# Patient Record
Sex: Female | Born: 1945 | Race: White | Hispanic: No | Marital: Married | Smoking: Former smoker
Health system: Southern US, Community
[De-identification: ages and names within clinical notes are randomized; demographics above are authoritative.]

## PROBLEM LIST (undated history)

## (undated) DIAGNOSIS — K824 Cholesterolosis of gallbladder: Secondary | ICD-10-CM

## (undated) DIAGNOSIS — Z79890 Hormone replacement therapy (postmenopausal): Secondary | ICD-10-CM

## (undated) DIAGNOSIS — J45909 Unspecified asthma, uncomplicated: Secondary | ICD-10-CM

## (undated) DIAGNOSIS — E119 Type 2 diabetes mellitus without complications: Secondary | ICD-10-CM

## (undated) DIAGNOSIS — L930 Discoid lupus erythematosus: Secondary | ICD-10-CM

## (undated) DIAGNOSIS — T7840XA Allergy, unspecified, initial encounter: Secondary | ICD-10-CM

## (undated) DIAGNOSIS — N281 Cyst of kidney, acquired: Secondary | ICD-10-CM

## (undated) DIAGNOSIS — E785 Hyperlipidemia, unspecified: Secondary | ICD-10-CM

## (undated) DIAGNOSIS — F419 Anxiety disorder, unspecified: Secondary | ICD-10-CM

## (undated) DIAGNOSIS — I2699 Other pulmonary embolism without acute cor pulmonale: Secondary | ICD-10-CM

## (undated) DIAGNOSIS — B029 Zoster without complications: Secondary | ICD-10-CM

## (undated) DIAGNOSIS — M5090 Cervical disc disorder, unspecified, unspecified cervical region: Secondary | ICD-10-CM

## (undated) DIAGNOSIS — R591 Generalized enlarged lymph nodes: Secondary | ICD-10-CM

## (undated) DIAGNOSIS — I10 Essential (primary) hypertension: Secondary | ICD-10-CM

## (undated) HISTORY — DX: Essential (primary) hypertension: I10

## (undated) HISTORY — PX: BREAST SURGERY: SHX581

## (undated) HISTORY — PX: APPENDECTOMY: SHX54

## (undated) HISTORY — PX: LAPAROSCOPIC HELLER MYOTOMY: SUR780

## (undated) HISTORY — DX: Cervical disc disorder, unspecified, unspecified cervical region: M50.90

## (undated) HISTORY — DX: Generalized enlarged lymph nodes: R59.1

## (undated) HISTORY — DX: Unspecified asthma, uncomplicated: J45.909

## (undated) HISTORY — DX: Zoster without complications: B02.9

## (undated) HISTORY — PX: TONSILLECTOMY AND ADENOIDECTOMY: SUR1326

## (undated) HISTORY — DX: Hormone replacement therapy: Z79.890

## (undated) HISTORY — DX: Cyst of kidney, acquired: N28.1

## (undated) HISTORY — DX: Hyperlipidemia, unspecified: E78.5

## (undated) HISTORY — PX: NECK SURGERY: SHX720

## (undated) HISTORY — DX: Anxiety disorder, unspecified: F41.9

## (undated) HISTORY — DX: Cholesterolosis of gallbladder: K82.4

## (undated) HISTORY — DX: Other pulmonary embolism without acute cor pulmonale: I26.99

## (undated) HISTORY — DX: Type 2 diabetes mellitus without complications: E11.9

## (undated) HISTORY — DX: Allergy, unspecified, initial encounter: T78.40XA

## (undated) HISTORY — PX: TUBAL LIGATION: SHX77

## (undated) HISTORY — DX: Discoid lupus erythematosus: L93.0

---

## 1997-11-30 ENCOUNTER — Other Ambulatory Visit: Admission: RE | Admit: 1997-11-30 | Discharge: 1997-11-30 | Payer: Self-pay | Admitting: Obstetrics

## 1999-01-10 ENCOUNTER — Other Ambulatory Visit: Admission: RE | Admit: 1999-01-10 | Discharge: 1999-01-10 | Payer: Self-pay | Admitting: Obstetrics

## 1999-12-26 ENCOUNTER — Other Ambulatory Visit: Admission: RE | Admit: 1999-12-26 | Discharge: 1999-12-26 | Payer: Self-pay | Admitting: Obstetrics

## 2002-07-17 HISTORY — PX: SPINE SURGERY: SHX786

## 2003-03-03 ENCOUNTER — Encounter: Payer: Self-pay | Admitting: Emergency Medicine

## 2003-03-03 ENCOUNTER — Ambulatory Visit (HOSPITAL_COMMUNITY): Admission: RE | Admit: 2003-03-03 | Discharge: 2003-03-03 | Payer: Self-pay | Admitting: Emergency Medicine

## 2003-05-27 ENCOUNTER — Inpatient Hospital Stay (HOSPITAL_COMMUNITY): Admission: RE | Admit: 2003-05-27 | Discharge: 2003-05-30 | Payer: Self-pay | Admitting: Neurosurgery

## 2003-06-08 ENCOUNTER — Encounter: Admission: RE | Admit: 2003-06-08 | Discharge: 2003-09-06 | Payer: Self-pay | Admitting: Neurosurgery

## 2005-07-01 ENCOUNTER — Ambulatory Visit (HOSPITAL_COMMUNITY): Admission: RE | Admit: 2005-07-01 | Discharge: 2005-07-01 | Payer: Self-pay | Admitting: Emergency Medicine

## 2005-07-03 ENCOUNTER — Ambulatory Visit: Payer: Self-pay

## 2005-07-19 ENCOUNTER — Ambulatory Visit: Payer: Self-pay | Admitting: *Deleted

## 2005-07-19 ENCOUNTER — Observation Stay (HOSPITAL_COMMUNITY): Admission: AD | Admit: 2005-07-19 | Discharge: 2005-07-20 | Payer: Self-pay | Admitting: *Deleted

## 2008-04-02 ENCOUNTER — Ambulatory Visit (HOSPITAL_COMMUNITY): Admission: RE | Admit: 2008-04-02 | Discharge: 2008-04-02 | Payer: Self-pay | Admitting: Emergency Medicine

## 2008-06-17 ENCOUNTER — Ambulatory Visit (HOSPITAL_COMMUNITY): Admission: RE | Admit: 2008-06-17 | Discharge: 2008-06-17 | Payer: Self-pay | Admitting: Obstetrics

## 2008-07-17 LAB — HM PAP SMEAR: HM Pap smear: NORMAL

## 2009-03-04 ENCOUNTER — Encounter: Admission: RE | Admit: 2009-03-04 | Discharge: 2009-03-04 | Payer: Self-pay | Admitting: Emergency Medicine

## 2009-10-15 LAB — HM MAMMOGRAPHY: HM Mammogram: NORMAL

## 2010-08-08 ENCOUNTER — Encounter: Payer: Self-pay | Admitting: Emergency Medicine

## 2010-09-02 ENCOUNTER — Other Ambulatory Visit: Payer: Self-pay | Admitting: Neurosurgery

## 2010-09-02 DIAGNOSIS — M542 Cervicalgia: Secondary | ICD-10-CM

## 2010-09-05 ENCOUNTER — Ambulatory Visit
Admission: RE | Admit: 2010-09-05 | Discharge: 2010-09-05 | Disposition: A | Discharge status: Home / Self Care | Payer: BC Managed Care – PPO | Source: Ambulatory Visit | Attending: Neurosurgery | Admitting: Neurosurgery

## 2010-09-05 DIAGNOSIS — M542 Cervicalgia: Secondary | ICD-10-CM

## 2010-12-02 NOTE — Discharge Summary (Signed)
Alison Friedman, Alison Friedman NO.:  [id number]   MEDICAL RECORD NO.:  [id number]          PATIENT TYPE:  INP   LOCATION:  3703                         FACILITY:  [facility]   PHYSICIAN:  Rollene Rotunda, M.D.   DATE OF BIRTH:  [birth year]   DATE OF ADMISSION:  07/19/2005  DATE OF DISCHARGE:  07/20/2005                                 DISCHARGE SUMMARY   PRIMARY CARDIOLOGIST:  Cecil Cranker, M.D.   PRIMARY CARE PHYSICIAN:  Brett Canales A. Cleta Alberts, M.D. [facility] on [address] in  [place].   DISCHARGE DIAGNOSES:  Atypical chest pain status post cardiac  catheterization on 07/20/05 by Dr. Antoine Poche.  Cardiac catheterization with  mild coronary plaque, normal left ventricular function, EF with 65% with  normal wall motion.   PAST MEDICAL HISTORY:  1.  Non insulin dependent diabetes.  2.  Restless leg syndrome.  3.  Hyperlipidemia.  4.  Neck and hand surgery.  5.  Lumpectomy.  6.  Cyst removal.  7.  Appendectomy.  8.  Post menopausal.  9.  History of DVT of right lower extremity.   ALLERGIES:  1.  PENICILLIN.  2.  LIPITOR.  3.  MACROBID.  4.  FISH.  5.  IVP DYE.   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   PROCEDURES THIS ADMISSION:  Cardiac catheterization.  Results as stated  above.   HOSPITAL COURSE:  [name] is a 65 year old Caucasian  female no known  coronary artery disease with diabetes, high blood pressure, hyperlipidemia  with history of tachycardia and diaphoresis, presyncopal episode with slight  chest discomfort who recently had a gated stress Myoview and developed chest  pain with diffuse ST depression.  The patient was seen by Dr. Glennon Hamilton  for evaluation of chest pain scheduled for outpatient cardiac  catheterization.  Results as stated above.  The patient tolerated the  procedure without complication.  Premedicated for IVP dye allergy.  The  patient is currently on bedrest status post cardiac catheterization.  If  no  complications from cath site and the patient ambulates after bedrest, the  patient will be discharged home.  Follow up with Dr. Cleta Alberts for a post cath  check and primary care issues.   MEDICATIONS AT DISCHARGE:  The patient was instructed to continue her  previous medications which include:  1.  Vytorin 10/20 mg p.o. daily.  2.  Xanax 0.5 mg p.o. p.r.n.  3.  Avandia 4 mg daily.  4.  Metformin 500 mg daily.  The patient was instructed to resume her      Metformin on 07/22/05.  5.  Zoloft 50 mg daily.  6.  Micardis HCT 40/12.5 mg p.o. daily.  7.  The patient has also been started on aspirin, enteric coated 325 mg      daily.  8.  Toprol XL 25 mg daily.   She is to initiate an exercise program and weight loss program for risk  factor modification.  Follow up with Dr. Cleta Alberts next week.  Continue her  diabetic diet.  Discharge instructions after  cardiac catheterization.  I  have also given the patient an article on cardiovascular disease in a  diabetic patient just for risk modifications.   LABORATORIES:  Lab work prior to discharge:  Sodium 4.0, BUN 14, creatinine  1.0.  H and H 11.2, hematocrit 32.7.   Duration of discharge 30 minutes.      Dorian Pod, NP    ______________________________  Rollene Rotunda, M.D.    MB/MEDQ  D:  07/20/2005  T:  07/20/2005  Job:  [id number]   cc:   Rollene Rotunda, M.D.  [address]  [address]  [place]  [place] [address]   [place]. Cleta Alberts, M.D.  Fax: [phone]

## 2010-12-02 NOTE — Cardiovascular Report (Signed)
NAMERHILYNN, PREYER NO.:  [id number]   MEDICAL RECORD NO.:  [id number]          PATIENT TYPE:  INP   LOCATION:  3703                         FACILITY:  [facility]   PHYSICIAN:  Rollene Rotunda, M.D.   DATE OF BIRTH:  [birth year]   DATE OF PROCEDURE:  07/20/2005  DATE OF DISCHARGE:                              CARDIAC CATHETERIZATION   PRIMARY CARE PHYSICIAN:  Dr. Lucilla Edin.   CARDIOLOGIST:  Cecil Cranker, M.D.   PROCEDURES:  Left heart catheterization, coronary arteriography.   INDICATIONS:  Patient with chest pain and Cardiolite suggesting anterior  ischemia.   PROCEDURES:  Note left heart catheterization performed via the right femoral  artery.  The artery was cannulated using arterial puncture.  A #6-French  arterial sheath was inserted via the modified Seldinger technique.  Preformed Judkins with a pigtail catheter were utilized.  The patient  tolerated the procedure well and left the lab in stable condition with good  results.   HEMODYNAMIC DATA:  LV 112/9, AL 112/85.   CORONARIES:  The left main was normal.  The LAD had mid 25% stenosis.  The  first diagonal was moderate sized and normal.  Second diagonal was moderate  size and normal.  The circumflex and the AV groove was normal.  OM1 and OM2  were small and normal.  OM3 was large and old.  The OM4 was moderate size  and normal.  Right coronary artery was large and dominant.  There were  diffuse liminal irregularities.  The PDA was large and normal.  Posterolateral x2 was moderate size and normal.   LEFT VENTRICULOGRAM:  The left ventriculogram was obtained in the RAO  projection.  EF was 65% with normal wall motion.   CONCLUSION:  Mild coronary plaque.  Normal left ventricular function.   PLAN:  No further cardiac workup is suggested.  The patient will follow with  Dr. Cleta Alberts for evaluation of nonanginal chest discomfort.           ______________________________  Rollene Rotunda,  M.D.     JH/MEDQ  D:  07/20/2005  T:  07/20/2005  Job:  [id number]   cc:   Brett Canales A. Cleta Alberts, M.D.  Fax: [phone]   E. Graceann Congress, M.D.  [address]  [address]  [place]  [place] [address]

## 2010-12-16 DIAGNOSIS — B029 Zoster without complications: Secondary | ICD-10-CM

## 2010-12-16 HISTORY — DX: Zoster without complications: B02.9

## 2011-02-25 LAB — HM DIABETES FOOT EXAM

## 2011-04-26 LAB — LIPID PANEL
Cholesterol: 234 mg/dL — AB (ref 0–200)
HDL: 37 mg/dL (ref 35–70)
LDL Cholesterol: 141 mg/dL
LDl/HDL Ratio: 6.3
Triglycerides: 278 mg/dL — AB (ref 40–160)

## 2011-04-26 LAB — BASIC METABOLIC PANEL: Glucose: 140 mg/dL

## 2011-04-26 LAB — HEMOGLOBIN A1C: Hgb A1c MFr Bld: 6.5 % — AB (ref 4.0–6.0)

## 2011-05-08 ENCOUNTER — Emergency Department (HOSPITAL_COMMUNITY): Payer: Medicare Other

## 2011-05-08 ENCOUNTER — Inpatient Hospital Stay (HOSPITAL_COMMUNITY)
Admission: EM | Admit: 2011-05-08 | Discharge: 2011-05-11 | DRG: 394 | Disposition: A | Discharge status: Home / Self Care | Payer: Medicare Other | Attending: Internal Medicine | Admitting: Internal Medicine

## 2011-05-08 DIAGNOSIS — G2581 Restless legs syndrome: Secondary | ICD-10-CM | POA: Diagnosis present

## 2011-05-08 DIAGNOSIS — K559 Vascular disorder of intestine, unspecified: Principal | ICD-10-CM | POA: Diagnosis present

## 2011-05-08 DIAGNOSIS — I10 Essential (primary) hypertension: Secondary | ICD-10-CM | POA: Diagnosis present

## 2011-05-08 DIAGNOSIS — E785 Hyperlipidemia, unspecified: Secondary | ICD-10-CM | POA: Diagnosis present

## 2011-05-08 DIAGNOSIS — D62 Acute posthemorrhagic anemia: Secondary | ICD-10-CM | POA: Diagnosis present

## 2011-05-08 DIAGNOSIS — I2510 Atherosclerotic heart disease of native coronary artery without angina pectoris: Secondary | ICD-10-CM | POA: Diagnosis present

## 2011-05-08 DIAGNOSIS — E119 Type 2 diabetes mellitus without complications: Secondary | ICD-10-CM | POA: Diagnosis present

## 2011-05-08 LAB — URINALYSIS, ROUTINE W REFLEX MICROSCOPIC
Bilirubin Urine: NEGATIVE
Glucose, UA: NEGATIVE mg/dL
Hgb urine dipstick: NEGATIVE
Ketones, ur: NEGATIVE mg/dL
Leukocytes, UA: NEGATIVE
Nitrite: NEGATIVE
Protein, ur: NEGATIVE mg/dL
Specific Gravity, Urine: 1.011 (ref 1.005–1.030)
Urobilinogen, UA: 0.2 mg/dL (ref 0.0–1.0)
pH: 5 (ref 5.0–8.0)

## 2011-05-08 LAB — DIFFERENTIAL
Basophils Absolute: 0 10*3/uL (ref 0.0–0.1)
Basophils Relative: 0 % (ref 0–1)
Eosinophils Absolute: 0.1 10*3/uL (ref 0.0–0.7)
Eosinophils Relative: 1 % (ref 0–5)
Lymphocytes Relative: 13 % (ref 12–46)
Lymphs Abs: 1.9 10*3/uL (ref 0.7–4.0)
Monocytes Absolute: 1.6 10*3/uL — ABNORMAL HIGH (ref 0.1–1.0)
Monocytes Relative: 11 % (ref 3–12)
Neutro Abs: 10.5 10*3/uL — ABNORMAL HIGH (ref 1.7–7.7)
Neutrophils Relative %: 75 % (ref 43–77)

## 2011-05-08 LAB — GLUCOSE, CAPILLARY: Glucose-Capillary: 151 mg/dL — ABNORMAL HIGH (ref 70–99)

## 2011-05-08 LAB — CBC
HCT: 42.1 % (ref 36.0–46.0)
Hemoglobin: 14.3 g/dL (ref 12.0–15.0)
MCH: 31 pg (ref 26.0–34.0)
MCHC: 34 g/dL (ref 30.0–36.0)
MCV: 91.1 fL (ref 78.0–100.0)
Platelets: 360 10*3/uL (ref 150–400)
RBC: 4.62 MIL/uL (ref 3.87–5.11)
RDW: 12.9 % (ref 11.5–15.5)
WBC: 14 10*3/uL — ABNORMAL HIGH (ref 4.0–10.5)

## 2011-05-08 LAB — COMPREHENSIVE METABOLIC PANEL
ALT: 17 U/L (ref 0–35)
AST: 17 U/L (ref 0–37)
Albumin: 4.5 g/dL (ref 3.5–5.2)
Alkaline Phosphatase: 124 U/L — ABNORMAL HIGH (ref 39–117)
BUN: 17 mg/dL (ref 6–23)
CO2: 24 mEq/L (ref 19–32)
Calcium: 10.1 mg/dL (ref 8.4–10.5)
Chloride: 98 mEq/L (ref 96–112)
Creatinine, Ser: 0.88 mg/dL (ref 0.50–1.10)
GFR calc Af Amer: 78 mL/min — ABNORMAL LOW (ref >90)
GFR calc non Af Amer: 67 mL/min — ABNORMAL LOW (ref >90)
Glucose, Bld: 142 mg/dL — ABNORMAL HIGH (ref 70–99)
Potassium: 4 mEq/L (ref 3.5–5.1)
Sodium: 135 mEq/L (ref 135–145)
Total Bilirubin: 0.9 mg/dL (ref 0.3–1.2)
Total Protein: 8.1 g/dL (ref 6.0–8.3)

## 2011-05-08 LAB — ABO/RH: ABO/RH(D): O NEG

## 2011-05-08 LAB — OCCULT BLOOD, POC DEVICE: Fecal Occult Bld: POSITIVE

## 2011-05-08 NOTE — H&P (Unsigned)
Alison Friedman, Alison Friedman NO.:  [id number]  MEDICAL RECORD NO.:  [id number]  LOCATION:  WLED                         FACILITY:  [facility]  PHYSICIAN:  Valetta Close, M.D.   DATE OF BIRTH:  [birth year]  DATE OF ADMISSION:  05/08/2011 DATE OF DISCHARGE:                             HISTORY & PHYSICAL   CHIEF COMPLAINT:  Rectal bleeding and abdominal pain.  HISTORY OF PRESENT ILLNESS:  This is a 65 year old with a past medical history of diabetes, hypertension, and nonobstructive coronary artery disease who went to dinner last night with some friends and shortly after finishing dinner developed severe abdominal cramps, diaphoresis, and heavy episode of diarrhea.  She had diarrhea promptly after dinner, and she tried to go home, but could only make as far as her daughter's house where she continued to have diarrhea.  She had 3 more episodes there, continued to feel weak, went home, and then had diarrhea at home since 2 in the morning, but the diarrhea is now completely bloody. There are also some big clots in the stool.  She also continues to have left-sided abdominal pain with nausea, but no vomiting.  She has had subjective fevers and chills.  Nobody else who has eaten the same food has had any symptoms.  She is generally constipated; this the first time she has had a problem like this.  PAST MEDICAL HISTORY:  Diabetes on oral medications, hypertension, coronary artery disease that was deemed nonobstructive on a cath in 2007, restless leg syndrome, hyperlipidemia, a remote history of a right lower extremity DVT, and she is status post appendectomy.  SOCIAL HISTORY:  She is married.  No current tobacco and no drugs, though she is a remote smoker.  ALLERGIES:  SHE HAS ALLERGIES TO PENICILLIN, MACROBID, SHELLFISH, IV DYE, AND LIPITOR, BUT SHE HAS TOLERATED VYTORIN IN THE PAST.  CODE STATUS:  She is a full code.  FAMILY HISTORY:  Negative for colon cancer or early heart  disease.  REVIEW OF SYSTEMS:  Ten point review of systems was performed and negative except for as per the HPI.  She had her last colonoscopy 5 years ago and had some polyps removed.  She is scheduled to have another one soon.  PHYSICAL EXAMINATION:  VITAL SIGNS:  Temperature 99.4, blood pressure 120/64, heart rate 67, O2 saturation 97% on room air, and respiratory rate is 18. GENERAL:  She appears her stated age and is in maybe some mild distress. HEENT:  Eyes are anicteric.  She has moist oral mucosa. NECK:  No JVD.  No carotid bruits. LUNGS:  Clear to auscultation bilaterally. CARDIAC EXAMINATION:  Regular rate and rhythm.  No murmur. ABDOMEN:  Bowel sounds hyperactive.  She has moderate tenderness to palpation in the left upper quadrant with no rigidity, no rebound tenderness, and no guarding. EXTREMITIES:  No edema.  She has no CVA tenderness. RECTAL:  Exam is deferred.  It was done by the ER physician and was guaiac positive. EXTREMITIES:  No edema. NEUROLOGIC:  She is alert and oriented x3.  Cranial nerves 2 through 12 intact. PSYCHIATRIC:  Nonfocal.  LABORATORY DATA:  White count 14, hemoglobin 14, hematocrit 42, platelets  360, and MCV 91.  A UA is negative.  Sodium 135, potassium 4, chloride 98, bicarb 24, BUN 17, creatinine 0.88, glucose 142, calcium 10.1, total protein is 8, albumin 4.5, AST 17, ALT 17, alk phos 124, and bilirubin 0.9.  A CT shows left colon colitis from the splenic flexure to the mid sigmoid consistent with either infectious or ischemic colitis.  ASSESSMENT AND PLAN: 1. Colitis.  The question is whether this is infectious or ischemic.     I will treat with IV Cipro and Flagyl, pain and nausea medications,     IV fluids, and supportive care.  We will see how she is doing     tomorrow and advance diet as tolerated.  Based on how she     progresses, we will determine whether we need to consult GI to do     colonoscopy inpatient, or to do it in the  near future.  I will keep     her on aspirin in case this is ischemic colitis, and I have decided     to put her on telemetry because atrial fibrillation could be a     cause of ischemic colitis, so I will make sure she is not having     any paroxysmally atrial fibrillation. 2. Diabetes.  I am going to hold her medications for now and put her     on a sensitive sliding scale given that she is not eating.     Medications she takes Micardis, aspirin, Januvia, and metformin.  Approximate length of time spent on this admission was approximately 40 minutes.     Valetta Close, M.D.     JC/MEDQ  D:  05/08/2011  T:  05/08/2011  Job:  [id number]  cc:   Dr. __________ [name]  Dr. Rhetta Mura

## 2011-05-09 LAB — CBC
HCT: 35.3 % — ABNORMAL LOW (ref 36.0–46.0)
Hemoglobin: 11.5 g/dL — ABNORMAL LOW (ref 12.0–15.0)
MCHC: 32.6 g/dL (ref 30.0–36.0)
RBC: 3.8 MIL/uL — ABNORMAL LOW (ref 3.87–5.11)
WBC: 12.2 10*3/uL — ABNORMAL HIGH (ref 4.0–10.5)

## 2011-05-09 LAB — BASIC METABOLIC PANEL
Chloride: 104 mEq/L (ref 96–112)
GFR calc Af Amer: 84 mL/min — ABNORMAL LOW (ref >90)
GFR calc non Af Amer: 72 mL/min — ABNORMAL LOW (ref >90)
Potassium: 3.7 mEq/L (ref 3.5–5.1)
Sodium: 136 mEq/L (ref 135–145)

## 2011-05-09 LAB — LIPID PANEL
Cholesterol: 156 mg/dL (ref 0–200)
HDL: 37 mg/dL — ABNORMAL LOW (ref >39)
LDL Cholesterol: 87 mg/dL (ref 0–99)
Triglycerides: 158 mg/dL — ABNORMAL HIGH (ref <150)

## 2011-05-09 LAB — FERRITIN: Ferritin: 55 ng/mL (ref 10–291)

## 2011-05-09 LAB — SEDIMENTATION RATE: Sed Rate: 20 mm/hr (ref 0–22)

## 2011-05-09 LAB — VITAMIN B12: Vitamin B-12: 266 pg/mL (ref 211–911)

## 2011-05-09 LAB — GLUCOSE, CAPILLARY
Glucose-Capillary: 106 mg/dL — ABNORMAL HIGH (ref 70–99)
Glucose-Capillary: 111 mg/dL — ABNORMAL HIGH (ref 70–99)
Glucose-Capillary: 128 mg/dL — ABNORMAL HIGH (ref 70–99)
Glucose-Capillary: 145 mg/dL — ABNORMAL HIGH (ref 70–99)
Glucose-Capillary: 167 mg/dL — ABNORMAL HIGH (ref 70–99)

## 2011-05-10 LAB — BASIC METABOLIC PANEL
CO2: 24 mEq/L (ref 19–32)
Calcium: 8.7 mg/dL (ref 8.4–10.5)
Creatinine, Ser: 0.86 mg/dL (ref 0.50–1.10)
GFR calc non Af Amer: 69 mL/min — ABNORMAL LOW (ref >90)
Glucose, Bld: 154 mg/dL — ABNORMAL HIGH (ref 70–99)

## 2011-05-10 LAB — CBC
MCH: 30.3 pg (ref 26.0–34.0)
MCHC: 32.7 g/dL (ref 30.0–36.0)
MCV: 92.7 fL (ref 78.0–100.0)
Platelets: 283 10*3/uL (ref 150–400)
RBC: 3.7 MIL/uL — ABNORMAL LOW (ref 3.87–5.11)

## 2011-05-10 LAB — GLUCOSE, CAPILLARY: Glucose-Capillary: 94 mg/dL (ref 70–99)

## 2011-05-11 LAB — CBC
Platelets: 276 10*3/uL (ref 150–400)
RBC: 3.57 MIL/uL — ABNORMAL LOW (ref 3.87–5.11)
RDW: 12.8 % (ref 11.5–15.5)
WBC: 8.7 10*3/uL (ref 4.0–10.5)

## 2011-05-11 LAB — DIFFERENTIAL
Eosinophils Absolute: 0.7 10*3/uL (ref 0.0–0.7)
Lymphocytes Relative: 35 % (ref 12–46)
Monocytes Absolute: 0.8 10*3/uL (ref 0.1–1.0)
Neutrophils Relative %: 48 % (ref 43–77)

## 2011-05-11 LAB — BASIC METABOLIC PANEL
Chloride: 107 mEq/L (ref 96–112)
GFR calc Af Amer: 83 mL/min — ABNORMAL LOW (ref >90)
Potassium: 3.8 mEq/L (ref 3.5–5.1)
Sodium: 139 mEq/L (ref 135–145)

## 2011-05-11 LAB — MAGNESIUM: Magnesium: 1.9 mg/dL (ref 1.5–2.5)

## 2011-05-11 LAB — GLUCOSE, CAPILLARY
Glucose-Capillary: 120 mg/dL — ABNORMAL HIGH (ref 70–99)
Glucose-Capillary: 116 mg/dL — ABNORMAL HIGH (ref 70–99)

## 2011-05-12 LAB — TYPE AND SCREEN
ABO/RH(D): O NEG
Antibody Screen: NEGATIVE
Unit division: 0

## 2011-05-13 NOTE — Discharge Summary (Signed)
[name], BLOK NO.:  [id number]  MEDICAL RECORD NO.:  [id number]  LOCATION:  1428                         FACILITY:  [facility]  PHYSICIAN:  Ramiro Harvest, MD    DATE OF BIRTH:  Aug 25, 1945  DATE OF ADMISSION:  05/08/2011 DATE OF DISCHARGE:  05/11/2011                        DISCHARGE SUMMARY    PRIMARY CARE PHYSICIAN:  Brett Canales A. Cleta Alberts, M.D. of [facility].  GASTROENTEROLOGIST:  Dr. Janna [facility] [place].  DISCHARGE DIAGNOSES: 1. Ischemic colitis, improved. 2. Acute blood loss anemia secondary to ischemic colitis, resolved. 3. Well-controlled type 2 diabetes with a hemoglobin A1c of 6.6. 4. Hypertension. 5. History of nonobstructive coronary artery disease. 6. Restless legs syndrome. 7. Hyperlipidemia. 8. Remote history of right lower extremity deep venous thrombosis. 9. Status post appendectomy.  DISCHARGE MEDICATIONS: 1. Ciprofloxacin 500 mg p.o. b.i.d. x12 days. 2. Flagyl 500 mg p.o. t.i.d. x12 days. 3. Colace 100 mg p.o. b.i.d. 4. Oxycodone 5 mg p.o. q.4 hours p.r.n. pain. 5. Zofran 4 mg p.o. q.6 hours p.r.n. 6. Januvia 100 mg p.o. daily. 7. Metformin XR 500 mg p.o. b.i.d. 8. Pravastatin 40 mg p.o. daily. 9. Vitamin D3 of 2000 international units p.o. daily.  DISPOSITION AND FOLLOWUP:  The patient will be discharged home.  The patient is to follow up with PCP in 1 week.  On follow up, the patient's blood pressure will need to be reassessed.  The patient's blood pressure medications were held during the hospitalization secondary to borderline hypotension and secondary to ischemic colitis and as such this was discontinued on discharge.  This need to be reassessed per PCP and decided whether to resume.  The patient will need a BMET on followup. The patient is also to follow up with her gastroenterologist 1 week post discharge for followup on her ischemic colitis.  The patient has been discharged  on 12 more days of ciprofloxacin and Flagyl to complete a 2- week course of antibiotic therapy.  The patient may benefit from a colonoscopy post treatment.  When the patient follows with Dr. Rhetta Mura, the patient's aspirin has been discontinued.  It will be deferred to her gastroenterologist as to when to resume the aspirin.  CONSULTATIONS DONE:  None.  PROCEDURES PERFORMED:  CT of the abdomen and pelvis done on May 08, 2011, shows a long segment colitis involving the left colon from the splenic flexure to the midsigmoid.  Terminal ileum and rectum appear uninvolved.  This appearance is most suggestive of an infectious or ischemic colitis.  No free air.  BRIEF ADMISSION HISTORY AND PHYSICAL:  [name] is a 65 year old Caucasian female with past medical history of diabetes, hypertension, nonobstructive coronary artery disease, who went to dinner the night prior to admission with some friends and shortly after finishing the dinner, developed some severe abdominal cramps, diaphoresis, and heavy episodes of diarrhea.  The patient had diarrhea promptly after dinner. She tried to go home, but could only make it as far as her daughter's house where she continued to have diarrhea.  She had 3 more episodes, then continued to feel weak and went home and then had diarrhea at home. Since 2:00  a.m. on the morning of admission, the patient's diarrhea had completely become bloody.  The patient also had some big clots in the stool.  She also continued to have some left-sided abdominal pain with nausea, but no vomiting.  The patient had some subjective fevers and chills.  Nobody else who had eaten the same meal as the patient did had any symptoms.  The patient states she is generally constipated and for the first time that she has had a problem like this.  For the rest of admission history and physical, please see H and P dictated by Dr. Noel Gerold of job [phone].  HOSPITAL COURSE: 1. Ischemic  colitis.  The patient was admitted with colitis.  It was     felt differential included infectious versus ischemic colitis.  The     patient was placed empirically on IV Cipro and Flagyl as well as IV     fluids, antiemetics, and supportive care.  The patient was     monitored.  The patient did not have any further bloody stools     during the hospitalization, nor did she have any further diarrhea.     Stool studies were sent, but are pending as the patient had no     stools.  C. difficile PCR was also sent, however, the patient did     not have any further stools during the hospitalization.  As such,     results of that were unobtainable.  The patient improved, however,     clinically.  Abdominal pain improved.  She was transitioned from     NPO to clear liquids and subsequently transitioned to a diet, which     she tolerated.  Her abdominal pain improved, did not have any     emesis and by day of discharge, the patient was in stable and     improved condition.  The patient will be discharged home on 12 more     days of oral ciprofloxacin and Flagyl to complete a 2-week course     of antibiotic therapy.  The patient will be discharged in stable     and improved condition.  The patient's aspirin was discontinued and     resumption of aspirin will be deferred to her gastroenterologist. 2. Acute blood loss anemia secondary to ischemic colitis. 3. Hypertension.  On admission, the patient was noted to have a     borderline blood pressure and as such, her blood pressure     medication was not restarted during this hospitalization.  The     patient's blood pressure medications were held.  Her systolic blood     pressure remained slightly in the low 100s.  The patient, however,     was asymptomatic.  The patient will be discharged home off     antihypertensive medications.  She will need to follow up with her     PCP.  We will reassess her blood pressure regimen and decide as to     when to  resume her blood pressure regimen.  The patient will be     discharged in stable and improved condition. 4. On the day of discharge, vital signs:  temperature 97.6, pulse of     51, respirations 18, blood pressure 116/69, satting 99% on room     air.  DISCHARGE LABS:  CBC:  White count 8.7, hemoglobin 10.9, hematocrit 33.8, platelet count of 276 with an ANC of 4.2, magnesium level of 1.9. BMET:  Sodium 139,  potassium 3.8, chloride 107, bicarb 25, glucose 134, BUN 14, creatinine 0.84, and a calcium of 8.8.  It was a pleasure taking care of Ms. Humble.     Ramiro Harvest, MD     DT/MEDQ  D:  05/11/2011  T:  05/11/2011  Job:  [id number]  cc:   Brett Canales A. Cleta Alberts, M.D. Fax: [phone]  Dr. Janna Arch  Electronically Signed by Ramiro Harvest MD on 05/13/2011 05:28:10 PM

## 2011-05-15 LAB — CULTURE, BLOOD (ROUTINE X 2)
Culture  Setup Time: 201210230842
Culture: NO GROWTH

## 2011-06-29 ENCOUNTER — Ambulatory Visit (INDEPENDENT_AMBULATORY_CARE_PROVIDER_SITE_OTHER): Payer: Medicare Other

## 2011-06-29 DIAGNOSIS — K5190 Ulcerative colitis, unspecified, without complications: Secondary | ICD-10-CM

## 2011-07-24 DIAGNOSIS — D126 Benign neoplasm of colon, unspecified: Secondary | ICD-10-CM | POA: Diagnosis not present

## 2011-07-24 DIAGNOSIS — Z86010 Personal history of colonic polyps: Secondary | ICD-10-CM | POA: Diagnosis not present

## 2011-07-24 DIAGNOSIS — K55059 Acute (reversible) ischemia of intestine, part and extent unspecified: Secondary | ICD-10-CM | POA: Diagnosis not present

## 2011-08-05 ENCOUNTER — Encounter: Payer: Self-pay | Admitting: Emergency Medicine

## 2011-08-22 ENCOUNTER — Encounter: Payer: Self-pay | Admitting: *Deleted

## 2011-08-22 DIAGNOSIS — J45909 Unspecified asthma, uncomplicated: Secondary | ICD-10-CM | POA: Insufficient documentation

## 2011-08-22 DIAGNOSIS — E785 Hyperlipidemia, unspecified: Secondary | ICD-10-CM | POA: Insufficient documentation

## 2011-08-22 DIAGNOSIS — F419 Anxiety disorder, unspecified: Secondary | ICD-10-CM | POA: Insufficient documentation

## 2011-08-22 DIAGNOSIS — E119 Type 2 diabetes mellitus without complications: Secondary | ICD-10-CM | POA: Insufficient documentation

## 2011-08-22 DIAGNOSIS — I10 Essential (primary) hypertension: Secondary | ICD-10-CM | POA: Insufficient documentation

## 2011-09-05 ENCOUNTER — Ambulatory Visit: Payer: Self-pay | Admitting: Emergency Medicine

## 2011-10-01 ENCOUNTER — Ambulatory Visit (INDEPENDENT_AMBULATORY_CARE_PROVIDER_SITE_OTHER): Payer: Medicare Other | Admitting: Emergency Medicine

## 2011-10-01 DIAGNOSIS — E119 Type 2 diabetes mellitus without complications: Secondary | ICD-10-CM | POA: Diagnosis not present

## 2011-10-01 DIAGNOSIS — R05 Cough: Secondary | ICD-10-CM | POA: Diagnosis not present

## 2011-10-01 DIAGNOSIS — J45909 Unspecified asthma, uncomplicated: Secondary | ICD-10-CM

## 2011-10-01 DIAGNOSIS — R059 Cough, unspecified: Secondary | ICD-10-CM

## 2011-10-01 DIAGNOSIS — IMO0001 Reserved for inherently not codable concepts without codable children: Secondary | ICD-10-CM | POA: Diagnosis not present

## 2011-10-01 DIAGNOSIS — J329 Chronic sinusitis, unspecified: Secondary | ICD-10-CM

## 2011-10-01 DIAGNOSIS — R5382 Chronic fatigue, unspecified: Secondary | ICD-10-CM | POA: Diagnosis not present

## 2011-10-01 MED ORDER — HYDROCOD POLST-CHLORPHEN POLST 10-8 MG/5ML PO LQCR: 5.0000 mL | Freq: Two times a day (BID) | ORAL | Status: DC | PRN

## 2011-10-01 MED ORDER — IPRATROPIUM BROMIDE 0.02 % IN SOLN
0.5000 mg | Freq: Once | RESPIRATORY_TRACT | Status: AC
  Administered 2011-10-01: 0.5 mg via RESPIRATORY_TRACT

## 2011-10-01 MED ORDER — CEPHALEXIN 500 MG PO TABS: 500.0000 mg | ORAL_TABLET | Freq: Three times a day (TID) | ORAL | Status: AC

## 2011-10-01 MED ORDER — FLUTICASONE PROPIONATE 50 MCG/ACT NA SUSP: 2.0000 | Freq: Every day | NASAL | Status: DC

## 2011-10-01 MED ORDER — ALBUTEROL SULFATE (2.5 MG/3ML) 0.083% IN NEBU
2.5000 mg | INHALATION_SOLUTION | Freq: Once | RESPIRATORY_TRACT | Status: AC
  Administered 2011-10-01: 2.5 mg via RESPIRATORY_TRACT

## 2011-10-01 MED ORDER — ALBUTEROL SULFATE HFA 108 (90 BASE) MCG/ACT IN AERS: 2.0000 | INHALATION_SPRAY | RESPIRATORY_TRACT | Status: DC | PRN

## 2011-10-02 NOTE — Progress Notes (Signed)
  Subjective:    Patient ID: Alison Friedman, female    DOB: 04-Sep-1945, 66 y.o.   MRN: [id number]  HPI patient presents with head congestion sore throat and cough. She has a history of allergies and reactive airways disease. She has presented in the past with acute airway issues and required nebs in the past.    Review of Systems other medical issues are currently under treatment and control this as an acute illness for her     Objective:   Physical Exam  Constitutional: She appears well-developed.  HENT:  Right Ear: External ear normal.  Left Ear: External ear normal.  Mouth/Throat: Oropharynx is clear and moist.       The nose is congested. There is purulent nasal drainage.  Eyes: Pupils are equal, round, and reactive to light.  Neck: No thyromegaly present.       There is a 1 x 1 cm right posterior cervical node  Cardiovascular: Normal rate and regular rhythm.   Pulmonary/Chest: No respiratory distress. She has wheezes. She has rales. She exhibits no tenderness.  Abdominal: Soft.          Assessment & Plan:   Assessment is allergic rhinitis with secondary sinusitis and reactive airways disease. Reglan given neb treatment and then get her on antibiotics and nasal sprays to help with her allergy symptoms

## 2011-10-10 ENCOUNTER — Ambulatory Visit (INDEPENDENT_AMBULATORY_CARE_PROVIDER_SITE_OTHER): Payer: Medicare Other | Admitting: Emergency Medicine

## 2011-10-10 DIAGNOSIS — R5382 Chronic fatigue, unspecified: Secondary | ICD-10-CM | POA: Diagnosis not present

## 2011-10-10 DIAGNOSIS — G9332 Myalgic encephalomyelitis/chronic fatigue syndrome: Secondary | ICD-10-CM

## 2011-10-10 DIAGNOSIS — E119 Type 2 diabetes mellitus without complications: Secondary | ICD-10-CM

## 2011-10-10 DIAGNOSIS — J45909 Unspecified asthma, uncomplicated: Secondary | ICD-10-CM | POA: Diagnosis not present

## 2011-10-10 DIAGNOSIS — J309 Allergic rhinitis, unspecified: Secondary | ICD-10-CM | POA: Insufficient documentation

## 2011-10-10 DIAGNOSIS — R05 Cough: Secondary | ICD-10-CM | POA: Diagnosis not present

## 2011-10-10 DIAGNOSIS — R059 Cough, unspecified: Secondary | ICD-10-CM | POA: Diagnosis not present

## 2011-10-10 DIAGNOSIS — IMO0001 Reserved for inherently not codable concepts without codable children: Secondary | ICD-10-CM | POA: Diagnosis not present

## 2011-10-10 DIAGNOSIS — M791 Myalgia, unspecified site: Secondary | ICD-10-CM

## 2011-10-10 DIAGNOSIS — E785 Hyperlipidemia, unspecified: Secondary | ICD-10-CM | POA: Diagnosis not present

## 2011-10-10 LAB — POCT SEDIMENTATION RATE: POCT SED RATE: 11 mm/hr (ref 0–22)

## 2011-10-10 MED ORDER — HYDROCODONE-ACETAMINOPHEN 5-325 MG PO TABS: 1.0000 | ORAL_TABLET | Freq: Four times a day (QID) | ORAL | Status: AC | PRN

## 2011-10-10 MED ORDER — OLOPATADINE HCL 0.6 % NA SOLN: NASAL | Status: DC

## 2011-10-10 NOTE — Progress Notes (Signed)
  Subjective:    Patient ID: Alison Friedman, female    DOB: 09/21/1945, 66 y.o.   MRN: [id number] Patient patient in for followup of her diabetes she overall is doing well except for pain in her thighs. She feels like the metformin is what is causing her discomfort. She's had a recurrence of her allergy symptoms. She feels her skin or voice. She's not had any cough. She seems to be recovering from her recent respiratory illness. HPI    Review of Systems no chest pain no shortness of breath. No bowel symptoms. No GU symptoms. She also has some aching in her legs as she assumed was secondary to diabetes drugs but they have persisted despite not taking them.     Objective:   Physical Exam  Constitutional: She appears well-nourished.  HENT:  Head: Normocephalic and atraumatic.  Right Ear: External ear normal.  Left Ear: External ear normal.  Mouth/Throat: Oropharynx is clear and moist.       She has a husky voice          Assessment & Plan:   Patient states she is having trouble with the metformin. We'll see what her hemoglobin A1c and glucose are make a decision about medications at that time. Also gave her a limited prescription of hydrocodone to take for leg pain or cough

## 2011-10-11 LAB — LIPID PANEL
LDL Cholesterol: 127 mg/dL — ABNORMAL HIGH (ref 0–99)
Triglycerides: 260 mg/dL — ABNORMAL HIGH (ref <150)
VLDL: 52 mg/dL — ABNORMAL HIGH (ref 0–40)

## 2011-10-11 LAB — CK: Total CK: 68 U/L (ref 7–177)

## 2011-10-12 LAB — ALDOLASE: Aldolase: 6 U/L (ref <=8.1)

## 2011-11-10 DIAGNOSIS — N6489 Other specified disorders of breast: Secondary | ICD-10-CM | POA: Diagnosis not present

## 2011-11-10 LAB — HM MAMMOGRAPHY

## 2012-01-23 ENCOUNTER — Ambulatory Visit (INDEPENDENT_AMBULATORY_CARE_PROVIDER_SITE_OTHER): Payer: Medicare Other | Admitting: Emergency Medicine

## 2012-01-23 ENCOUNTER — Encounter: Payer: Self-pay | Admitting: Emergency Medicine

## 2012-01-23 VITALS — BP 138/79 | HR 57 | Temp 97.5°F | Resp 16 | Ht 63.0 in | Wt 174.4 lb

## 2012-01-23 DIAGNOSIS — I10 Essential (primary) hypertension: Secondary | ICD-10-CM

## 2012-01-23 DIAGNOSIS — E119 Type 2 diabetes mellitus without complications: Secondary | ICD-10-CM

## 2012-01-23 DIAGNOSIS — E782 Mixed hyperlipidemia: Secondary | ICD-10-CM

## 2012-01-23 DIAGNOSIS — K5792 Diverticulitis of intestine, part unspecified, without perforation or abscess without bleeding: Secondary | ICD-10-CM

## 2012-01-23 DIAGNOSIS — F411 Generalized anxiety disorder: Secondary | ICD-10-CM | POA: Diagnosis not present

## 2012-01-23 DIAGNOSIS — F439 Reaction to severe stress, unspecified: Secondary | ICD-10-CM

## 2012-01-23 LAB — GLUCOSE, POCT (MANUAL RESULT ENTRY): POC Glucose: 110 mg/dl — AB (ref 70–99)

## 2012-01-23 LAB — CBC WITH DIFFERENTIAL/PLATELET
Basophils Absolute: 0 10*3/uL (ref 0.0–0.1)
Eosinophils Relative: 9 % — ABNORMAL HIGH (ref 0–5)
Lymphocytes Relative: 38 % (ref 12–46)
MCV: 87.9 fL (ref 78.0–100.0)
Platelets: 420 10*3/uL — ABNORMAL HIGH (ref 150–400)
RDW: 13.7 % (ref 11.5–15.5)
WBC: 6.3 10*3/uL (ref 4.0–10.5)

## 2012-01-23 LAB — POCT GLYCOSYLATED HEMOGLOBIN (HGB A1C): Hemoglobin A1C: 6.6

## 2012-01-23 LAB — LIPID PANEL: HDL: 38 mg/dL — ABNORMAL LOW (ref >39)

## 2012-01-23 MED ORDER — ALPRAZOLAM 0.5 MG PO TABS: 0.5000 mg | ORAL_TABLET | ORAL | Status: DC | PRN

## 2012-01-23 MED ORDER — CIPROFLOXACIN HCL 500 MG PO TABS: 500.0000 mg | ORAL_TABLET | Freq: Two times a day (BID) | ORAL | Status: AC

## 2012-01-23 MED ORDER — TELMISARTAN 40 MG PO TABS: 40.0000 mg | ORAL_TABLET | Freq: Every day | ORAL | Status: DC

## 2012-01-23 NOTE — Progress Notes (Signed)
  Subjective:    Patient ID: Alison Friedman, female    DOB: July 12, 1946, 66 y.o.   MRN: [id number]  HPI patient is in for her 3 month check. She overall is doing well. She is trying to lose weight and exercise more and he better. She has no complaints of chest pain or shortness of breath but is having some mild right upper abdominal pain. This isn't felt to be due to a low-grade diverticulitis in the past and has responded to antibiotics in the past.    Review of Systems     Objective:   Physical Exam  Constitutional: She appears well-developed and well-nourished.  HENT:  Head: Normocephalic.  Eyes: Pupils are equal, round, and reactive to light.  Neck: No tracheal deviation present. No thyromegaly present.  Cardiovascular: Normal rate, regular rhythm and normal heart sounds.   Pulmonary/Chest: Breath sounds normal. No respiratory distress. She has no wheezes. She has no rales.  Abdominal:       The abdomen is soft there is tenderness deep in the right upper abdomen no rebound is noted    Results for orders placed in visit on 01/23/12  GLUCOSE, POCT (MANUAL RESULT ENTRY)      Component Value Range   POC Glucose 110 (*) 70 - 99 mg/dl  POCT GLYCOSYLATED HEMOGLOBIN (HGB A1C)      Component Value Range   Hemoglobin A1C 6.6          Assessment & Plan:  I have placed the patient on Cipro 500 twice a day for the next week since she has responded to this well in the past. Recheck 3 months. Her hemoglobin A1c has improved however she has not taken her Januvia for financial reasons.

## 2012-01-25 MED ORDER — PRAVASTATIN SODIUM 40 MG PO TABS: 40.0000 mg | ORAL_TABLET | Freq: Every day | ORAL | Status: DC

## 2012-01-25 NOTE — Addendum Note (Signed)
Addended by: Johnnette Litter on: 01/25/2012 07:22 PM   Modules accepted: Orders

## 2012-03-13 ENCOUNTER — Ambulatory Visit
Admission: RE | Admit: 2012-03-13 | Discharge: 2012-03-13 | Disposition: A | Discharge status: Home / Self Care | Payer: Medicare Other | Source: Ambulatory Visit | Attending: Emergency Medicine | Admitting: Emergency Medicine

## 2012-03-13 ENCOUNTER — Ambulatory Visit: Payer: Medicare Other

## 2012-03-13 ENCOUNTER — Ambulatory Visit (INDEPENDENT_AMBULATORY_CARE_PROVIDER_SITE_OTHER): Payer: Medicare Other | Admitting: Emergency Medicine

## 2012-03-13 VITALS — BP 125/76 | HR 55 | Temp 98.3°F | Resp 17 | Ht 63.0 in | Wt 175.0 lb

## 2012-03-13 DIAGNOSIS — E119 Type 2 diabetes mellitus without complications: Secondary | ICD-10-CM

## 2012-03-13 DIAGNOSIS — R1011 Right upper quadrant pain: Secondary | ICD-10-CM

## 2012-03-13 DIAGNOSIS — R109 Unspecified abdominal pain: Secondary | ICD-10-CM

## 2012-03-13 LAB — POCT CBC
HCT, POC: 46.2 % (ref 37.7–47.9)
Lymph, poc: 3.4 (ref 0.6–3.4)
MCH, POC: 29 pg (ref 27–31.2)
MCHC: 30.3 g/dL — AB (ref 31.8–35.4)
MCV: 95.9 fL (ref 80–97)
POC Granulocyte: 3.9 (ref 2–6.9)
POC LYMPH PERCENT: 41.9 %L (ref 10–50)
RDW, POC: 13.6 %

## 2012-03-13 LAB — AMYLASE: Amylase: 26 U/L (ref 0–105)

## 2012-03-13 LAB — COMPREHENSIVE METABOLIC PANEL
ALT: 16 U/L (ref 0–35)
AST: 18 U/L (ref 0–37)
CO2: 29 mEq/L (ref 19–32)
Creat: 0.94 mg/dL (ref 0.50–1.10)
Total Bilirubin: 0.6 mg/dL (ref 0.3–1.2)

## 2012-03-13 LAB — POCT URINALYSIS DIPSTICK
Ketones, UA: NEGATIVE
Protein, UA: NEGATIVE
Spec Grav, UA: 1.02
pH, UA: 5

## 2012-03-13 LAB — POCT UA - MICROSCOPIC ONLY
Casts, Ur, LPF, POC: NEGATIVE
Mucus, UA: NEGATIVE
Yeast, UA: NEGATIVE

## 2012-03-13 LAB — GLUCOSE, POCT (MANUAL RESULT ENTRY): POC Glucose: 98 mg/dl (ref 70–99)

## 2012-03-13 MED ORDER — POLYETHYLENE GLYCOL 3350 17 GM/SCOOP PO POWD: 17.0000 g | Freq: Every day | ORAL | Status: AC

## 2012-03-13 MED ORDER — DICYCLOMINE HCL 20 MG PO TABS: 20.0000 mg | ORAL_TABLET | Freq: Four times a day (QID) | ORAL | Status: DC

## 2012-03-13 NOTE — Progress Notes (Signed)
Subjective:    Patient ID: Alison Friedman, female    DOB: [birth year], 66 y.o.   MRN: [id number]  HPI  Patient presents with abdominal pain. Has had this for 3 weeks, drank milk last night with good pain relief. Pain is right sided, has been constant pain. Has had recent colonoscopy, does not feel like pain is related to diverticulitis. Has not had any vomiting, reports minimal nausea. Patient reports she took a prescription of flagyl and this helped her have a bowel movement.  Review of Systems     Objective:   Physical Exam Patient has good breath sounds bilat. Abdomen soft, has tenderness on exam right upper quadrant. There is mild tenderness in the midepigastrium but no tenderness left lower quadrant the Results for orders placed in visit on 03/13/12  POCT CBC      Component Value Range   WBC 8.1  4.6 - 10.2 K/uL   Lymph, poc 3.4  0.6 - 3.4   POC LYMPH PERCENT 41.9  10 - 50 %L   MID (cbc) 0.8  0 - 0.9   POC MID % 9.9  0 - 12 %M   POC Granulocyte 3.9  2 - 6.9   Granulocyte percent 48.2  37 - 80 %G   RBC 4.82  4.04 - 5.48 M/uL   Hemoglobin 14.0  12.2 - 16.2 g/dL   HCT, POC 40.9  81.1 - 47.9 %   MCV 95.9  80 - 97 fL   MCH, POC 29.0  27 - 31.2 pg   MCHC 30.3 (*) 31.8 - 35.4 g/dL   RDW, POC 91.4     Platelet Count, POC 390  142 - 424 K/uL   MPV 8.9  0 - 99.8 fL  POCT URINALYSIS DIPSTICK      Component Value Range   Color, UA yellow     Clarity, UA clear     Glucose, UA neg     Bilirubin, UA neg     Ketones, UA neg     Spec Grav, UA 1.020     Blood, UA neg     pH, UA 5.0     Protein, UA neg     Urobilinogen, UA 0.2     Nitrite, UA neg     Leukocytes, UA Trace    POCT UA - MICROSCOPIC ONLY      Component Value Range   WBC, Ur, HPF, POC 0-1     RBC, urine, microscopic 0-1     Bacteria, U Microscopic neg     Mucus, UA neg     Epithelial cells, urine per micros 1-3     Crystals, Ur, HPF, POC neg     Casts, Ur, LPF, POC neg     Yeast, UA neg    GLUCOSE, POCT (MANUAL  RESULT ENTRY)      Component Value Range   POC Glucose 98  70 - 99 mg/dl     [facility] reading (PRIMARY) by  Dr.Lettie Czarnecki  there are 2 linear densities right midlung there is no fluid present. Patient has a large stool burden. There is a loop of bowel mid abdomen but I suspect is colon. There is no sign of obstruction. There is no free air seen on these films. There is a linear area seen on lateral whether this is an atelectatic area or scar area or fluid in the fissure I am not sure   Assessment & Plan:  Patient appears to be significantly constipated. She does  not appear to have an acute abdomen today. We'll go ahead and schedule a CT of the abdomen because of her previous history of ischemic colitis as well as the possibility this is gallbladder disease. We'll give her Bentyl for pain and MiraLax for the constipation.

## 2012-03-13 NOTE — Patient Instructions (Signed)
Take mira lax daily for constipation and we will contact you with CT scan appt.   Constipation in Adults Constipation is having fewer than 2 bowel movements per week. Usually, the stools are hard. As we grow older, constipation is more common. If you try to fix constipation with laxatives, the problem may get worse. This is because laxatives taken over a long period of time make the colon muscles weaker. A low-fiber diet, not taking in enough fluids, and taking some medicines may make these problems worse. MEDICATIONS THAT MAY CAUSE CONSTIPATION  Water pills (diuretics).   Calcium channel blockers (used to control blood pressure and for the heart).   Certain pain medicines (narcotics).   Anticholinergics.   Anti-inflammatory agents.   Antacids that contain aluminum.  DISEASES THAT CONTRIBUTE TO CONSTIPATION  Diabetes.   Parkinson's disease.   Dementia.   Stroke.   Depression.   Illnesses that cause problems with salt and water metabolism.  HOME CARE INSTRUCTIONS   Constipation is usually best cared for without medicines. Increasing dietary fiber and eating more fruits and vegetables is the best way to manage constipation.   Slowly increase fiber intake to 25 to 38 grams per day. Whole grains, fruits, vegetables, and legumes are good sources of fiber. A dietitian can further help you incorporate high-fiber foods into your diet.   Drink enough water and fluids to keep your urine clear or pale yellow.   A fiber supplement may be added to your diet if you cannot get enough fiber from foods.   Increasing your activities also helps improve regularity.   Suppositories, as suggested by your caregiver, will also help. If you are using antacids, such as aluminum or calcium containing products, it will be helpful to switch to products containing magnesium if your caregiver says it is okay.   If you have been given a liquid injection (enema) today, this is only a temporary measure. It  should not be relied on for treatment of longstanding (chronic) constipation.   Stronger measures, such as magnesium sulfate, should be avoided if possible. This may cause uncontrollable diarrhea. Using magnesium sulfate may not allow you time to make it to the bathroom.  SEEK IMMEDIATE MEDICAL CARE IF:   There is bright red blood in the stool.   The constipation stays for more than 4 days.   There is belly (abdominal) or rectal pain.   You do not seem to be getting better.   You have any questions or concerns.  MAKE SURE YOU:   Understand these instructions.   Will watch your condition.   Will get help right away if you are not doing well or get worse.  Document Released: 03/31/2004 Document Revised: 06/22/2011 Document Reviewed: 06/06/2011 [facility] Patient Information 2012 [place], [place].

## 2012-04-22 ENCOUNTER — Encounter: Payer: Self-pay | Admitting: Physician Assistant

## 2012-04-30 ENCOUNTER — Other Ambulatory Visit: Payer: Self-pay | Admitting: Radiology

## 2012-05-02 ENCOUNTER — Other Ambulatory Visit: Payer: Self-pay | Admitting: Radiology

## 2012-05-07 ENCOUNTER — Other Ambulatory Visit: Payer: Self-pay | Admitting: Radiology

## 2012-05-07 MED ORDER — SERTRALINE HCL 50 MG PO TABS: 50.0000 mg | ORAL_TABLET | Freq: Every day | ORAL | Status: DC

## 2012-05-08 ENCOUNTER — Telehealth: Payer: Self-pay

## 2012-05-08 MED ORDER — SERTRALINE HCL 50 MG PO TABS: 50.0000 mg | ORAL_TABLET | Freq: Every day | ORAL | Status: DC

## 2012-05-08 NOTE — Telephone Encounter (Signed)
Pt called and LM on nurse VM that her pharmacy had not received the RF of her Zoloft we sent yesterday. Called [facility] and gave them RF info over the phone. Notified pt on cell and home #s.

## 2012-05-21 ENCOUNTER — Ambulatory Visit (INDEPENDENT_AMBULATORY_CARE_PROVIDER_SITE_OTHER): Payer: Medicare Other | Admitting: Emergency Medicine

## 2012-05-21 ENCOUNTER — Encounter: Payer: Self-pay | Admitting: Emergency Medicine

## 2012-05-21 VITALS — BP 118/80 | HR 58 | Temp 98.1°F | Resp 16 | Ht 63.0 in | Wt 172.8 lb

## 2012-05-21 DIAGNOSIS — J301 Allergic rhinitis due to pollen: Secondary | ICD-10-CM

## 2012-05-21 DIAGNOSIS — E119 Type 2 diabetes mellitus without complications: Secondary | ICD-10-CM

## 2012-05-21 DIAGNOSIS — J329 Chronic sinusitis, unspecified: Secondary | ICD-10-CM | POA: Insufficient documentation

## 2012-05-21 DIAGNOSIS — M5090 Cervical disc disorder, unspecified, unspecified cervical region: Secondary | ICD-10-CM | POA: Insufficient documentation

## 2012-05-21 DIAGNOSIS — K589 Irritable bowel syndrome without diarrhea: Secondary | ICD-10-CM

## 2012-05-21 MED ORDER — DICYCLOMINE HCL 20 MG PO TABS: 20.0000 mg | ORAL_TABLET | Freq: Four times a day (QID) | ORAL | Status: DC

## 2012-05-21 NOTE — Progress Notes (Signed)
  Subjective:    Patient ID: Alison Friedman, female    DOB: 1946/01/14, 66 y.o.   MRN: [id number]  HPI problem #1 patient in for followup of her diabetes. She states she is doing well with this and taking her medication when she can remember. She states she is continued try and lose weight and watch her diet. She has no symptoms of neuropathy. Problem #2 related to her high cholesterol and she continues on diet and medication for this. Problem #3 allergic rhinitis she started to have a postnasal drip. She's condition she will have an infection within a couple weeks. She wants a prescription to keep on hand for Levaquin but I told her to call if she started having problems problem number 4 abdominal pain. She states the Bentyl helps a lot and she would like a refill on this medication   Review of Systems     Objective:   Physical Exam HEENT exam is unremarkable. There is no redness of the posterior pharynx. Her chest is clear. Her cardiac exam is unremarkable. Her abdomen is soft nontender. Her extremity exam reveals no evidence of neuropathy with normal sensation and pulses  Results for orders placed in visit on 04/22/12  HM MAMMOGRAPHY      Component Value Range   HM Mammogram [facility]; BI-RADS 2: Benign Findings     Results for orders placed in visit on 05/21/12  POCT GLYCOSYLATED HEMOGLOBIN (HGB A1C)      Component Value Range   Hemoglobin A1C 6.8    GLUCOSE, POCT (MANUAL RESULT ENTRY)      Component Value Range   POC Glucose 136 (*) 70 - 99 mg/dl       Assessment & Plan:  Hemoglobin A1c is stable at 6.8. She was at 6.7 last visit. I will continue to encourage diet and exercise along with her medications. She does forget to take her medicines that time. I reinforced this.

## 2012-07-21 ENCOUNTER — Ambulatory Visit (INDEPENDENT_AMBULATORY_CARE_PROVIDER_SITE_OTHER): Payer: Medicare Other | Admitting: Emergency Medicine

## 2012-07-21 VITALS — BP 147/67 | HR 72 | Temp 98.2°F | Resp 16 | Ht 63.5 in | Wt 173.0 lb

## 2012-07-21 DIAGNOSIS — R059 Cough, unspecified: Secondary | ICD-10-CM

## 2012-07-21 DIAGNOSIS — R05 Cough: Secondary | ICD-10-CM | POA: Diagnosis not present

## 2012-07-21 DIAGNOSIS — J40 Bronchitis, not specified as acute or chronic: Secondary | ICD-10-CM

## 2012-07-21 MED ORDER — LEVOFLOXACIN 500 MG PO TABS: 500.0000 mg | ORAL_TABLET | Freq: Every day | ORAL | Status: AC

## 2012-07-21 MED ORDER — HYDROCOD POLST-CHLORPHEN POLST 10-8 MG/5ML PO LQCR: 5.0000 mL | Freq: Two times a day (BID) | ORAL | Status: DC | PRN

## 2012-07-21 NOTE — Patient Instructions (Addendum)

## 2012-07-21 NOTE — Progress Notes (Signed)
  Subjective:    Patient ID: Alison Friedman, female    DOB: 14-Aug-1945, 67 y.o.   MRN: [id number]  HPI  67 year old female c/o sinus drainage and cough x 11 days. Was exposed to wood stove at [place] with the door open. He's here with chest congestion and cough which is times is productive of discolored phlegm. She has a history of recurrent sinusitis bronchitis. She does have an inhaler at home that she uses. She is a diabetic and therefore would try and avoid her taking steroids   Review of Systems     Objective:   Physical Exam TMs are clear. Nose is normal. Chest exam reveals bilateral rhonchi without dullness no rales are heard.        Assessment & Plan:     Patient seen with upper infection with bronchitis. We'll treat with Levaquin and Tussionex as she has done well with this regimen in the past. She will continue on Mucinex twice a day and has an albuterol inhaler to use

## 2012-09-10 ENCOUNTER — Encounter: Payer: Self-pay | Admitting: Emergency Medicine

## 2012-09-10 ENCOUNTER — Ambulatory Visit: Payer: Medicare Other

## 2012-09-10 ENCOUNTER — Ambulatory Visit (INDEPENDENT_AMBULATORY_CARE_PROVIDER_SITE_OTHER): Payer: Medicare Other | Admitting: Emergency Medicine

## 2012-09-10 VITALS — BP 138/78 | HR 60 | Temp 98.5°F | Resp 16 | Ht 62.5 in | Wt 174.0 lb

## 2012-09-10 DIAGNOSIS — E119 Type 2 diabetes mellitus without complications: Secondary | ICD-10-CM | POA: Diagnosis not present

## 2012-09-10 DIAGNOSIS — R5381 Other malaise: Secondary | ICD-10-CM

## 2012-09-10 DIAGNOSIS — R05 Cough: Secondary | ICD-10-CM

## 2012-09-10 DIAGNOSIS — M25552 Pain in left hip: Secondary | ICD-10-CM

## 2012-09-10 DIAGNOSIS — IMO0001 Reserved for inherently not codable concepts without codable children: Secondary | ICD-10-CM | POA: Diagnosis not present

## 2012-09-10 DIAGNOSIS — M25559 Pain in unspecified hip: Secondary | ICD-10-CM | POA: Diagnosis not present

## 2012-09-10 DIAGNOSIS — R5383 Other fatigue: Secondary | ICD-10-CM | POA: Diagnosis not present

## 2012-09-10 DIAGNOSIS — F439 Reaction to severe stress, unspecified: Secondary | ICD-10-CM

## 2012-09-10 DIAGNOSIS — R059 Cough, unspecified: Secondary | ICD-10-CM

## 2012-09-10 LAB — COMPREHENSIVE METABOLIC PANEL
ALT: 12 U/L (ref 0–35)
Albumin: 4.5 g/dL (ref 3.5–5.2)
Alkaline Phosphatase: 84 U/L (ref 39–117)
CO2: 24 mEq/L (ref 19–32)
Glucose, Bld: 119 mg/dL — ABNORMAL HIGH (ref 70–99)
Potassium: 4.6 mEq/L (ref 3.5–5.3)
Sodium: 140 mEq/L (ref 135–145)
Total Bilirubin: 0.8 mg/dL (ref 0.3–1.2)
Total Protein: 7 g/dL (ref 6.0–8.3)

## 2012-09-10 LAB — CBC WITH DIFFERENTIAL/PLATELET
Basophils Relative: 0 % (ref 0–1)
HCT: 40 % (ref 36.0–46.0)
Hemoglobin: 13.8 g/dL (ref 12.0–15.0)
Lymphocytes Relative: 37 % (ref 12–46)
Monocytes Absolute: 0.8 10*3/uL (ref 0.1–1.0)
Monocytes Relative: 11 % (ref 3–12)
Neutro Abs: 3 10*3/uL (ref 1.7–7.7)
Neutrophils Relative %: 44 % (ref 43–77)
RBC: 4.5 MIL/uL (ref 3.87–5.11)
WBC: 6.9 10*3/uL (ref 4.0–10.5)

## 2012-09-10 LAB — CK: Total CK: 60 U/L (ref 7–177)

## 2012-09-10 MED ORDER — DULOXETINE HCL 20 MG PO CPEP: 20.0000 mg | ORAL_CAPSULE | Freq: Every day | ORAL | Status: DC

## 2012-09-10 MED ORDER — ALPRAZOLAM 0.5 MG PO TABS: 0.5000 mg | ORAL_TABLET | ORAL | Status: DC | PRN

## 2012-09-10 NOTE — Progress Notes (Signed)
  Subjective:    Patient ID: Alison Friedman, female    DOB: [birth year], 67 y.o.   MRN: [id number]  HPI problem #1 diabetes. Patient states she's been following her diet taking medications as instructed. Problem #2 left hip pain. She's had significant pain in her left hip with difficulty walking. She's had previous x-rays. No x-rays in the last year. She has been on epidural steroids in the past regarding cervical disc disease sinus problems. Problem #3 suspected allergy to Levaquin she was recently treated for sinus chest infection with Levaquin. She had facial flushing and is complaining today of myalgias she feels may be related to the medication. She is unclear about this. 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 Problem #4 depression she feels she is getting depressed over her multiple medical problems and has not been resting well and suffering from her left hip discomfort. Her husband is with her and they're asking about different depression medicines. She's has tried Zoloft for her tremor that was prescribed by Dr. Sandria Manly and she feels she does not tolerate this medication well    Review of Systems     Objective:   Physical Exam patient is alert and cooperative but seems somewhat depressed. Her TMs are clear her throat is normal her neck is supple. Chest exam reveals clear breath sounds there are no wheezes audible cardiac exam is regular rate without murmurs. Examination of the feet reveal good pulses with normal sensation. Examination of that left hip reveals significant pain with internal rotation . Results for orders placed in visit on 05/21/12  POCT GLYCOSYLATED HEMOGLOBIN (HGB A1C)      Result Value Range   Hemoglobin A1C 6.8    GLUCOSE, POCT (MANUAL RESULT ENTRY)      Result Value Range   POC Glucose 136 (*) 70 - 99 mg/dl   [facility] reading (PRIMARY) by  Dr.Kannan Proia there some mild increased markings posterior  to the heart but no definite consolidating infiltrates. Left hip films show calcification in the greater trochanter but otherwise the joint space looks good. Marland Kitchen Results for orders placed in visit on 09/10/12  POCT GLYCOSYLATED HEMOGLOBIN (HGB A1C)      Result Value Range   Hemoglobin A1C 7.2    GLUCOSE, POCT (MANUAL RESULT ENTRY)      Result Value Range   POC Glucose 121 (*) 70 - 99 mg/dl        Assessment & Plan:  Proceed with x-rays of her left hip as well as routine lab including a sugar and A1c to followup on her diabetes. And Cymbalta 20 mg one a day. She's been off her Zoloft now for a month. Her Xanax was refilled. Referral made to an orthopedist for evaluation of her left hip discomfort and probable trochanteric bursitis.

## 2012-09-25 DIAGNOSIS — M76899 Other specified enthesopathies of unspecified lower limb, excluding foot: Secondary | ICD-10-CM | POA: Diagnosis not present

## 2012-10-10 ENCOUNTER — Other Ambulatory Visit: Payer: Self-pay | Admitting: Radiology

## 2012-10-10 MED ORDER — METFORMIN HCL 500 MG PO TABS: 500.0000 mg | ORAL_TABLET | Freq: Two times a day (BID) | ORAL | Status: DC

## 2012-10-10 NOTE — Telephone Encounter (Signed)
Metformin sent in for her per protocol

## 2012-10-16 ENCOUNTER — Telehealth: Payer: Self-pay

## 2012-10-16 MED ORDER — METFORMIN HCL ER 500 MG PO TB24: 500.0000 mg | ORAL_TABLET | Freq: Two times a day (BID) | ORAL | Status: DC

## 2012-10-16 NOTE — Telephone Encounter (Signed)
Yes let's verify what she has taken in the past.  It looks like she's been on metformin for awhile now, is the diarrhea new?  This is common with metformin.  It tends to improve if pt's take with food - either during or immediately a meal

## 2012-10-16 NOTE — Telephone Encounter (Signed)
Thanks, this was already corrected, just needed to make you aware of error. [name] advised.

## 2012-10-16 NOTE — Telephone Encounter (Signed)
Called pharmacy to clarify what she normally gets. This is corrected for her. To you FYI, see me if questions.

## 2012-10-16 NOTE — Telephone Encounter (Signed)
PATIENT STATES THAT WHEN SHE RECEIVED THE REFILL FOR METFORMIN SHE GOT THE REGULAR KIND AND THE EXTENDED RELIEF LIKE SHE NORMALLY GETS. PATIENT STATES THAT THE MEDICATION IS GIVING HER DIARRHEA. PATIENT STATES HER PHARMACY, [facility] [address], FAXED OVER THIS RX REQUEST.   BEST NUMBER: [phone]

## 2012-10-19 ENCOUNTER — Ambulatory Visit (INDEPENDENT_AMBULATORY_CARE_PROVIDER_SITE_OTHER): Payer: Medicare Other | Admitting: Family Medicine

## 2012-10-19 VITALS — BP 158/82 | HR 81 | Temp 98.0°F | Resp 17 | Ht 62.5 in | Wt 170.0 lb

## 2012-10-19 DIAGNOSIS — H01006 Unspecified blepharitis left eye, unspecified eyelid: Secondary | ICD-10-CM

## 2012-10-19 DIAGNOSIS — H00019 Hordeolum externum unspecified eye, unspecified eyelid: Secondary | ICD-10-CM | POA: Diagnosis not present

## 2012-10-19 DIAGNOSIS — H01003 Unspecified blepharitis right eye, unspecified eyelid: Secondary | ICD-10-CM

## 2012-10-19 DIAGNOSIS — H00013 Hordeolum externum right eye, unspecified eyelid: Secondary | ICD-10-CM

## 2012-10-19 MED ORDER — CLINDAMYCIN HCL 150 MG PO CAPS: 150.0000 mg | ORAL_CAPSULE | Freq: Three times a day (TID) | ORAL | Status: DC

## 2012-10-19 MED ORDER — TOBRAMYCIN 0.3 % OP SOLN: 1.0000 [drp] | OPHTHALMIC | Status: DC

## 2012-10-19 NOTE — Patient Instructions (Addendum)
Use the eye drops every 4 hours when awake for the next few days until the hives better for 2 days.

## 2012-10-19 NOTE — Progress Notes (Signed)
Subjective: Patient has had a stye on her right lower eyelid for last week. It has now gotten intensely painful and she came in to get it checked. She has not had any surgeries on her eyelids.  Objective: She has a stye on her right lower lid it has to little pustular heads on it. 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 The right one is very tender.  Using a 21-gauge needle I did a needle I and D. of the lid. Was able to express a tiny bit of pus which was cultured.  Assessment: Stye lower lid on right Blepharitis bilaterally  Plan: Started on tobramycin eyedrops because of her multiple allergies. Gave her prescription for drops to use every 4 hours at home. With the inflammation and pain at the granddaughter back on clindamycin 150 mg 4 times a day. She's been on this before. They're not making think she can tolerate.

## 2012-10-22 LAB — WOUND CULTURE
Gram Stain: NONE SEEN
Gram Stain: NONE SEEN
Gram Stain: NONE SEEN
Organism ID, Bacteria: NO GROWTH

## 2012-11-21 ENCOUNTER — Ambulatory Visit: Payer: Medicare Other

## 2012-11-21 ENCOUNTER — Ambulatory Visit (INDEPENDENT_AMBULATORY_CARE_PROVIDER_SITE_OTHER): Payer: Medicare Other | Admitting: Emergency Medicine

## 2012-11-21 VITALS — BP 150/90 | HR 86 | Temp 98.0°F | Resp 16 | Ht 62.0 in | Wt 170.0 lb

## 2012-11-21 DIAGNOSIS — H00019 Hordeolum externum unspecified eye, unspecified eyelid: Secondary | ICD-10-CM | POA: Diagnosis not present

## 2012-11-21 DIAGNOSIS — H5711 Ocular pain, right eye: Secondary | ICD-10-CM

## 2012-11-21 DIAGNOSIS — H00013 Hordeolum externum right eye, unspecified eyelid: Secondary | ICD-10-CM

## 2012-11-21 DIAGNOSIS — H5710 Ocular pain, unspecified eye: Secondary | ICD-10-CM

## 2012-11-21 MED ORDER — TOBRAMYCIN 0.3 % OP SOLN: 1.0000 [drp] | OPHTHALMIC | Status: DC

## 2012-11-21 NOTE — Progress Notes (Signed)
  Subjective:    Patient ID: Alison Friedman, female    DOB: [birth year], 67 y.o.   MRN: [id number]  HPI patient comes into our office today with a cyst on her right eye lid. She was here April fifth and saw Dr Alwyn Ren was was DX with a stye on her right bottom eye lid    Review of Systems  Eyes: Negative for photophobia, pain, discharge, redness, itching and visual disturbance.       Objective:   Physical Exam there is a 3-4 mm cystic area in the midportion of the right lower lid. The itself looks normal.        Assessment & Plan:  Patient is of the right. Will refill tobramycin previous culture done was negative

## 2012-11-26 DIAGNOSIS — H0019 Chalazion unspecified eye, unspecified eyelid: Secondary | ICD-10-CM | POA: Diagnosis not present

## 2012-12-10 DIAGNOSIS — H0019 Chalazion unspecified eye, unspecified eyelid: Secondary | ICD-10-CM | POA: Diagnosis not present

## 2012-12-17 ENCOUNTER — Ambulatory Visit (INDEPENDENT_AMBULATORY_CARE_PROVIDER_SITE_OTHER): Payer: Medicare Other | Admitting: Emergency Medicine

## 2012-12-17 ENCOUNTER — Encounter: Payer: Self-pay | Admitting: Emergency Medicine

## 2012-12-17 VITALS — BP 120/74 | HR 71 | Temp 98.1°F | Resp 16 | Ht 63.0 in | Wt 171.0 lb

## 2012-12-17 DIAGNOSIS — E119 Type 2 diabetes mellitus without complications: Secondary | ICD-10-CM | POA: Diagnosis not present

## 2012-12-17 DIAGNOSIS — H00013 Hordeolum externum right eye, unspecified eyelid: Secondary | ICD-10-CM

## 2012-12-17 DIAGNOSIS — H00019 Hordeolum externum unspecified eye, unspecified eyelid: Secondary | ICD-10-CM | POA: Diagnosis not present

## 2012-12-17 MED ORDER — METFORMIN HCL ER 500 MG PO TB24: ORAL_TABLET | ORAL | Status: DC

## 2012-12-17 NOTE — Progress Notes (Signed)
  Subjective:    Patient ID: Alison Friedman, female    DOB: 1946/07/06, 67 y.o.   MRN: [id number]  HPI patient enters for followup of her diabetes. Her sugar last night was 120. Her sugars have been running high because she required a left hip steroid injection and took a tapered dose of steroids she did not finish because her sugar has been dropping out. Stopped her Cymbalta because it made her feel funny. She also feels the Micardis she takes causes palpitations when it is taken in a generic form and is deciding whether she wants to go back to the trade drug.    Review of Systems     Objective:   Physical Exam patient looks great today. Her blood pressures completely normal. Her neck is supple. Her chest is clear to auscultation and percussion. Heart is a regular rate without murmurs. There is a inflamed red 4 mm area midportion of the lid on the right       Assessment & Plan:  I did not do an A1c today because the patient has been on steroids and it would not be accurate. Will reevaluate A1c and labs in 3 months. She is to followup with Dr. Emily Filbert regarding the lesion on her lid with probable biopsy if it is not resolving

## 2013-01-10 ENCOUNTER — Other Ambulatory Visit: Payer: Self-pay | Admitting: Emergency Medicine

## 2013-01-13 DIAGNOSIS — H0019 Chalazion unspecified eye, unspecified eyelid: Secondary | ICD-10-CM | POA: Diagnosis not present

## 2013-02-07 ENCOUNTER — Other Ambulatory Visit: Payer: Self-pay | Admitting: Emergency Medicine

## 2013-02-11 DIAGNOSIS — E119 Type 2 diabetes mellitus without complications: Secondary | ICD-10-CM | POA: Diagnosis not present

## 2013-03-25 ENCOUNTER — Encounter: Payer: Self-pay | Admitting: Emergency Medicine

## 2013-03-25 ENCOUNTER — Ambulatory Visit (INDEPENDENT_AMBULATORY_CARE_PROVIDER_SITE_OTHER): Payer: Medicare Other | Admitting: Emergency Medicine

## 2013-03-25 VITALS — BP 124/70 | HR 59 | Temp 98.3°F | Resp 16 | Ht 62.5 in | Wt 167.6 lb

## 2013-03-25 DIAGNOSIS — J45909 Unspecified asthma, uncomplicated: Secondary | ICD-10-CM

## 2013-03-25 DIAGNOSIS — J40 Bronchitis, not specified as acute or chronic: Secondary | ICD-10-CM | POA: Diagnosis not present

## 2013-03-25 DIAGNOSIS — H00019 Hordeolum externum unspecified eye, unspecified eyelid: Secondary | ICD-10-CM | POA: Diagnosis not present

## 2013-03-25 DIAGNOSIS — B373 Candidiasis of vulva and vagina: Secondary | ICD-10-CM

## 2013-03-25 DIAGNOSIS — E119 Type 2 diabetes mellitus without complications: Secondary | ICD-10-CM

## 2013-03-25 DIAGNOSIS — R062 Wheezing: Secondary | ICD-10-CM

## 2013-03-25 DIAGNOSIS — H00013 Hordeolum externum right eye, unspecified eyelid: Secondary | ICD-10-CM

## 2013-03-25 DIAGNOSIS — B3731 Acute candidiasis of vulva and vagina: Secondary | ICD-10-CM

## 2013-03-25 LAB — POCT GLYCOSYLATED HEMOGLOBIN (HGB A1C): Hemoglobin A1C: 6.7

## 2013-03-25 MED ORDER — FLUCONAZOLE 150 MG PO TABS: 150.0000 mg | ORAL_TABLET | Freq: Once | ORAL | Status: DC

## 2013-03-25 MED ORDER — HYDROCOD POLST-CHLORPHEN POLST 10-8 MG/5ML PO LQCR: 5.0000 mL | Freq: Two times a day (BID) | ORAL | Status: DC | PRN

## 2013-03-25 MED ORDER — CLINDAMYCIN HCL 300 MG PO CAPS: 300.0000 mg | ORAL_CAPSULE | Freq: Three times a day (TID) | ORAL | Status: DC

## 2013-03-25 NOTE — Progress Notes (Signed)
  Subjective:    Patient ID: Alison Friedman, female    DOB: Aug 22, 1945, 67 y.o.   MRN: [id number]  HPI patient in for followup of her diabetes. She states she has been losing weight. She continues to smoke intermittently. She is requesting a refill on her cough medication and has been using her inhaler for reactive airways disease. She's been coughing up small amounts of the color type phlegm she has a history of allergies this time a year    Review of Systems     Objective:   Physical Exam HEENT exam reveals nasal congestion. Chest exam reveals decreased breath sounds in the bases with occasional rhonchi and mild prolongation of expiration. Heart is regular rate without murmurs.  Results for orders placed in visit on 03/25/13  POCT GLYCOSYLATED HEMOGLOBIN (HGB A1C)      Result Value Range   Hemoglobin A1C 6.7          Assessment & Plan:   Diabetes is doing well. She does appear to have an acute bronchitis. Plan given him treatment today she will be placed on clindamycin and given some Tussionex for nighttime for cough. She has an albuterol inhaler to use . I do feel there is an component of allergy but I do not want to use steroids unless absolutely necessary because of her underlying diabetes.

## 2013-05-16 ENCOUNTER — Other Ambulatory Visit: Payer: Self-pay | Admitting: Emergency Medicine

## 2013-06-16 ENCOUNTER — Other Ambulatory Visit: Payer: Self-pay | Admitting: Emergency Medicine

## 2013-06-26 DIAGNOSIS — IMO0002 Reserved for concepts with insufficient information to code with codable children: Secondary | ICD-10-CM | POA: Diagnosis not present

## 2013-06-26 DIAGNOSIS — M899 Disorder of bone, unspecified: Secondary | ICD-10-CM | POA: Diagnosis not present

## 2013-06-26 DIAGNOSIS — Z1231 Encounter for screening mammogram for malignant neoplasm of breast: Secondary | ICD-10-CM | POA: Diagnosis not present

## 2013-07-01 ENCOUNTER — Ambulatory Visit (INDEPENDENT_AMBULATORY_CARE_PROVIDER_SITE_OTHER): Payer: Medicare Other | Admitting: Emergency Medicine

## 2013-07-01 ENCOUNTER — Encounter: Payer: Self-pay | Admitting: Emergency Medicine

## 2013-07-01 VITALS — BP 130/80 | HR 58 | Temp 98.1°F | Resp 16 | Ht 63.0 in | Wt 167.0 lb

## 2013-07-01 DIAGNOSIS — F439 Reaction to severe stress, unspecified: Secondary | ICD-10-CM

## 2013-07-01 DIAGNOSIS — Z733 Stress, not elsewhere classified: Secondary | ICD-10-CM | POA: Diagnosis not present

## 2013-07-01 DIAGNOSIS — J019 Acute sinusitis, unspecified: Secondary | ICD-10-CM

## 2013-07-01 DIAGNOSIS — E119 Type 2 diabetes mellitus without complications: Secondary | ICD-10-CM

## 2013-07-01 DIAGNOSIS — I10 Essential (primary) hypertension: Secondary | ICD-10-CM

## 2013-07-01 DIAGNOSIS — J209 Acute bronchitis, unspecified: Secondary | ICD-10-CM

## 2013-07-01 LAB — CBC WITH DIFFERENTIAL/PLATELET
Basophils Relative: 0 % (ref 0–1)
Eosinophils Absolute: 0.5 10*3/uL (ref 0.0–0.7)
HCT: 39.3 % (ref 36.0–46.0)
Hemoglobin: 13.5 g/dL (ref 12.0–15.0)
MCH: 30.4 pg (ref 26.0–34.0)
MCHC: 34.4 g/dL (ref 30.0–36.0)
MCV: 88.5 fL (ref 78.0–100.0)
Monocytes Absolute: 0.7 10*3/uL (ref 0.1–1.0)
Monocytes Relative: 11 % (ref 3–12)
Neutrophils Relative %: 44 % (ref 43–77)
RDW: 13.5 % (ref 11.5–15.5)

## 2013-07-01 LAB — LIPID PANEL
LDL Cholesterol: 95 mg/dL (ref 0–99)
Total CHOL/HDL Ratio: 4 Ratio
VLDL: 40 mg/dL (ref 0–40)

## 2013-07-01 LAB — GLUCOSE, POCT (MANUAL RESULT ENTRY): POC Glucose: 92 mg/dl (ref 70–99)

## 2013-07-01 MED ORDER — TELMISARTAN 40 MG PO TABS: ORAL_TABLET | ORAL | Status: DC

## 2013-07-01 MED ORDER — BUDESONIDE-FORMOTEROL FUMARATE 160-4.5 MCG/ACT IN AERO: 2.0000 | INHALATION_SPRAY | Freq: Two times a day (BID) | RESPIRATORY_TRACT | Status: DC

## 2013-07-01 MED ORDER — ALPRAZOLAM 0.5 MG PO TABS: 0.5000 mg | ORAL_TABLET | ORAL | Status: DC | PRN

## 2013-07-01 MED ORDER — CLINDAMYCIN HCL 300 MG PO CAPS: 300.0000 mg | ORAL_CAPSULE | Freq: Three times a day (TID) | ORAL | Status: DC

## 2013-07-01 NOTE — Progress Notes (Signed)
   Subjective:    Patient ID: Alison Friedman, female    DOB: 1946/06/25, 67 y.o.   MRN: [id number]  HPI patient here to followup on her diabetes. She has not checked her sugars but has been doing the same regarding medications and diet. She also recently has had difficulty with postnasal drip purulent nasal drainage and a productive cough. She has to use her inhaler 4-5 times per week. In the morning she feels incredibly tight in her chest but after she takes a shower and coughs up some phlegm she feels better.    Review of Systems     Objective:   Physical Exam HEENT reveals congested nasal mucosa. Chest exam reveals bilateral wheezes coarse breath sounds and rhonchi. Cardiac exam is unremarkable.    Results for orders placed in visit on 07/01/13  GLUCOSE, POCT (MANUAL RESULT ENTRY)      Result Value Range   POC Glucose 92  70 - 99 mg/dl  POCT GLYCOSYLATED HEMOGLOBIN (HGB A1C)      Result Value Range   Hemoglobin A1C 6.9        Assessment & Plan:  Her diabetes is under fair control with an A1c of 6.9. Her last A1c was 6.7. She has an acute sinusitis bronchitis at the present time and will treat with Symbicort and clindamycin since she has multiple drug allergies.

## 2013-07-01 NOTE — Patient Instructions (Signed)
Taking antibiotics as instructed. please use your new inhaler 2 puffs twice a day and rinse your mouth out after using.

## 2013-07-02 ENCOUNTER — Telehealth: Payer: Self-pay | Admitting: *Deleted

## 2013-07-02 NOTE — Telephone Encounter (Signed)
Spoke with patient earlier this morning and gave her mammography results, still were waiting on fax for bone density. Called patient back to give bone density results, had to leave a message, Let her know that Dr. Cleta Alberts stated was worse than last one and to take Ca+ 1500mg  a day same as taking 2 ES tums and Vit D 2000 units a day. If patient has and questions to call office back.

## 2013-07-14 ENCOUNTER — Other Ambulatory Visit: Payer: Self-pay | Admitting: Emergency Medicine

## 2013-07-15 NOTE — Telephone Encounter (Signed)
Dr Cleta Alberts, you just saw this pt but not for IBS. Can we RF for pt, or need to RTC?

## 2013-07-30 ENCOUNTER — Encounter: Payer: Self-pay | Admitting: Emergency Medicine

## 2013-08-08 ENCOUNTER — Ambulatory Visit (INDEPENDENT_AMBULATORY_CARE_PROVIDER_SITE_OTHER): Payer: Medicare Other | Admitting: Emergency Medicine

## 2013-08-08 VITALS — BP 114/68 | HR 61 | Temp 98.4°F | Resp 18 | Ht 62.5 in | Wt 169.0 lb

## 2013-08-08 DIAGNOSIS — R059 Cough, unspecified: Secondary | ICD-10-CM | POA: Diagnosis not present

## 2013-08-08 DIAGNOSIS — J45909 Unspecified asthma, uncomplicated: Secondary | ICD-10-CM

## 2013-08-08 DIAGNOSIS — R05 Cough: Secondary | ICD-10-CM | POA: Diagnosis not present

## 2013-08-08 DIAGNOSIS — J40 Bronchitis, not specified as acute or chronic: Secondary | ICD-10-CM

## 2013-08-08 DIAGNOSIS — J329 Chronic sinusitis, unspecified: Secondary | ICD-10-CM | POA: Diagnosis not present

## 2013-08-08 DIAGNOSIS — T782XXA Anaphylactic shock, unspecified, initial encounter: Secondary | ICD-10-CM

## 2013-08-08 MED ORDER — CEPHALEXIN 500 MG PO CAPS: 500.0000 mg | ORAL_CAPSULE | Freq: Three times a day (TID) | ORAL | Status: DC

## 2013-08-08 MED ORDER — HYDROCOD POLST-CHLORPHEN POLST 10-8 MG/5ML PO LQCR: 5.0000 mL | Freq: Two times a day (BID) | ORAL | Status: DC | PRN

## 2013-08-08 MED ORDER — EPINEPHRINE 0.3 MG/0.3ML IJ SOAJ: 0.3000 mg | Freq: Once | INTRAMUSCULAR | Status: DC

## 2013-08-08 NOTE — Progress Notes (Signed)
   Subjective:   Patient ID: Alison Friedman, female    DOB: 1946-01-08, 68 y.o.   MRN: [id number]  This chart was scribed for Arlyss Queen, MD by Elby Beck, Scribe. This patient was seen in room 1 and the patient's care was started at 9:58 AM.  Chief Complaint  Patient presents with  . sinus issues    x1 mth  . Nasal Congestion    HPI  HPI Comments: Alison Friedman is a 68 y.o. female who presents to [facility] complaining of sinus pressure over the past few days. She reports associated nasal congestion over the past month, which worsened a few days ago. She also states that she has had a mildly productive cough for the past few days. She was noted to be wheezing on exam, and states she has been using her prescribed Albuterol inhaler with some relief of sympotms. She also states that she has been using Symbicort which she was given in the past with some relief. She further states that Tussionex has helped her rest at night when she has had sinus issues in the past. She states that she was seen here about 1 month ago for flu-like symptoms. She states that she overcame that illness, but has developed sinus issues since last being seen. She reports having a history of similar sinus issues. She states that she has been taking Keflex for the past 3 days, and that this is to be a 10 day course.    Review of Systems  HENT: Positive for congestion and sinus pressure.   Respiratory: Positive for cough and wheezing.       BP 114/68  Pulse 61  Temp(Src) 98.4 F (36.9 C) (Oral)  Resp 18  Ht 5' 2.5" (1.588 m)  Wt 169 lb (76.658 kg)  BMI 30.40 kg/m2  SpO2 94%  Objective:   Physical Exam CONSTITUTIONAL: Well developed/well nourished HEAD: Normocephalic/atraumatic EYES: EOMI/PERRL she has puffiness over the sinuses and swelling of the turbinates. ENMT: Mucous membranes moist NECK: supple no meningeal signs SPINE:entire spine nontender CV: S1/S2 noted, no murmurs/rubs/gallops  noted LUNGS: There are bilateral end expiratory wheezes noted there are no rales heard breath sounds are symmetrical there is good air exchange  ABDOMEN: soft, nontender, no rebound or guarding GU:no cva tenderness NEURO: Pt is awake/alert, moves all extremitiesx4 EXTREMITIES: pulses normal, full ROM SKIN: warm, color normal PSYCH: no abnormalities of mood noted      Assessment & Plan:  Patient will continue her Flonase. She was given a prescription for Symbicort with a coupon. She will be on cephalexin 500 3 times a day for one week. She will use her rescue inhaler as needed   I personally performed the services described in this documentation, which was scribed in my presence. The recorded information has been reviewed and is accurate.

## 2013-08-25 ENCOUNTER — Other Ambulatory Visit: Payer: Self-pay | Admitting: Physician Assistant

## 2013-08-26 DIAGNOSIS — M76899 Other specified enthesopathies of unspecified lower limb, excluding foot: Secondary | ICD-10-CM | POA: Diagnosis not present

## 2013-10-07 ENCOUNTER — Encounter: Payer: Self-pay | Admitting: Emergency Medicine

## 2013-10-07 ENCOUNTER — Ambulatory Visit (INDEPENDENT_AMBULATORY_CARE_PROVIDER_SITE_OTHER): Payer: Medicare Other | Admitting: Emergency Medicine

## 2013-10-07 VITALS — BP 120/64 | HR 70 | Temp 98.2°F | Resp 16 | Ht 62.0 in | Wt 166.2 lb

## 2013-10-07 DIAGNOSIS — R079 Chest pain, unspecified: Secondary | ICD-10-CM

## 2013-10-07 DIAGNOSIS — E119 Type 2 diabetes mellitus without complications: Secondary | ICD-10-CM

## 2013-10-07 DIAGNOSIS — J40 Bronchitis, not specified as acute or chronic: Secondary | ICD-10-CM | POA: Diagnosis not present

## 2013-10-07 DIAGNOSIS — J209 Acute bronchitis, unspecified: Secondary | ICD-10-CM

## 2013-10-07 DIAGNOSIS — B3731 Acute candidiasis of vulva and vagina: Secondary | ICD-10-CM

## 2013-10-07 DIAGNOSIS — B373 Candidiasis of vulva and vagina: Secondary | ICD-10-CM

## 2013-10-07 DIAGNOSIS — J329 Chronic sinusitis, unspecified: Secondary | ICD-10-CM

## 2013-10-07 LAB — POCT GLYCOSYLATED HEMOGLOBIN (HGB A1C): Hemoglobin A1C: 6.6

## 2013-10-07 LAB — GLUCOSE, POCT (MANUAL RESULT ENTRY): POC Glucose: 122 mg/dl — AB (ref 70–99)

## 2013-10-07 MED ORDER — HYDROCOD POLST-CHLORPHEN POLST 10-8 MG/5ML PO LQCR: 5.0000 mL | Freq: Two times a day (BID) | ORAL | Status: DC | PRN

## 2013-10-07 MED ORDER — CLINDAMYCIN HCL 300 MG PO CAPS: 300.0000 mg | ORAL_CAPSULE | Freq: Three times a day (TID) | ORAL | Status: DC

## 2013-10-07 MED ORDER — FLUCONAZOLE 150 MG PO TABS: 150.0000 mg | ORAL_TABLET | Freq: Once | ORAL | Status: DC

## 2013-10-07 NOTE — Progress Notes (Signed)
  This chart was scribed for Alison Russian, MD by Alison Friedman, ED Scribe. This patient was seen in room Room/bed 22 and the patient's care was started at 1:55 PM. Subjective:    Patient ID: Alison Friedman, female    DOB: Jun 21, 1946, 68 y.o.   MRN: [id number]  HPI Alison Friedman is a 68 y.o. female who [facility] to the [facility] for F/U apt. Pt states she suspects her glucose levels are terrible due to not being seen for a long period of time. Pt states she had a hip shot given 1 month ago and reports having pain. Pt states her orthopedist is Dr. Shela Leff. Pt states she has been working a great amount of hours a week.  Pt c/o dark green sputum, sore throat, and cough. She reports to be using a saline spray. Pt states she has tried Keflax and denies relief. She reports having relief with Clindamycin and denies use of any other medications due to allergic reactions.  Pt c/o bilateral lateral leg and knee pain. She states her pain can be severe and causes her to become tearful and depressed.   Pt states when she is overly stressed, she feels chest tightness. She states "it's nothing horrible". Pt states she had a heart cath preformed that took place 7-8 years ago.  Review of Systems  HENT: Positive for congestion, postnasal drip and sore throat.   Respiratory: Positive for cough and chest tightness.    Objective:   Physical Exam CONSTITUTIONAL: Well developed/well nourished HEAD: Normocephalic/atraumatic EYES: EOMI/PERRL ENMT: Mucous membranes moist. Nasal congestion NECK: supple no meningeal signs SPINE:entire spine nontender CV: S1/S2 noted, no murmurs/rubs/gallops noted LUNGS: Lungs are clear to auscultation bilaterally, no apparent distress. Bilateral rhonchi with a deep cough. ABDOMEN: soft, nontender, no rebound or guarding GU:no cva tenderness NEURO: Pt is awake/alert, moves all extremitiesx4 EXTREMITIES: pulses normal, full ROM SKIN: warm, color normal PSYCH: no abnormalities  of mood noted Results for orders placed in visit on 10/07/13  GLUCOSE, POCT (MANUAL RESULT ENTRY)      Result Value Ref Range   POC Glucose 122 (*) 70 - 99 mg/dl  POCT GLYCOSYLATED HEMOGLOBIN (HGB A1C)      Result Value Ref Range   Hemoglobin A1C 6.6    EKG no acute changes there is T-wave inversion V1 V2  Triage Vitals:BP 120/64  Pulse 70  Temp(Src) 98.2 F (36.8 C) (Oral)  Resp 16  Ht 5\' 2"  (1.575 m)  Wt 166 lb 3.2 oz (75.388 kg)  BMI 30.39 kg/m2  SpO2 98% Assessment & Plan:  I placed the patient on Cleocin to treat sinusitis. Her diabetes is under fair control. Referral made to cardiology for their evaluation. I did not see any acute changes on her EKG except for T wave inversion V1 V2   I personally performed the services described in this documentation, which was scribed in my presence. The recorded information has been reviewed and is accurate.

## 2013-11-05 DIAGNOSIS — I209 Angina pectoris, unspecified: Secondary | ICD-10-CM | POA: Diagnosis not present

## 2013-11-05 DIAGNOSIS — E119 Type 2 diabetes mellitus without complications: Secondary | ICD-10-CM | POA: Diagnosis not present

## 2013-11-05 DIAGNOSIS — R0789 Other chest pain: Secondary | ICD-10-CM | POA: Diagnosis not present

## 2013-11-05 DIAGNOSIS — I2510 Atherosclerotic heart disease of native coronary artery without angina pectoris: Secondary | ICD-10-CM | POA: Diagnosis not present

## 2013-11-21 DIAGNOSIS — R079 Chest pain, unspecified: Secondary | ICD-10-CM | POA: Diagnosis not present

## 2013-11-21 DIAGNOSIS — I2510 Atherosclerotic heart disease of native coronary artery without angina pectoris: Secondary | ICD-10-CM | POA: Diagnosis not present

## 2013-12-04 DIAGNOSIS — I209 Angina pectoris, unspecified: Secondary | ICD-10-CM | POA: Diagnosis not present

## 2013-12-04 DIAGNOSIS — E782 Mixed hyperlipidemia: Secondary | ICD-10-CM | POA: Diagnosis not present

## 2013-12-04 DIAGNOSIS — E119 Type 2 diabetes mellitus without complications: Secondary | ICD-10-CM | POA: Diagnosis not present

## 2013-12-04 DIAGNOSIS — I2510 Atherosclerotic heart disease of native coronary artery without angina pectoris: Secondary | ICD-10-CM | POA: Diagnosis not present

## 2013-12-10 ENCOUNTER — Telehealth: Payer: Self-pay

## 2013-12-10 NOTE — Telephone Encounter (Signed)
PA needed for Micardis. Pt reports that she was started on this by specialist after [employer] recommended that she be put on this to protect her kidneys from damage from DM. She has not tried other meds for this, but she has multiple drug allergies. Dr Everlene Farrier advised he would like to leave her on this both d/t drug allergies and also because alternative Losartan was recently shown to cause chronic cough and pt already has a chronic cough. Completed form on covermymeds.

## 2013-12-11 NOTE — Telephone Encounter (Signed)
PA approved through 07/16/14. Notified pharm and pt on VM.

## 2013-12-15 ENCOUNTER — Encounter: Payer: Self-pay | Admitting: Emergency Medicine

## 2014-01-01 ENCOUNTER — Ambulatory Visit (INDEPENDENT_AMBULATORY_CARE_PROVIDER_SITE_OTHER): Payer: Medicare Other | Admitting: Emergency Medicine

## 2014-01-01 ENCOUNTER — Ambulatory Visit: Payer: Self-pay

## 2014-01-01 VITALS — BP 124/78 | HR 74 | Temp 98.0°F | Resp 16 | Ht 62.75 in | Wt 171.4 lb

## 2014-01-01 DIAGNOSIS — J3489 Other specified disorders of nose and nasal sinuses: Secondary | ICD-10-CM | POA: Diagnosis not present

## 2014-01-01 DIAGNOSIS — R0981 Nasal congestion: Secondary | ICD-10-CM

## 2014-01-01 DIAGNOSIS — E119 Type 2 diabetes mellitus without complications: Secondary | ICD-10-CM | POA: Diagnosis not present

## 2014-01-01 DIAGNOSIS — R599 Enlarged lymph nodes, unspecified: Secondary | ICD-10-CM | POA: Insufficient documentation

## 2014-01-01 DIAGNOSIS — J029 Acute pharyngitis, unspecified: Secondary | ICD-10-CM

## 2014-01-01 DIAGNOSIS — R059 Cough, unspecified: Secondary | ICD-10-CM

## 2014-01-01 DIAGNOSIS — R221 Localized swelling, mass and lump, neck: Secondary | ICD-10-CM

## 2014-01-01 DIAGNOSIS — R05 Cough: Secondary | ICD-10-CM

## 2014-01-01 DIAGNOSIS — R220 Localized swelling, mass and lump, head: Secondary | ICD-10-CM

## 2014-01-01 LAB — GLUCOSE, POCT (MANUAL RESULT ENTRY): POC GLUCOSE: 213 mg/dL — AB (ref 70–99)

## 2014-01-01 MED ORDER — BUDESONIDE-FORMOTEROL FUMARATE 160-4.5 MCG/ACT IN AERO: 2.0000 | INHALATION_SPRAY | Freq: Two times a day (BID) | RESPIRATORY_TRACT | Status: DC

## 2014-01-01 MED ORDER — ALBUTEROL SULFATE (2.5 MG/3ML) 0.083% IN NEBU
2.5000 mg | INHALATION_SOLUTION | Freq: Once | RESPIRATORY_TRACT | Status: AC
  Administered 2014-01-01: 2.5 mg via RESPIRATORY_TRACT

## 2014-01-01 MED ORDER — IPRATROPIUM BROMIDE 0.02 % IN SOLN
0.5000 mg | Freq: Once | RESPIRATORY_TRACT | Status: AC
  Administered 2014-01-01: 0.5 mg via RESPIRATORY_TRACT

## 2014-01-01 MED ORDER — HYDROCOD POLST-CHLORPHEN POLST 10-8 MG/5ML PO LQCR: 5.0000 mL | Freq: Two times a day (BID) | ORAL | Status: DC | PRN

## 2014-01-01 MED ORDER — ALBUTEROL SULFATE HFA 108 (90 BASE) MCG/ACT IN AERS: 2.0000 | INHALATION_SPRAY | RESPIRATORY_TRACT | Status: DC | PRN

## 2014-01-01 MED ORDER — CLINDAMYCIN HCL 300 MG PO CAPS: ORAL_CAPSULE | ORAL | Status: DC

## 2014-01-01 NOTE — Progress Notes (Signed)
   Subjective:    Patient ID: Alison Friedman, female    DOB: 08-Mar-1946, 68 y.o.   MRN: [id number]  HPI 68 year old female patient presents with sinus congestion, post nasal drainage, and a cough. There is a lot of drainage down the back of her throat. Also complains of a sore throat. She started feeling ill 3-4 days ago. She started feeling the chest congestion this morning. When she coughs in the morning she produces a brownish colored phlegm. She is not blowing anything out of her nose.      Review of Systems     Objective:   Physical Exam patient has a deep coarse cough. TMs are clear nose is congested posterior pharynx is slightly red. Neck exam reveals a 1 x 1 cm right cervical node and just below this a pea-sized cervical node which is tender. Chest exam reveals breath sounds to be symmetrical with occasional wheezes noted as well as coarse rhonchi. Cardiac is regular rate without murmurs or gallops.  Results for orders placed in visit on 01/01/14  GLUCOSE, POCT (MANUAL RESULT ENTRY)      Result Value Ref Range   POC Glucose 213 (*) 70 - 99 mg/dl   Peak flow 275 Scheduled Meds: . albuterol  2.5 mg Nebulization Once   Meds ordered this encounter  Medications  . albuterol (PROVENTIL) (2.5 MG/3ML) 0.083% nebulizer solution 2.5 mg    Sig:   . clindamycin (CLEOCIN) 300 MG capsule    Sig: Take one tablet 3 times a day    Dispense:  30 capsule    Refill:  0  . chlorpheniramine-HYDROcodone (TUSSIONEX PENNKINETIC ER) 10-8 MG/5ML LQCR    Sig: Take 5 mLs by mouth every 12 (twelve) hours as needed for cough (cough).    Dispense:  60 mL    Refill:  0  . budesonide-formoterol (SYMBICORT) 160-4.5 MCG/ACT inhaler    Sig: Inhale 2 puffs into the lungs 2 (two) times daily.    Dispense:  1 Inhaler    Refill:  3  . albuterol (VENTOLIN HFA) 108 (90 BASE) MCG/ACT inhaler    Sig: Inhale 2 puffs into the lungs every 4 (four) hours as needed for wheezing or shortness of breath.    Dispense:   18 each    Refill:  4     PRN Meds:.    Assessment & Plan:  Patient has albuterol at home. We'll treat with clindamycin and Tussionex. Have scheduled a CT of her neck to followup on her adenopathy. Her last CT of the neck was in 2010. I also refilled her albuterol and Symbicort. She did have a good response to her duo neb

## 2014-01-05 ENCOUNTER — Ambulatory Visit
Admission: RE | Admit: 2014-01-05 | Discharge: 2014-01-05 | Disposition: A | Discharge status: Home / Self Care | Payer: Medicare Other | Source: Ambulatory Visit | Attending: Emergency Medicine | Admitting: Emergency Medicine

## 2014-01-05 DIAGNOSIS — M5382 Other specified dorsopathies, cervical region: Secondary | ICD-10-CM | POA: Diagnosis not present

## 2014-01-05 DIAGNOSIS — R221 Localized swelling, mass and lump, neck: Secondary | ICD-10-CM

## 2014-01-06 ENCOUNTER — Ambulatory Visit: Payer: Medicare Other | Admitting: Emergency Medicine

## 2014-01-06 ENCOUNTER — Telehealth: Payer: Self-pay | Admitting: Emergency Medicine

## 2014-01-06 NOTE — Telephone Encounter (Signed)
I discussed the situation with her.. Her CT did not show any nodes that were suspicious for cancer. They appeared to be reactive nodes. She is feeling better. We'll continue her antibiotics and reschedule her appointment for next Tuesday. We can do her followup diabetes check at that time to

## 2014-01-07 ENCOUNTER — Telehealth: Payer: Self-pay

## 2014-01-07 MED ORDER — FLUCONAZOLE 150 MG PO TABS: 150.0000 mg | ORAL_TABLET | Freq: Once | ORAL | Status: DC

## 2014-01-07 NOTE — Telephone Encounter (Signed)
Patient states that Dr. Everlene Farrier told her he would call in diflucan for a yeast infection to her pharmacy. When she went to her pharmacy to pick it up, it was not there. Please return call and advise. Thank you.

## 2014-01-07 NOTE — Telephone Encounter (Signed)
Sent in Rx for Diflucan per protocol. Pt advised.

## 2014-01-09 ENCOUNTER — Other Ambulatory Visit: Payer: Self-pay | Admitting: Emergency Medicine

## 2014-01-13 ENCOUNTER — Encounter: Payer: Self-pay | Admitting: Emergency Medicine

## 2014-01-13 ENCOUNTER — Ambulatory Visit (INDEPENDENT_AMBULATORY_CARE_PROVIDER_SITE_OTHER): Payer: Medicare Other | Admitting: Emergency Medicine

## 2014-01-13 VITALS — BP 118/69 | HR 67 | Temp 98.1°F | Resp 16 | Ht 63.0 in | Wt 171.2 lb

## 2014-01-13 DIAGNOSIS — E119 Type 2 diabetes mellitus without complications: Secondary | ICD-10-CM

## 2014-01-13 DIAGNOSIS — J323 Chronic sphenoidal sinusitis: Secondary | ICD-10-CM | POA: Diagnosis not present

## 2014-01-13 DIAGNOSIS — T782XXD Anaphylactic shock, unspecified, subsequent encounter: Secondary | ICD-10-CM

## 2014-01-13 LAB — GLUCOSE, POCT (MANUAL RESULT ENTRY): POC Glucose: 116 mg/dl — AB (ref 70–99)

## 2014-01-13 LAB — POCT GLYCOSYLATED HEMOGLOBIN (HGB A1C): HEMOGLOBIN A1C: 6.7

## 2014-01-13 MED ORDER — EPINEPHRINE 0.3 MG/0.3ML IJ SOAJ: 0.3000 mg | Freq: Once | INTRAMUSCULAR | Status: DC

## 2014-01-13 MED ORDER — METFORMIN HCL ER 500 MG PO TB24: ORAL_TABLET | ORAL | Status: DC

## 2014-01-13 MED ORDER — CLINDAMYCIN HCL 300 MG PO CAPS: ORAL_CAPSULE | ORAL | Status: DC

## 2014-01-13 NOTE — Progress Notes (Signed)
   Subjective:    Patient ID: Alison Friedman, female    DOB: [birth year], 68 y.o.   MRN: [id number]  HPI patient in for followup. She was seen last week with an acute upper rest or infection with sinusitis. She was treated with Cleocin and cough medication. Since that time she is improved but is still not 100%. She did undergo a CT of the neck which did disclose some evidence of sphenoid sinusitis. She has reactive lymphadenopathy noted on this scan.    Review of Systems     Objective:   Physical Exam patient is alert and cooperative and much improved from her last visit. There is significant nasal congestion blue turbinates on the left. TMs are clear. Throat is normal neck supple there is some shoddy adenopathy in the posterior cervical area. Her chest was clear to auscultation    Results for orders placed in visit on 01/13/14  GLUCOSE, POCT (MANUAL RESULT ENTRY)      Result Value Ref Range   POC Glucose 116 (*) 70 - 99 mg/dl  POCT GLYCOSYLATED HEMOGLOBIN (HGB A1C)      Result Value Ref Range   Hemoglobin A1C 6.7        Assessment & Plan:  She was given a prescription for Cleocin to keep on hand. Referral has been made to ENT for their help. Diabetes is under acceptable control.

## 2014-01-16 ENCOUNTER — Telehealth: Payer: Self-pay

## 2014-01-16 NOTE — Telephone Encounter (Signed)
PA needed for Symbicort. Pt advised she had tried QVAR in the past and was then changed to Symbicort which has worked well for her. She has allergies/reactions to many meds and would rather not try something else.

## 2014-01-19 ENCOUNTER — Other Ambulatory Visit: Payer: Self-pay | Admitting: Emergency Medicine

## 2014-01-19 DIAGNOSIS — R9389 Abnormal findings on diagnostic imaging of other specified body structures: Secondary | ICD-10-CM

## 2014-01-19 DIAGNOSIS — R490 Dysphonia: Secondary | ICD-10-CM

## 2014-01-19 MED ORDER — MOMETASONE FURO-FORMOTEROL FUM 200-5 MCG/ACT IN AERO: 2.0000 | INHALATION_SPRAY | Freq: Two times a day (BID) | RESPIRATORY_TRACT | Status: DC

## 2014-01-19 NOTE — Telephone Encounter (Signed)
Called [employer] and completed the PA. Symbicort will not be covered unless provider can state that ALL formulary alternatives will be ineffective and therefore Symbicort is medically necessary. I asked if they wouldn't take into consideration that pt has many medication allergies and she has been stable on Symbicort w/no adverse effects, and they stated that the above would have to be stated by provider. QVAR has been ineffective for pt in past but has not tried Advair, Flovent, Asmanex or Dulera which are the other alternatives. Dr Everlene Farrier, do you want to Rx one of the others for pt to try?

## 2014-01-19 NOTE — Telephone Encounter (Signed)
No . Put down and the patient is intolerant of multiple medications .Marland Kitchen I am very concerned about the toxicity of a powdered inhalation such as Advair.. She has not responded to steroid inhalers in the past.  Either Symbicort are  dulera would be the drugs of choice

## 2014-01-19 NOTE — Telephone Encounter (Signed)
Spoke w/Dr Everlene Farrier concerning Alison Friedman. He stated that he just doesn't want pt on Advair d/t chance of reaction to powder. He gave me VO to send in Rx for max strength Dulera in place of the Symbicort. Sent in Rx and called pt to explain. She agreed to try it and will CB if she has any problems or ineffective.

## 2014-01-22 DIAGNOSIS — M546 Pain in thoracic spine: Secondary | ICD-10-CM | POA: Diagnosis not present

## 2014-01-22 DIAGNOSIS — M999 Biomechanical lesion, unspecified: Secondary | ICD-10-CM | POA: Diagnosis not present

## 2014-01-22 DIAGNOSIS — IMO0002 Reserved for concepts with insufficient information to code with codable children: Secondary | ICD-10-CM | POA: Diagnosis not present

## 2014-01-22 DIAGNOSIS — M47814 Spondylosis without myelopathy or radiculopathy, thoracic region: Secondary | ICD-10-CM | POA: Diagnosis not present

## 2014-01-29 DIAGNOSIS — IMO0002 Reserved for concepts with insufficient information to code with codable children: Secondary | ICD-10-CM | POA: Diagnosis not present

## 2014-01-29 DIAGNOSIS — M546 Pain in thoracic spine: Secondary | ICD-10-CM | POA: Diagnosis not present

## 2014-01-29 DIAGNOSIS — M999 Biomechanical lesion, unspecified: Secondary | ICD-10-CM | POA: Diagnosis not present

## 2014-01-29 DIAGNOSIS — M47814 Spondylosis without myelopathy or radiculopathy, thoracic region: Secondary | ICD-10-CM | POA: Diagnosis not present

## 2014-02-05 DIAGNOSIS — IMO0002 Reserved for concepts with insufficient information to code with codable children: Secondary | ICD-10-CM | POA: Diagnosis not present

## 2014-02-05 DIAGNOSIS — M546 Pain in thoracic spine: Secondary | ICD-10-CM | POA: Diagnosis not present

## 2014-02-05 DIAGNOSIS — M47814 Spondylosis without myelopathy or radiculopathy, thoracic region: Secondary | ICD-10-CM | POA: Diagnosis not present

## 2014-02-05 DIAGNOSIS — M999 Biomechanical lesion, unspecified: Secondary | ICD-10-CM | POA: Diagnosis not present

## 2014-02-12 DIAGNOSIS — IMO0002 Reserved for concepts with insufficient information to code with codable children: Secondary | ICD-10-CM | POA: Diagnosis not present

## 2014-02-12 DIAGNOSIS — M999 Biomechanical lesion, unspecified: Secondary | ICD-10-CM | POA: Diagnosis not present

## 2014-02-12 DIAGNOSIS — M546 Pain in thoracic spine: Secondary | ICD-10-CM | POA: Diagnosis not present

## 2014-02-12 DIAGNOSIS — M47814 Spondylosis without myelopathy or radiculopathy, thoracic region: Secondary | ICD-10-CM | POA: Diagnosis not present

## 2014-02-26 DIAGNOSIS — IMO0002 Reserved for concepts with insufficient information to code with codable children: Secondary | ICD-10-CM | POA: Diagnosis not present

## 2014-02-26 DIAGNOSIS — M47814 Spondylosis without myelopathy or radiculopathy, thoracic region: Secondary | ICD-10-CM | POA: Diagnosis not present

## 2014-02-26 DIAGNOSIS — M546 Pain in thoracic spine: Secondary | ICD-10-CM | POA: Diagnosis not present

## 2014-02-26 DIAGNOSIS — M999 Biomechanical lesion, unspecified: Secondary | ICD-10-CM | POA: Diagnosis not present

## 2014-03-05 DIAGNOSIS — IMO0002 Reserved for concepts with insufficient information to code with codable children: Secondary | ICD-10-CM | POA: Diagnosis not present

## 2014-03-05 DIAGNOSIS — M546 Pain in thoracic spine: Secondary | ICD-10-CM | POA: Diagnosis not present

## 2014-03-05 DIAGNOSIS — M47814 Spondylosis without myelopathy or radiculopathy, thoracic region: Secondary | ICD-10-CM | POA: Diagnosis not present

## 2014-03-05 DIAGNOSIS — M999 Biomechanical lesion, unspecified: Secondary | ICD-10-CM | POA: Diagnosis not present

## 2014-03-12 ENCOUNTER — Ambulatory Visit: Payer: Medicare Other | Admitting: Family Medicine

## 2014-03-12 DIAGNOSIS — M546 Pain in thoracic spine: Secondary | ICD-10-CM | POA: Diagnosis not present

## 2014-03-12 DIAGNOSIS — IMO0002 Reserved for concepts with insufficient information to code with codable children: Secondary | ICD-10-CM | POA: Diagnosis not present

## 2014-03-12 DIAGNOSIS — M47814 Spondylosis without myelopathy or radiculopathy, thoracic region: Secondary | ICD-10-CM | POA: Diagnosis not present

## 2014-03-12 DIAGNOSIS — M999 Biomechanical lesion, unspecified: Secondary | ICD-10-CM | POA: Diagnosis not present

## 2014-03-19 DIAGNOSIS — M546 Pain in thoracic spine: Secondary | ICD-10-CM | POA: Diagnosis not present

## 2014-03-19 DIAGNOSIS — M999 Biomechanical lesion, unspecified: Secondary | ICD-10-CM | POA: Diagnosis not present

## 2014-03-19 DIAGNOSIS — M47814 Spondylosis without myelopathy or radiculopathy, thoracic region: Secondary | ICD-10-CM | POA: Diagnosis not present

## 2014-03-19 DIAGNOSIS — IMO0002 Reserved for concepts with insufficient information to code with codable children: Secondary | ICD-10-CM | POA: Diagnosis not present

## 2014-03-24 DIAGNOSIS — M47814 Spondylosis without myelopathy or radiculopathy, thoracic region: Secondary | ICD-10-CM | POA: Diagnosis not present

## 2014-03-24 DIAGNOSIS — M999 Biomechanical lesion, unspecified: Secondary | ICD-10-CM | POA: Diagnosis not present

## 2014-03-24 DIAGNOSIS — IMO0002 Reserved for concepts with insufficient information to code with codable children: Secondary | ICD-10-CM | POA: Diagnosis not present

## 2014-03-24 DIAGNOSIS — M546 Pain in thoracic spine: Secondary | ICD-10-CM | POA: Diagnosis not present

## 2014-03-27 DIAGNOSIS — E782 Mixed hyperlipidemia: Secondary | ICD-10-CM | POA: Diagnosis not present

## 2014-04-02 DIAGNOSIS — E119 Type 2 diabetes mellitus without complications: Secondary | ICD-10-CM | POA: Diagnosis not present

## 2014-04-02 DIAGNOSIS — M546 Pain in thoracic spine: Secondary | ICD-10-CM | POA: Diagnosis not present

## 2014-04-02 DIAGNOSIS — M47814 Spondylosis without myelopathy or radiculopathy, thoracic region: Secondary | ICD-10-CM | POA: Diagnosis not present

## 2014-04-02 DIAGNOSIS — IMO0002 Reserved for concepts with insufficient information to code with codable children: Secondary | ICD-10-CM | POA: Diagnosis not present

## 2014-04-02 DIAGNOSIS — E559 Vitamin D deficiency, unspecified: Secondary | ICD-10-CM | POA: Diagnosis not present

## 2014-04-02 DIAGNOSIS — Z888 Allergy status to other drugs, medicaments and biological substances status: Secondary | ICD-10-CM | POA: Diagnosis not present

## 2014-04-02 DIAGNOSIS — E782 Mixed hyperlipidemia: Secondary | ICD-10-CM | POA: Diagnosis not present

## 2014-04-02 DIAGNOSIS — M999 Biomechanical lesion, unspecified: Secondary | ICD-10-CM | POA: Diagnosis not present

## 2014-04-09 DIAGNOSIS — IMO0002 Reserved for concepts with insufficient information to code with codable children: Secondary | ICD-10-CM | POA: Diagnosis not present

## 2014-04-09 DIAGNOSIS — M47814 Spondylosis without myelopathy or radiculopathy, thoracic region: Secondary | ICD-10-CM | POA: Diagnosis not present

## 2014-04-09 DIAGNOSIS — M999 Biomechanical lesion, unspecified: Secondary | ICD-10-CM | POA: Diagnosis not present

## 2014-04-09 DIAGNOSIS — M546 Pain in thoracic spine: Secondary | ICD-10-CM | POA: Diagnosis not present

## 2014-04-14 ENCOUNTER — Ambulatory Visit (INDEPENDENT_AMBULATORY_CARE_PROVIDER_SITE_OTHER): Payer: Medicare Other | Admitting: Emergency Medicine

## 2014-04-14 VITALS — BP 134/74 | HR 63 | Temp 98.0°F | Resp 16 | Ht 63.0 in | Wt 168.0 lb

## 2014-04-14 DIAGNOSIS — E782 Mixed hyperlipidemia: Secondary | ICD-10-CM

## 2014-04-14 DIAGNOSIS — E119 Type 2 diabetes mellitus without complications: Secondary | ICD-10-CM

## 2014-04-14 DIAGNOSIS — I10 Essential (primary) hypertension: Secondary | ICD-10-CM | POA: Diagnosis not present

## 2014-04-14 DIAGNOSIS — R599 Enlarged lymph nodes, unspecified: Secondary | ICD-10-CM | POA: Diagnosis not present

## 2014-04-14 DIAGNOSIS — Z733 Stress, not elsewhere classified: Secondary | ICD-10-CM

## 2014-04-14 DIAGNOSIS — F439 Reaction to severe stress, unspecified: Secondary | ICD-10-CM

## 2014-04-14 DIAGNOSIS — R591 Generalized enlarged lymph nodes: Secondary | ICD-10-CM

## 2014-04-14 LAB — POCT GLYCOSYLATED HEMOGLOBIN (HGB A1C): Hemoglobin A1C: 6.5

## 2014-04-14 LAB — GLUCOSE, POCT (MANUAL RESULT ENTRY): POC Glucose: 107 mg/dl — AB (ref 70–99)

## 2014-04-14 MED ORDER — ALPRAZOLAM 0.5 MG PO TABS: 0.5000 mg | ORAL_TABLET | ORAL | Status: DC | PRN

## 2014-04-14 NOTE — Progress Notes (Signed)
   Subjective:    Patient ID: Alison Friedman, female    DOB: Apr 04, 1946, 68 y.o.   MRN: [id number]  HPI patient in for followup of her sinusitis and bronchitis. She never was able to see the ENT. She states she was never notified of the appointment. Her CT scan done showed a 38mm node on the right side of her neck as well as some evidence of sphenoid sinusitis.    Review of Systems     Objective:   Physical Exam  Constitutional: She appears well-developed.  HENT:  Head: Normocephalic.  There is a 1 cm firm area along the sternocleidomastoid on the right  Eyes: Pupils are equal, round, and reactive to light.  Neck: Normal range of motion. Neck supple.  Cardiovascular: Normal rate and regular rhythm.   Pulmonary/Chest: Effort normal and breath sounds normal. No respiratory distress.  Abdominal: Soft. There is no tenderness.   Results for orders placed in visit on 04/14/14  GLUCOSE, POCT (MANUAL RESULT ENTRY)      Result Value Ref Range   POC Glucose 107 (*) 70 - 99 mg/dl  POCT GLYCOSYLATED HEMOGLOBIN (HGB A1C)      Result Value Ref Range   Hemoglobin A1C 6.5            Assessment & Plan:    HBA1C at 6.5 she declined a flu shot today. Referral made back to ENT. She was told to call me in one week if she does not get notified of the referral. I was hesitant to give her the Prevnar vaccine because of her history of severe allergic type reactions.

## 2014-04-28 DIAGNOSIS — M47814 Spondylosis without myelopathy or radiculopathy, thoracic region: Secondary | ICD-10-CM | POA: Diagnosis not present

## 2014-04-28 DIAGNOSIS — M9902 Segmental and somatic dysfunction of thoracic region: Secondary | ICD-10-CM | POA: Diagnosis not present

## 2014-04-28 DIAGNOSIS — M5134 Other intervertebral disc degeneration, thoracic region: Secondary | ICD-10-CM | POA: Diagnosis not present

## 2014-04-28 DIAGNOSIS — M546 Pain in thoracic spine: Secondary | ICD-10-CM | POA: Diagnosis not present

## 2014-05-04 DIAGNOSIS — J323 Chronic sphenoidal sinusitis: Secondary | ICD-10-CM | POA: Diagnosis not present

## 2014-05-04 DIAGNOSIS — M25561 Pain in right knee: Secondary | ICD-10-CM | POA: Diagnosis not present

## 2014-05-04 DIAGNOSIS — R590 Localized enlarged lymph nodes: Secondary | ICD-10-CM | POA: Diagnosis not present

## 2014-05-12 DIAGNOSIS — M25561 Pain in right knee: Secondary | ICD-10-CM | POA: Diagnosis not present

## 2014-05-15 ENCOUNTER — Other Ambulatory Visit: Payer: Self-pay | Admitting: Emergency Medicine

## 2014-05-19 DIAGNOSIS — M1711 Unilateral primary osteoarthritis, right knee: Secondary | ICD-10-CM | POA: Diagnosis not present

## 2014-05-19 DIAGNOSIS — S83232D Complex tear of medial meniscus, current injury, left knee, subsequent encounter: Secondary | ICD-10-CM | POA: Diagnosis not present

## 2014-05-19 DIAGNOSIS — S83271D Complex tear of lateral meniscus, current injury, right knee, subsequent encounter: Secondary | ICD-10-CM | POA: Diagnosis not present

## 2014-06-23 ENCOUNTER — Telehealth: Payer: Self-pay | Admitting: Family Medicine

## 2014-06-23 NOTE — Telephone Encounter (Signed)
Left a message for patient to either come by and get a flu shot or call us and let us know if and where she has received her flu shot so that we could update her record.

## 2014-06-29 DIAGNOSIS — Z803 Family history of malignant neoplasm of breast: Secondary | ICD-10-CM | POA: Diagnosis not present

## 2014-06-29 DIAGNOSIS — Z1231 Encounter for screening mammogram for malignant neoplasm of breast: Secondary | ICD-10-CM | POA: Diagnosis not present

## 2014-07-13 ENCOUNTER — Encounter: Payer: Self-pay | Admitting: Emergency Medicine

## 2014-07-14 ENCOUNTER — Ambulatory Visit (INDEPENDENT_AMBULATORY_CARE_PROVIDER_SITE_OTHER): Payer: Medicare Other | Admitting: Emergency Medicine

## 2014-07-14 ENCOUNTER — Encounter: Payer: Self-pay | Admitting: Emergency Medicine

## 2014-07-14 VITALS — BP 130/86 | HR 65 | Temp 98.3°F | Resp 16 | Ht 63.0 in | Wt 167.8 lb

## 2014-07-14 DIAGNOSIS — I809 Phlebitis and thrombophlebitis of unspecified site: Secondary | ICD-10-CM | POA: Diagnosis not present

## 2014-07-14 DIAGNOSIS — E119 Type 2 diabetes mellitus without complications: Secondary | ICD-10-CM | POA: Diagnosis not present

## 2014-07-14 DIAGNOSIS — E782 Mixed hyperlipidemia: Secondary | ICD-10-CM | POA: Diagnosis not present

## 2014-07-14 LAB — COMPLETE METABOLIC PANEL WITH GFR
ALBUMIN: 4.6 g/dL (ref 3.5–5.2)
ALT: 13 U/L (ref 0–35)
AST: 14 U/L (ref 0–37)
Alkaline Phosphatase: 99 U/L (ref 39–117)
BILIRUBIN TOTAL: 0.9 mg/dL (ref 0.2–1.2)
BUN: 16 mg/dL (ref 6–23)
CO2: 27 mEq/L (ref 19–32)
Calcium: 10.2 mg/dL (ref 8.4–10.5)
Chloride: 101 mEq/L (ref 96–112)
Creat: 0.93 mg/dL (ref 0.50–1.10)
GFR, EST AFRICAN AMERICAN: 73 mL/min
GFR, EST NON AFRICAN AMERICAN: 63 mL/min
Glucose, Bld: 102 mg/dL — ABNORMAL HIGH (ref 70–99)
Potassium: 4.9 mEq/L (ref 3.5–5.3)
SODIUM: 137 meq/L (ref 135–145)
TOTAL PROTEIN: 7.4 g/dL (ref 6.0–8.3)

## 2014-07-14 LAB — LIPID PANEL
Cholesterol: 223 mg/dL — ABNORMAL HIGH (ref 0–200)
HDL: 42 mg/dL (ref >39)
LDL CALC: 143 mg/dL — AB (ref 0–99)
Total CHOL/HDL Ratio: 5.3 Ratio
Triglycerides: 189 mg/dL — ABNORMAL HIGH (ref <150)
VLDL: 38 mg/dL (ref 0–40)

## 2014-07-14 LAB — GLUCOSE, POCT (MANUAL RESULT ENTRY): POC Glucose: 105 mg/dl — AB (ref 70–99)

## 2014-07-14 LAB — POCT GLYCOSYLATED HEMOGLOBIN (HGB A1C): Hemoglobin A1C: 6.7

## 2014-07-14 NOTE — Progress Notes (Signed)
Subjective:    Patient ID: Alison Friedman, female    DOB: 1946/07/17, 68 y.o.   MRN: [id number]  HPI Patient presents for follow up for DM and dyslipidemia with complaint of left leg pain.  Has been helping take care of daughter who is going through chemo and radiation for breast cancer with mets to lymph nodes and has not been able to keep up with diet. Has not been eating out, just not making good choices. Is not currently exercising. Does not check glucose at home often, but gets numbers that range 120s-130s.  Has had left leg pain for past month and was concerned as she had DVT in left calf when she was 27. Does not feel the same, but wanted to make sure it wasn't another DVT. No erythema, swelling, or gait change. Has pain when touched and discomfort at rest and with movement. No medications tried for relief.  Review of Systems  Constitutional: Negative for fever, activity change and fatigue.  Respiratory: Negative for cough, shortness of breath and wheezing.   Cardiovascular: Negative for chest pain, palpitations and leg swelling.  Gastrointestinal: Negative for nausea, vomiting and diarrhea.  Musculoskeletal: Negative for myalgias, joint swelling, arthralgias and gait problem.  Skin: Negative for color change, pallor and rash.  Neurological: Negative for dizziness, weakness, light-headedness, numbness and headaches.  Hematological: Negative for adenopathy. Does not bruise/bleed easily.       Objective:   Physical Exam  Constitutional: She is oriented to person, place, and time. She appears well-developed and well-nourished. No distress.  Blood pressure 130/86, pulse 65, temperature 98.3 F (36.8 C), temperature source Oral, resp. rate 16, height 5\' 3"  (1.6 m), weight 167 lb 12.8 oz (76.114 kg), SpO2 99 %.  HENT:  Head: Normocephalic and atraumatic.  Right Ear: External ear normal.  Left Ear: External ear normal.  Nose: Nose normal.  Mouth/Throat: Oropharynx is clear and  moist.  Eyes: Conjunctivae are normal. Pupils are equal, round, and reactive to light. Right eye exhibits no discharge. Left eye exhibits no discharge. No scleral icterus.  Neck: Neck supple. No JVD present. No thyromegaly present.  Cardiovascular: Normal rate, regular rhythm, normal heart sounds and intact distal pulses.  Exam reveals no gallop and no friction rub.   No murmur heard. Pulmonary/Chest: Effort normal and breath sounds normal. No respiratory distress. She has no decreased breath sounds. She has no wheezes. She has no rhonchi. She has no rales.  Musculoskeletal: She exhibits tenderness (long greater saphenous vein. Is hard, but no swelling, warmth, or erythema associated.). She exhibits no edema.  Lymphadenopathy:    She has no cervical adenopathy.  Neurological: She is alert and oriented to person, place, and time.  Skin: Skin is warm and dry. No rash noted. She is not diaphoretic. No erythema. No pallor.   Diabetic Foot Exam - Simple   Simple Foot Form  Diabetic Foot exam was performed with the following findings:  Yes 07/14/2014  2:10 PM  Visual Inspection  Sensation Testing  Pulse Check  Comments  Pedal pulses felt on exam     Results for orders placed or performed in visit on 07/14/14  POCT glucose (manual entry)  Result Value Ref Range   POC Glucose 105 (A) 70 - 99 mg/dl  POCT glycosylated hemoglobin (Hb A1C)  Result Value Ref Range   Hemoglobin A1C 6.7       Assessment & Plan:  1. Diabetes mellitus without complication Lifestyle modifications discussed with close attention to  snack choices. - HM Diabetes Foot Exam - POCT glucose (manual entry) - POCT glycosylated hemoglobin (Hb A1C) - COMPLETE METABOLIC PANEL WITH GFR  2. Mixed hyperlipidemia - Lipid panel  3. Phlebitis Suggested elevation and warm compresses. Can use tylenol for pain for short time as do not want to negatively impact diabetes. - Lower Extremity Venous Duplex Left; Future   Alveta Heimlich PA-C  [facility] 07/14/2014 3:10 PM

## 2014-07-15 ENCOUNTER — Other Ambulatory Visit (HOSPITAL_COMMUNITY): Payer: Medicare Other

## 2014-07-16 ENCOUNTER — Ambulatory Visit (HOSPITAL_COMMUNITY)
Admission: RE | Admit: 2014-07-16 | Discharge: 2014-07-16 | Disposition: A | Discharge status: Home / Self Care | Payer: Medicare Other | Source: Ambulatory Visit | Attending: Vascular Surgery | Admitting: Vascular Surgery

## 2014-07-16 ENCOUNTER — Telehealth: Payer: Self-pay | Admitting: *Deleted

## 2014-07-16 DIAGNOSIS — M25569 Pain in unspecified knee: Secondary | ICD-10-CM | POA: Diagnosis not present

## 2014-07-16 DIAGNOSIS — I809 Phlebitis and thrombophlebitis of unspecified site: Secondary | ICD-10-CM | POA: Diagnosis not present

## 2014-07-16 DIAGNOSIS — M79605 Pain in left leg: Secondary | ICD-10-CM | POA: Diagnosis not present

## 2014-07-16 NOTE — Telephone Encounter (Signed)
Mickel Baas from [facility] called due to pt is insisting that the leg pain was in her right leg not her left. Per OV notes it was her left leg. Spoke to Dr. Tamala Julian who authorized a bilateral doppler study. Trenton Gammon of change in order. Mickel Baas states the patient may refuse the left leg and that would be ok for her to change the order.   FYI

## 2014-07-22 DIAGNOSIS — S0083XA Contusion of other part of head, initial encounter: Secondary | ICD-10-CM | POA: Diagnosis not present

## 2014-07-22 DIAGNOSIS — S0990XA Unspecified injury of head, initial encounter: Secondary | ICD-10-CM | POA: Diagnosis not present

## 2014-07-22 DIAGNOSIS — S0081XA Abrasion of other part of head, initial encounter: Secondary | ICD-10-CM | POA: Diagnosis not present

## 2014-07-22 DIAGNOSIS — S62308A Unspecified fracture of other metacarpal bone, initial encounter for closed fracture: Secondary | ICD-10-CM | POA: Diagnosis not present

## 2014-07-27 ENCOUNTER — Telehealth: Payer: Self-pay | Admitting: *Deleted

## 2014-07-27 NOTE — Telephone Encounter (Signed)
Patient not contacted regarding flu shot.

## 2014-08-11 DIAGNOSIS — K5900 Constipation, unspecified: Secondary | ICD-10-CM | POA: Diagnosis not present

## 2014-08-11 DIAGNOSIS — K589 Irritable bowel syndrome without diarrhea: Secondary | ICD-10-CM | POA: Diagnosis not present

## 2014-08-25 DIAGNOSIS — M546 Pain in thoracic spine: Secondary | ICD-10-CM | POA: Diagnosis not present

## 2014-08-25 DIAGNOSIS — M5134 Other intervertebral disc degeneration, thoracic region: Secondary | ICD-10-CM | POA: Diagnosis not present

## 2014-08-25 DIAGNOSIS — M9902 Segmental and somatic dysfunction of thoracic region: Secondary | ICD-10-CM | POA: Diagnosis not present

## 2014-08-25 DIAGNOSIS — M47814 Spondylosis without myelopathy or radiculopathy, thoracic region: Secondary | ICD-10-CM | POA: Diagnosis not present

## 2014-08-27 DIAGNOSIS — M47814 Spondylosis without myelopathy or radiculopathy, thoracic region: Secondary | ICD-10-CM | POA: Diagnosis not present

## 2014-08-27 DIAGNOSIS — M5134 Other intervertebral disc degeneration, thoracic region: Secondary | ICD-10-CM | POA: Diagnosis not present

## 2014-08-27 DIAGNOSIS — M546 Pain in thoracic spine: Secondary | ICD-10-CM | POA: Diagnosis not present

## 2014-08-27 DIAGNOSIS — M9902 Segmental and somatic dysfunction of thoracic region: Secondary | ICD-10-CM | POA: Diagnosis not present

## 2014-09-01 DIAGNOSIS — M5134 Other intervertebral disc degeneration, thoracic region: Secondary | ICD-10-CM | POA: Diagnosis not present

## 2014-09-01 DIAGNOSIS — M47814 Spondylosis without myelopathy or radiculopathy, thoracic region: Secondary | ICD-10-CM | POA: Diagnosis not present

## 2014-09-01 DIAGNOSIS — M546 Pain in thoracic spine: Secondary | ICD-10-CM | POA: Diagnosis not present

## 2014-09-01 DIAGNOSIS — M9902 Segmental and somatic dysfunction of thoracic region: Secondary | ICD-10-CM | POA: Diagnosis not present

## 2014-09-03 DIAGNOSIS — M47814 Spondylosis without myelopathy or radiculopathy, thoracic region: Secondary | ICD-10-CM | POA: Diagnosis not present

## 2014-09-03 DIAGNOSIS — M9902 Segmental and somatic dysfunction of thoracic region: Secondary | ICD-10-CM | POA: Diagnosis not present

## 2014-09-03 DIAGNOSIS — M546 Pain in thoracic spine: Secondary | ICD-10-CM | POA: Diagnosis not present

## 2014-09-03 DIAGNOSIS — M5134 Other intervertebral disc degeneration, thoracic region: Secondary | ICD-10-CM | POA: Diagnosis not present

## 2014-09-15 DIAGNOSIS — M9902 Segmental and somatic dysfunction of thoracic region: Secondary | ICD-10-CM | POA: Diagnosis not present

## 2014-09-15 DIAGNOSIS — M47814 Spondylosis without myelopathy or radiculopathy, thoracic region: Secondary | ICD-10-CM | POA: Diagnosis not present

## 2014-09-15 DIAGNOSIS — M546 Pain in thoracic spine: Secondary | ICD-10-CM | POA: Diagnosis not present

## 2014-09-15 DIAGNOSIS — M5134 Other intervertebral disc degeneration, thoracic region: Secondary | ICD-10-CM | POA: Diagnosis not present

## 2014-09-17 DIAGNOSIS — M47814 Spondylosis without myelopathy or radiculopathy, thoracic region: Secondary | ICD-10-CM | POA: Diagnosis not present

## 2014-09-17 DIAGNOSIS — M9902 Segmental and somatic dysfunction of thoracic region: Secondary | ICD-10-CM | POA: Diagnosis not present

## 2014-09-17 DIAGNOSIS — M5134 Other intervertebral disc degeneration, thoracic region: Secondary | ICD-10-CM | POA: Diagnosis not present

## 2014-09-17 DIAGNOSIS — M546 Pain in thoracic spine: Secondary | ICD-10-CM | POA: Diagnosis not present

## 2014-09-24 DIAGNOSIS — M47814 Spondylosis without myelopathy or radiculopathy, thoracic region: Secondary | ICD-10-CM | POA: Diagnosis not present

## 2014-09-24 DIAGNOSIS — M9902 Segmental and somatic dysfunction of thoracic region: Secondary | ICD-10-CM | POA: Diagnosis not present

## 2014-09-24 DIAGNOSIS — M546 Pain in thoracic spine: Secondary | ICD-10-CM | POA: Diagnosis not present

## 2014-09-24 DIAGNOSIS — M5134 Other intervertebral disc degeneration, thoracic region: Secondary | ICD-10-CM | POA: Diagnosis not present

## 2014-10-01 DIAGNOSIS — M47814 Spondylosis without myelopathy or radiculopathy, thoracic region: Secondary | ICD-10-CM | POA: Diagnosis not present

## 2014-10-01 DIAGNOSIS — M5134 Other intervertebral disc degeneration, thoracic region: Secondary | ICD-10-CM | POA: Diagnosis not present

## 2014-10-01 DIAGNOSIS — M9902 Segmental and somatic dysfunction of thoracic region: Secondary | ICD-10-CM | POA: Diagnosis not present

## 2014-10-01 DIAGNOSIS — M546 Pain in thoracic spine: Secondary | ICD-10-CM | POA: Diagnosis not present

## 2014-10-08 DIAGNOSIS — M47814 Spondylosis without myelopathy or radiculopathy, thoracic region: Secondary | ICD-10-CM | POA: Diagnosis not present

## 2014-10-08 DIAGNOSIS — M546 Pain in thoracic spine: Secondary | ICD-10-CM | POA: Diagnosis not present

## 2014-10-08 DIAGNOSIS — M5134 Other intervertebral disc degeneration, thoracic region: Secondary | ICD-10-CM | POA: Diagnosis not present

## 2014-10-08 DIAGNOSIS — M9902 Segmental and somatic dysfunction of thoracic region: Secondary | ICD-10-CM | POA: Diagnosis not present

## 2014-10-13 ENCOUNTER — Ambulatory Visit (INDEPENDENT_AMBULATORY_CARE_PROVIDER_SITE_OTHER): Payer: Medicare Other | Admitting: Emergency Medicine

## 2014-10-13 ENCOUNTER — Encounter: Payer: Self-pay | Admitting: Emergency Medicine

## 2014-10-13 VITALS — BP 122/79 | HR 78 | Temp 97.9°F | Resp 16 | Ht 63.0 in | Wt 168.8 lb

## 2014-10-13 DIAGNOSIS — E119 Type 2 diabetes mellitus without complications: Secondary | ICD-10-CM | POA: Diagnosis not present

## 2014-10-13 DIAGNOSIS — J209 Acute bronchitis, unspecified: Secondary | ICD-10-CM | POA: Diagnosis not present

## 2014-10-13 LAB — LIPID PANEL
CHOL/HDL RATIO: 5.2 ratio
Cholesterol: 214 mg/dL — ABNORMAL HIGH (ref 0–200)
HDL: 41 mg/dL — AB (ref >=46)
LDL Cholesterol: 138 mg/dL — ABNORMAL HIGH (ref 0–99)
TRIGLYCERIDES: 175 mg/dL — AB (ref <150)
VLDL: 35 mg/dL (ref 0–40)

## 2014-10-13 LAB — COMPLETE METABOLIC PANEL WITH GFR
ALBUMIN: 4.3 g/dL (ref 3.5–5.2)
ALT: 13 U/L (ref 0–35)
AST: 17 U/L (ref 0–37)
Alkaline Phosphatase: 89 U/L (ref 39–117)
BUN: 20 mg/dL (ref 6–23)
CHLORIDE: 102 meq/L (ref 96–112)
CO2: 24 mEq/L (ref 19–32)
CREATININE: 0.91 mg/dL (ref 0.50–1.10)
Calcium: 10.1 mg/dL (ref 8.4–10.5)
GFR, Est African American: 75 mL/min
GFR, Est Non African American: 65 mL/min
Glucose, Bld: 121 mg/dL — ABNORMAL HIGH (ref 70–99)
POTASSIUM: 4.5 meq/L (ref 3.5–5.3)
SODIUM: 136 meq/L (ref 135–145)
Total Bilirubin: 0.8 mg/dL (ref 0.2–1.2)
Total Protein: 7.2 g/dL (ref 6.0–8.3)

## 2014-10-13 LAB — POCT GLYCOSYLATED HEMOGLOBIN (HGB A1C): Hemoglobin A1C: 6.5

## 2014-10-13 LAB — GLUCOSE, POCT (MANUAL RESULT ENTRY): POC Glucose: 120 mg/dl — AB (ref 70–99)

## 2014-10-13 MED ORDER — ALPRAZOLAM 0.25 MG PO TABS: ORAL_TABLET | ORAL | Status: DC

## 2014-10-13 MED ORDER — CLINDAMYCIN HCL 300 MG PO CAPS: 300.0000 mg | ORAL_CAPSULE | Freq: Three times a day (TID) | ORAL | Status: DC

## 2014-10-13 NOTE — Progress Notes (Addendum)
Subjective:  This chart was scribed for [name], MD by Ladene Artist, ED Scribe. The patient was seen in room 22. Patient's care was started at 1:33 PM.   Patient ID: Alison Friedman, female    DOB: [birth year], 69 y.o.   MRN: [id number]  Chief Complaint  Patient presents with  . Diabetes  . Shortness of Breath  . Cough   HPI  HPI Comments: Alison Friedman is a 69 y.o. female, with a h/o DM, who presents to the [facility] complaining of gradually worsening productive cough with mucous. She has been seen by ENT who advised pt to return if symptoms worsened. Pt states that she has been treating with Mucinex.   R Knee Pain Pt reports intermittent R knee pain that she attributes to a torn meniscus. She was supposed to have surgery but has not been able to due to her daughter's illness. Pt states that she has been taking prescribed pain medication for relief.   Emotional  Pt reports feeling overwhelmed due to all of the obstacles that are occuring in her life right now. Her daughter with breast CA recently finished chemotherapy 4 days ago. Pt states that she takes time for herself and reads for leisure as a coping mechanism.   Past Medical History  Diagnosis Date  . Allergy   . Diabetes mellitus without complication    Current Outpatient Prescriptions on File Prior to Visit  Medication Sig Dispense Refill  . albuterol (VENTOLIN HFA) 108 (90 BASE) MCG/ACT inhaler Inhale 2 puffs into the lungs every 4 (four) hours as needed for wheezing or shortness of breath. 18 each 4  . ALPRAZolam (XANAX) 0.5 MG tablet Take 1 tablet (0.5 mg total) by mouth as needed. 30 tablet 5  . dicyclomine (BENTYL) 20 MG tablet TAKE ONE TABLET BY MOUTH EVERY 6 HOURS 40 tablet 5  . EPINEPHrine 0.3 mg/0.3 mL IJ SOAJ injection Inject 0.3 mLs (0.3 mg total) into the muscle once. 1 Device 5  . fluticasone (FLONASE) 50 MCG/ACT nasal spray USE TWO SPRAY IN EACH NOSTRIL EVERY DAY 16 g 4  . metFORMIN  (GLUCOPHAGE-XR) 500 MG 24 hr tablet TAKE TWO TABLETS BY MOUTH TWICE DAILY 120 tablet 4  . mometasone-formoterol (DULERA) 200-5 MCG/ACT AERO Inhale 2 puffs into the lungs 2 (two) times daily. 1 Inhaler 3  . pravastatin (PRAVACHOL) 40 MG tablet TAKE ONE TABLET BY MOUTH ONCE DAILY. 30 tablet 4  . budesonide-formoterol (SYMBICORT) 160-4.5 MCG/ACT inhaler Inhale 2 puffs into the lungs 2 (two) times daily. (Patient not taking: Reported on 07/14/2014) 1 Inhaler 3  . Pitavastatin Calcium (LIVALO) 2 MG TABS Take by mouth.    . telmisartan (MICARDIS) 40 MG tablet TAKE ONE TABLET BY MOUTH DAILY 30 tablet 11   No current facility-administered medications on file prior to visit.   Allergies  Allergen Reactions  . Nitrofurantoin Monohyd Macro Anaphylaxis  . Penicillins Anaphylaxis  . Tuna [Fish Allergy] Anaphylaxis  . Clarithromycin Other (See Comments)    Face turns red.  . Crestor [Rosuvastatin]   . Iodinated Diagnostic Agents     Prev CT reports state anaphylaxis with IV contrast, pt also states severe reaction with contrast  . Levofloxacin Other (See Comments)    Face turned red.  . Lipitor [Atorvastatin Calcium] Other (See Comments)    Muscle pain  . Percocet [Oxycodone-Acetaminophen] Swelling  . Sulfa Antibiotics Swelling   Review of Systems  Respiratory: Positive for cough.   Musculoskeletal:  Positive for arthralgias.      Objective:   Physical Exam CONSTITUTIONAL: Well developed/well nourished HEAD: Normocephalic/atraumatic EYES: EOMI/PERRL ENMT: Mucous membranes moist, TMs normal, nose normal NECK: supple no meningeal signs SPINE/BACK:entire spine nontender CV: S1/S2 noted, no murmurs/rubs/gallops noted LUNGS: Lungs are clear to auscultation bilaterally, no apparent distress, deep productive sounding cough ABDOMEN: soft, nontender, no rebound or guarding, bowel sounds noted throughout abdomen GU: no cva tenderness NEURO: Pt is awake/alert/appropriate, moves all extremitiesx4.   No facial droop.   EXTREMITIES: pulses normal/equal, full ROM LYMPH: mild prominence of R posterior cervical node SKIN: warm, color normal PSYCH: no abnormalities of mood noted, alert and oriented to situation    Results for orders placed or performed in visit on 10/13/14  POCT glucose (manual entry)  Result Value Ref Range   POC Glucose 120 (A) 70 - 99 mg/dl  POCT glycosylated hemoglobin (Hb A1C)  Result Value Ref Range   Hemoglobin A1C 6.5    Assessment & Plan:  Sugar is great. She seems to be starting with an episode of bronchitis. She was given a prescription for clindamycin to fill if she were to have worsening of her situation. She has responded well to this antibiotic in the past.I personally performed the services described in this documentation, which was scribed in my presence. The recorded information has been reviewed and is accurate.

## 2014-10-15 DIAGNOSIS — M546 Pain in thoracic spine: Secondary | ICD-10-CM | POA: Diagnosis not present

## 2014-10-15 DIAGNOSIS — M5134 Other intervertebral disc degeneration, thoracic region: Secondary | ICD-10-CM | POA: Diagnosis not present

## 2014-10-15 DIAGNOSIS — M47814 Spondylosis without myelopathy or radiculopathy, thoracic region: Secondary | ICD-10-CM | POA: Diagnosis not present

## 2014-10-15 DIAGNOSIS — M9902 Segmental and somatic dysfunction of thoracic region: Secondary | ICD-10-CM | POA: Diagnosis not present

## 2014-10-20 ENCOUNTER — Other Ambulatory Visit: Payer: Self-pay | Admitting: Emergency Medicine

## 2014-10-20 NOTE — Telephone Encounter (Signed)
Dr Everlene Farrier, please see notes under 09/2014 lipid results. You wanted to know if pt takes her statin QD and she reported QOD d/t leg myalgia. Do you want pt to remain on the same medication?

## 2014-10-22 DIAGNOSIS — M546 Pain in thoracic spine: Secondary | ICD-10-CM | POA: Diagnosis not present

## 2014-10-22 DIAGNOSIS — M9902 Segmental and somatic dysfunction of thoracic region: Secondary | ICD-10-CM | POA: Diagnosis not present

## 2014-10-22 DIAGNOSIS — M5134 Other intervertebral disc degeneration, thoracic region: Secondary | ICD-10-CM | POA: Diagnosis not present

## 2014-10-22 DIAGNOSIS — M47814 Spondylosis without myelopathy or radiculopathy, thoracic region: Secondary | ICD-10-CM | POA: Diagnosis not present

## 2014-10-26 DIAGNOSIS — E782 Mixed hyperlipidemia: Secondary | ICD-10-CM | POA: Diagnosis not present

## 2014-10-26 DIAGNOSIS — E559 Vitamin D deficiency, unspecified: Secondary | ICD-10-CM | POA: Diagnosis not present

## 2014-10-27 DIAGNOSIS — M546 Pain in thoracic spine: Secondary | ICD-10-CM | POA: Diagnosis not present

## 2014-10-27 DIAGNOSIS — M9902 Segmental and somatic dysfunction of thoracic region: Secondary | ICD-10-CM | POA: Diagnosis not present

## 2014-10-27 DIAGNOSIS — M5134 Other intervertebral disc degeneration, thoracic region: Secondary | ICD-10-CM | POA: Diagnosis not present

## 2014-10-27 DIAGNOSIS — M47814 Spondylosis without myelopathy or radiculopathy, thoracic region: Secondary | ICD-10-CM | POA: Diagnosis not present

## 2014-10-30 DIAGNOSIS — Z789 Other specified health status: Secondary | ICD-10-CM | POA: Diagnosis not present

## 2014-10-30 DIAGNOSIS — I2510 Atherosclerotic heart disease of native coronary artery without angina pectoris: Secondary | ICD-10-CM | POA: Diagnosis not present

## 2014-10-30 DIAGNOSIS — E782 Mixed hyperlipidemia: Secondary | ICD-10-CM | POA: Diagnosis not present

## 2014-10-30 DIAGNOSIS — E119 Type 2 diabetes mellitus without complications: Secondary | ICD-10-CM | POA: Diagnosis not present

## 2015-01-11 ENCOUNTER — Other Ambulatory Visit: Payer: Self-pay

## 2015-01-12 DIAGNOSIS — M546 Pain in thoracic spine: Secondary | ICD-10-CM | POA: Diagnosis not present

## 2015-01-12 DIAGNOSIS — M5134 Other intervertebral disc degeneration, thoracic region: Secondary | ICD-10-CM | POA: Diagnosis not present

## 2015-01-12 DIAGNOSIS — M9902 Segmental and somatic dysfunction of thoracic region: Secondary | ICD-10-CM | POA: Diagnosis not present

## 2015-01-12 DIAGNOSIS — M47814 Spondylosis without myelopathy or radiculopathy, thoracic region: Secondary | ICD-10-CM | POA: Diagnosis not present

## 2015-02-16 ENCOUNTER — Encounter: Payer: Self-pay | Admitting: Emergency Medicine

## 2015-02-16 ENCOUNTER — Ambulatory Visit (INDEPENDENT_AMBULATORY_CARE_PROVIDER_SITE_OTHER): Payer: Medicare Other

## 2015-02-16 ENCOUNTER — Ambulatory Visit (INDEPENDENT_AMBULATORY_CARE_PROVIDER_SITE_OTHER): Payer: Medicare Other | Admitting: Emergency Medicine

## 2015-02-16 VITALS — BP 134/78 | HR 66 | Temp 97.8°F | Resp 16 | Wt 164.4 lb

## 2015-02-16 DIAGNOSIS — Z720 Tobacco use: Secondary | ICD-10-CM

## 2015-02-16 DIAGNOSIS — E119 Type 2 diabetes mellitus without complications: Secondary | ICD-10-CM

## 2015-02-16 DIAGNOSIS — R590 Localized enlarged lymph nodes: Secondary | ICD-10-CM

## 2015-02-16 DIAGNOSIS — R062 Wheezing: Secondary | ICD-10-CM

## 2015-02-16 DIAGNOSIS — F17210 Nicotine dependence, cigarettes, uncomplicated: Secondary | ICD-10-CM

## 2015-02-16 DIAGNOSIS — J301 Allergic rhinitis due to pollen: Secondary | ICD-10-CM

## 2015-02-16 DIAGNOSIS — Z658 Other specified problems related to psychosocial circumstances: Secondary | ICD-10-CM

## 2015-02-16 DIAGNOSIS — J0130 Acute sphenoidal sinusitis, unspecified: Secondary | ICD-10-CM

## 2015-02-16 DIAGNOSIS — F439 Reaction to severe stress, unspecified: Secondary | ICD-10-CM

## 2015-02-16 LAB — POCT GLYCOSYLATED HEMOGLOBIN (HGB A1C): HEMOGLOBIN A1C: 6.5

## 2015-02-16 LAB — GLUCOSE, POCT (MANUAL RESULT ENTRY): POC GLUCOSE: 104 mg/dL — AB (ref 70–99)

## 2015-02-16 MED ORDER — FLUTICASONE PROPIONATE 50 MCG/ACT NA SUSP: NASAL | Status: DC

## 2015-02-16 MED ORDER — ALPRAZOLAM 0.25 MG PO TABS: ORAL_TABLET | ORAL | Status: DC

## 2015-02-16 NOTE — Progress Notes (Signed)
This chart was scribed for Arlyss Queen, MD by Marti Sleigh, Medical Scribe. This patient was seen in Room 22 and the patient's care was started at 1:37 PM.  Chief Complaint:  Chief Complaint  Patient presents with  . Diabetes    HPI: Alison Friedman is a 69 y.o. female who reports to [facility] today reporting for diabetes follow up. Pt states her pravastatin medication gives her muscle aches. Pt states she is having constant bilateral ankle pain. Pt is not taking her BP medication because it makes her feel fatigued weak. Pt states she smokes intermittently.   Pt states her daughter is currently in chemotherapy and radiation treatment.    Past Medical History  Diagnosis Date  . Allergy   . Diabetes mellitus without complication    Past Surgical History  Procedure Laterality Date  . Breast surgery    . Appendectomy    . Tubal ligation     History   Social History  . Marital Status: Married    Spouse Name: N/A  . Number of Children: N/A  . Years of Education: N/A   Social History Main Topics  . Smoking status: Former Research scientist (life sciences)  . Smokeless tobacco: Not on file  . Alcohol Use: No  . Drug Use: No  . Sexual Activity: Yes    Birth Control/ Protection: None   Other Topics Concern  . None   Social History Narrative   No family history on file. Allergies  Allergen Reactions  . Nitrofurantoin Monohyd Macro Anaphylaxis  . Penicillins Anaphylaxis  . Tuna [Fish Allergy] Anaphylaxis  . Clarithromycin Other (See Comments)    Face turns red.  . Crestor [Rosuvastatin]   . Iodinated Diagnostic Agents     Prev CT reports state anaphylaxis with IV contrast, pt also states severe reaction with contrast  . Levofloxacin Other (See Comments)    Face turned red.  . Lipitor [Atorvastatin Calcium] Other (See Comments)    Muscle pain  . Percocet [Oxycodone-Acetaminophen] Swelling  . Sulfa Antibiotics Swelling   Prior to Admission medications   Medication Sig Start Date End Date  Taking? Authorizing Provider  albuterol (VENTOLIN HFA) 108 (90 BASE) MCG/ACT inhaler Inhale 2 puffs into the lungs every 4 (four) hours as needed for wheezing or shortness of breath. 01/01/14  Yes [name], MD  ALPRAZolam Duanne Moron) 0.25 MG tablet 1/2-1 tablet as needed for stress 10/13/14  Yes [name], MD  budesonide-formoterol [facility]) 160-4.5 MCG/ACT inhaler Inhale 2 puffs into the lungs 2 (two) times daily. 01/01/14  Yes [name], MD  clindamycin (CLEOCIN) 300 MG capsule Take 1 capsule (300 mg total) by mouth 3 (three) times daily. 10/13/14  Yes [name], MD  dicyclomine (BENTYL) 20 MG tablet TAKE ONE TABLET BY MOUTH EVERY 6 HOURS 07/14/13  Yes [name], MD  EPINEPHrine 0.3 mg/0.3 mL IJ SOAJ injection Inject 0.3 mLs (0.3 mg total) into the muscle once. 01/13/14  Yes [name], MD  fluticasone (FLONASE) 50 MCG/ACT nasal spray USE TWO SPRAY IN EACH NOSTRIL EVERY DAY 01/10/13  Yes [name], MD  metFORMIN (GLUCOPHAGE-XR) 500 MG 24 hr tablet TAKE TWO TABLETS BY MOUTH TWICE DAILY 05/15/14  Yes [name], MD  mometasone-formoterol (DULERA) 200-5 MCG/ACT AERO Inhale 2 puffs into the lungs 2 (two) times daily. 01/19/14  Yes [name], MD  Pitavastatin Calcium (LIVALO) 2 MG TABS Take by mouth.   Yes Historical Provider, MD  pravastatin (PRAVACHOL)  40 MG tablet TAKE ONE TABLET BY MOUTH ONCE DAILY 10/20/14  Yes [name], MD  telmisartan (MICARDIS) 40 MG tablet TAKE ONE TABLET BY MOUTH DAILY Patient not taking: Reported on 02/16/2015 07/01/13   [name], MD     ROS: The patient denies fevers, chills, night sweats, unintentional weight loss, chest pain, palpitations, dyspnea on exertion, nausea, vomiting, abdominal pain, dysuria, hematuria, melena, numbness, weakness, or tingling. Bilateral ankle pain. Mild persistent wheezes.  All other systems have been reviewed and were otherwise negative with the exception of those mentioned in the HPI and as above.    PHYSICAL  EXAM: Filed Vitals:   02/16/15 1330  BP: 134/78  Pulse: 66  Temp: 97.8 F (36.6 C)  Resp: 16   Body mass index is 29.13 kg/(m^2).   General: Alert, no acute distress HEENT:  Normocephalic, atraumatic, oropharynx patent. TM's somewhat dull but no fluid behind the drums. [facility]: [facility] Cardiovascular:  Regular rate and rhythm, no rubs murmurs or gallops.  No Carotid bruits, radial pulse intact. No pedal edema.  Respiratory: Clear to auscultation bilaterally. Chest clear, expiratory wheezes on forced expiration.  No cyanosis, no use of accessory musculature Abdominal: No organomegaly, abdomen is soft and non-tender, positive bowel sounds.  No masses. Musculoskeletal: Gait intact. Ankle exam normal, no redness or swelling, pulses 2+ in ankles. Skin: No rashes. Neck: Neck supple. There is a 1cm right cervical node. Neurologic: Facial musculature symmetric. Psychiatric: Patient acts appropriately throughout our interaction. Lymphatic: No cervical or submandibular lymphadenopathy Genitourinary/Anorectal: No acute findings    LABS: Results for orders placed or performed in visit on 10/13/14  Lipid panel  Result Value Ref Range   Cholesterol 214 (H) 0 - 200 mg/dL   Triglycerides 175 (H) <150 mg/dL   HDL 41 (L) >=46 mg/dL   Total CHOL/HDL Ratio 5.2 Ratio   VLDL 35 0 - 40 mg/dL   LDL Cholesterol 138 (H) 0 - 99 mg/dL  COMPLETE METABOLIC PANEL WITH GFR  Result Value Ref Range   Sodium 136 135 - 145 mEq/L   Potassium 4.5 3.5 - 5.3 mEq/L   Chloride 102 96 - 112 mEq/L   CO2 24 19 - 32 mEq/L   Glucose, Bld 121 (H) 70 - 99 mg/dL   BUN 20 6 - 23 mg/dL   Creat 0.91 0.50 - 1.10 mg/dL   Total Bilirubin 0.8 0.2 - 1.2 mg/dL   Alkaline Phosphatase 89 39 - 117 U/L   AST 17 0 - 37 U/L   ALT 13 0 - 35 U/L   Total Protein 7.2 6.0 - 8.3 g/dL   Albumin 4.3 3.5 - 5.2 g/dL   Calcium 10.1 8.4 - 10.5 mg/dL   GFR, Est African American 75 mL/Friedman   GFR, Est Non African American 65 mL/Friedman  POCT  glucose (manual entry)  Result Value Ref Range   POC Glucose 120 (A) 70 - 99 mg/dl  POCT glycosylated hemoglobin (Hb A1C)  Result Value Ref Range   Hemoglobin A1C 6.5    Results for orders placed or performed in visit on 02/16/15  POCT glucose (manual entry)  Result Value Ref Range   POC Glucose 104 (A) 70 - 99 mg/dl  POCT glycosylated hemoglobin (Hb A1C)  Result Value Ref Range   Hemoglobin A1C 6.5    her A1c is stable at 6.5.    EKG/XRAY:   Primary read interpreted by Dr. Everlene Farrier at [facility]. There appears to be some slight scarring in the  lingular area no definite masses are seen. No acute abnormalities noted.   ASSESSMENT/PLAN: Diabetes is stable.Her hemoglobin A1c is stable at 6.5. She does have a node present right-sided neck which needs follow-up CT. Try and get her approved for CT of the chest low dose as screening for lung cancer. She was encouraged to quit smoking completely. No change in diabetes medication. She will try and take as much of her cholesterol medication as she can tolerate. No other changes at the present time.I personally performed the services described in this documentation, which was scribed in my presence. The recorded information has been reviewed and is accurate.  Nena Jordan, MD   Gross sideeffects, risk and benefits, and alternatives of medications d/w patient. Patient is aware that all medications have potential sideeffects and we are unable to predict every sideeffect or drug-drug interaction that may occur.  Arlyss Queen MD 02/16/2015 1:36 PM

## 2015-02-19 ENCOUNTER — Telehealth: Payer: Self-pay | Admitting: Emergency Medicine

## 2015-02-19 NOTE — Addendum Note (Signed)
Addended by: Arlyss Queen A on: 02/19/2015 09:04 AM   Modules accepted: Orders

## 2015-02-19 NOTE — Telephone Encounter (Signed)
I called and spoke with husband and his wife. I told her again she absolutely cannot have any contrast. Patient is aware to not let him start an IV. We are all in agreement she cannot have an CT with contrast

## 2015-02-23 DIAGNOSIS — M546 Pain in thoracic spine: Secondary | ICD-10-CM | POA: Diagnosis not present

## 2015-02-23 DIAGNOSIS — M5134 Other intervertebral disc degeneration, thoracic region: Secondary | ICD-10-CM | POA: Diagnosis not present

## 2015-02-23 DIAGNOSIS — M47814 Spondylosis without myelopathy or radiculopathy, thoracic region: Secondary | ICD-10-CM | POA: Diagnosis not present

## 2015-02-23 DIAGNOSIS — M9902 Segmental and somatic dysfunction of thoracic region: Secondary | ICD-10-CM | POA: Diagnosis not present

## 2015-03-08 ENCOUNTER — Other Ambulatory Visit: Payer: Self-pay | Admitting: Emergency Medicine

## 2015-03-11 ENCOUNTER — Other Ambulatory Visit: Payer: Self-pay | Admitting: Emergency Medicine

## 2015-03-11 DIAGNOSIS — M546 Pain in thoracic spine: Secondary | ICD-10-CM | POA: Diagnosis not present

## 2015-03-11 DIAGNOSIS — M5134 Other intervertebral disc degeneration, thoracic region: Secondary | ICD-10-CM | POA: Diagnosis not present

## 2015-03-11 DIAGNOSIS — M9902 Segmental and somatic dysfunction of thoracic region: Secondary | ICD-10-CM | POA: Diagnosis not present

## 2015-03-11 DIAGNOSIS — M47814 Spondylosis without myelopathy or radiculopathy, thoracic region: Secondary | ICD-10-CM | POA: Diagnosis not present

## 2015-03-11 NOTE — Telephone Encounter (Signed)
Pt called and stated Dr. Everlene Farrier wanted her to go back on this medication to protect her heart since she has diabetes. I refilled Rx for pt.

## 2015-03-18 DIAGNOSIS — M5134 Other intervertebral disc degeneration, thoracic region: Secondary | ICD-10-CM | POA: Diagnosis not present

## 2015-03-18 DIAGNOSIS — M47814 Spondylosis without myelopathy or radiculopathy, thoracic region: Secondary | ICD-10-CM | POA: Diagnosis not present

## 2015-03-18 DIAGNOSIS — M546 Pain in thoracic spine: Secondary | ICD-10-CM | POA: Diagnosis not present

## 2015-03-18 DIAGNOSIS — M9902 Segmental and somatic dysfunction of thoracic region: Secondary | ICD-10-CM | POA: Diagnosis not present

## 2015-03-30 DIAGNOSIS — M546 Pain in thoracic spine: Secondary | ICD-10-CM | POA: Diagnosis not present

## 2015-03-30 DIAGNOSIS — M47814 Spondylosis without myelopathy or radiculopathy, thoracic region: Secondary | ICD-10-CM | POA: Diagnosis not present

## 2015-03-30 DIAGNOSIS — M5134 Other intervertebral disc degeneration, thoracic region: Secondary | ICD-10-CM | POA: Diagnosis not present

## 2015-03-30 DIAGNOSIS — M9902 Segmental and somatic dysfunction of thoracic region: Secondary | ICD-10-CM | POA: Diagnosis not present

## 2015-04-02 DIAGNOSIS — H2513 Age-related nuclear cataract, bilateral: Secondary | ICD-10-CM | POA: Diagnosis not present

## 2015-04-02 DIAGNOSIS — E119 Type 2 diabetes mellitus without complications: Secondary | ICD-10-CM | POA: Diagnosis not present

## 2015-04-02 LAB — HM DIABETES EYE EXAM

## 2015-04-07 ENCOUNTER — Other Ambulatory Visit: Payer: Self-pay | Admitting: Emergency Medicine

## 2015-04-08 NOTE — Telephone Encounter (Signed)
This med was on med list at Aug visit but hasn't been discussed recently. OK to RF?

## 2015-04-20 ENCOUNTER — Encounter: Payer: Self-pay | Admitting: Emergency Medicine

## 2015-04-24 ENCOUNTER — Other Ambulatory Visit: Payer: Self-pay | Admitting: Emergency Medicine

## 2015-04-26 ENCOUNTER — Ambulatory Visit (INDEPENDENT_AMBULATORY_CARE_PROVIDER_SITE_OTHER): Payer: Medicare Other | Admitting: Emergency Medicine

## 2015-04-26 VITALS — BP 128/82 | HR 65 | Temp 98.4°F | Resp 17 | Ht 62.5 in | Wt 166.0 lb

## 2015-04-26 DIAGNOSIS — R599 Enlarged lymph nodes, unspecified: Secondary | ICD-10-CM

## 2015-04-26 DIAGNOSIS — Z720 Tobacco use: Secondary | ICD-10-CM | POA: Diagnosis not present

## 2015-04-26 DIAGNOSIS — R591 Generalized enlarged lymph nodes: Secondary | ICD-10-CM

## 2015-04-26 DIAGNOSIS — R062 Wheezing: Secondary | ICD-10-CM

## 2015-04-26 DIAGNOSIS — J209 Acute bronchitis, unspecified: Secondary | ICD-10-CM | POA: Diagnosis not present

## 2015-04-26 DIAGNOSIS — F17210 Nicotine dependence, cigarettes, uncomplicated: Secondary | ICD-10-CM

## 2015-04-26 MED ORDER — ALBUTEROL SULFATE HFA 108 (90 BASE) MCG/ACT IN AERS: INHALATION_SPRAY | RESPIRATORY_TRACT | Status: DC

## 2015-04-26 MED ORDER — ALBUTEROL SULFATE (2.5 MG/3ML) 0.083% IN NEBU
2.5000 mg | INHALATION_SOLUTION | Freq: Once | RESPIRATORY_TRACT | Status: AC
  Administered 2015-04-26: 2.5 mg via RESPIRATORY_TRACT

## 2015-04-26 MED ORDER — IPRATROPIUM BROMIDE 0.02 % IN SOLN
0.5000 mg | Freq: Once | RESPIRATORY_TRACT | Status: AC
  Administered 2015-04-26: 0.5 mg via RESPIRATORY_TRACT

## 2015-04-26 MED ORDER — CLINDAMYCIN HCL 300 MG PO CAPS: 300.0000 mg | ORAL_CAPSULE | Freq: Three times a day (TID) | ORAL | Status: DC

## 2015-04-26 NOTE — Progress Notes (Addendum)
Subjective:  This chart was scribed for Arlyss Queen MD, by [name], at [facility].  This patient was seen in room 8 and the patient's care was started at 1:56 PM.   Chief Complaint  Patient presents with  . Shortness of Breath  . Laryngitis  . Sore Throat  . Nasal Congestion     Patient ID: Alison Friedman, female    DOB: 12/30/1945, 69 y.o.   MRN: [id number]  HPI  HPI Comments: Alison Friedman is a 69 y.o. female with a history of asthma and diabetes as well as recurrent sinus infections who presents to the [facility] complaining of a productive cough onset today. She has associated symptoms a mild sore throat (which she states is getting better) and nasal congestion which has been going on for the past four days.  She is also complaining of difficulty breathing and states that she needs her inhaler.  Patient states that many people at her living community had the flu and thinks that's where she may have gotten it.  She has no other questions or concerns today.     Patient Active Problem List   Diagnosis Date Noted  . Enlargement of lymph nodes 01/01/2014  . Cervical disc disease 05/21/2012  . Recurrent sinusitis 05/21/2012  . Diabetes mellitus ([place]) 10/10/2011  . Allergic rhinitis 10/10/2011  . RAD (reactive airway disease) 10/10/2011   Past Medical History  Diagnosis Date  . Allergy   . Diabetes mellitus without complication [facility])    Past Surgical History  Procedure Laterality Date  . Breast surgery    . Appendectomy    . Tubal ligation     Allergies  Allergen Reactions  . Nitrofurantoin Monohyd Macro Anaphylaxis  . Penicillins Anaphylaxis  . Tuna [Fish Allergy] Anaphylaxis  . Clarithromycin Other (See Comments)    Face turns red.  . Crestor [Rosuvastatin]   . Iodinated Diagnostic Agents     Prev CT reports state anaphylaxis with IV contrast, pt also states severe reaction with contrast  . Levofloxacin Other (See  Comments)    Face turned red.  . Lipitor [Atorvastatin Calcium] Other (See Comments)    Muscle pain  . Percocet [Oxycodone-Acetaminophen] Swelling  . Sulfa Antibiotics Swelling   Prior to Admission medications   Medication Sig Start Date End Date Taking? Authorizing Provider  albuterol (VENTOLIN HFA) 108 (90 BASE) MCG/ACT inhaler INHALE TWO PUFFS BY MOUTH EVERY 4 HOURS AS NEEDED FOR WHEEZING OR SHORTNESS OF BREATH 04/26/15  Yes Chelle Jeffery, PA-C  ALPRAZolam (XANAX) 0.25 MG tablet 1/2-1 tablet as needed for stress 02/16/15  Yes [name], MD  dicyclomine (BENTYL) 20 MG tablet TAKE ONE TABLET BY MOUTH EVERY 6 HOURS 04/08/15  Yes [name], MD  fluticasone (FLONASE) 50 MCG/ACT nasal spray USE TWO SPRAY IN EACH NOSTRIL EVERY DAY 02/16/15  Yes [name], MD  metFORMIN (GLUCOPHAGE-XR) 500 MG 24 hr tablet TAKE TWO TABLETS BY MOUTH TWICE DAILY 03/12/15  Yes [name], MD  pravastatin (PRAVACHOL) 40 MG tablet TAKE ONE TABLET BY MOUTH ONCE DAILY 10/20/14  Yes [name], MD  telmisartan (MICARDIS) 40 MG tablet TAKE ONE TABLET BY MOUTH DAILY 03/11/15  Yes [name], MD  budesonide-formoterol (SYMBICORT) 160-4.5 MCG/ACT inhaler Inhale 2 puffs into the lungs 2 (two) times daily. Patient not taking: Reported on 04/26/2015 01/01/14   [name], MD  clindamycin (CLEOCIN) 300 MG capsule Take 1 capsule (  300 mg total) by mouth 3 (three) times daily. Patient not taking: Reported on 04/26/2015 10/13/14   [name], MD  EPINEPHrine 0.3 mg/0.3 mL IJ SOAJ injection Inject 0.3 mLs (0.3 mg total) into the muscle once. Patient not taking: Reported on 04/26/2015 01/13/14   [name], MD  mometasone-formoterol (DULERA) 200-5 MCG/ACT AERO Inhale 2 puffs into the lungs 2 (two) times daily. Patient not taking: Reported on 04/26/2015 01/19/14   [name], MD   Social History   Social History  . Marital Status: Married    Spouse Name: N/A  . Number of Children: N/A  . Years of Education: N/A    Occupational History  . Not on file.   Social History Main Topics  . Smoking status: Former Smoker    Quit date: 01/24/2015  . Smokeless tobacco: Not on file  . Alcohol Use: No  . Drug Use: No  . Sexual Activity: Yes    Birth Control/ Protection: None   Other Topics Concern  . Not on file   Social History Narrative    Review of Systems  Constitutional: Negative for fever and chills.  HENT: Positive for congestion and sore throat.   Eyes: Negative for pain, discharge, redness and itching.  Respiratory: Positive for cough and shortness of breath.        Objective:   Physical Exam  Filed Vitals:   04/26/15 1344  BP: 128/82  Pulse: 65  Temp: 98.4 F (36.9 C)  TempSrc: Oral  Resp: 17  Height: 5' 2.5" (1.588 m)  Weight: 166 lb (75.297 kg)  SpO2: 97%     CONSTITUTIONAL: Well developed/well nourished HEAD: Normocephalic/atraumatic EYES: EOMI/PERRL ENMT: Mucous membranes moist, She has nasal congestion NECK: supple no meningeal signs there is a palpable no lower right posterior cervical chain SPINE/BACK:entire spine nontender CV: S1/S2 noted, no murmurs/rubs/gallops noted LUNGS: bilateral end expiratory wheezes with a deep cough.  ABDOMEN: soft, nontender, no rebound or guarding, bowel sounds noted throughout abdomen GU:no cva tenderness NEURO: Pt is awake/alert/appropriate, moves all extremitiesx4.  No facial droop.   EXTREMITIES: pulses normal/equal, full ROM SKIN: warm, color normal PSYCH: no abnormalities of mood noted, alert and oriented to situation Meds ordered this encounter  Medications  . albuterol (PROVENTIL) (2.5 MG/3ML) 0.083% nebulizer solution 2.5 mg    Sig:   . ipratropium (ATROVENT) nebulizer solution 0.5 mg    Sig:   . albuterol (VENTOLIN HFA) 108 (90 BASE) MCG/ACT inhaler    Sig: INHALE TWO PUFFS BY MOUTH EVERY 4 HOURS AS NEEDED FOR WHEEZING OR SHORTNESS OF BREATH    Dispense:  18 each    Refill:  3  . clindamycin (CLEOCIN) 300 MG  capsule    Sig: Take 1 capsule (300 mg total) by mouth 3 (three) times daily.    Dispense:  30 capsule    Refill:  0       Assessment & Plan:  We'll treat with regular use of her inhalers. She will force fluids. She was started on clindamycin 300 mg 3 times a day.I personally performed the services described in this documentation, which was scribed in my presence. The recorded information has been reviewed and is accurate.

## 2015-05-07 ENCOUNTER — Encounter: Payer: Self-pay | Admitting: Emergency Medicine

## 2015-05-27 ENCOUNTER — Other Ambulatory Visit: Payer: Self-pay | Admitting: Acute Care

## 2015-05-27 DIAGNOSIS — F17210 Nicotine dependence, cigarettes, uncomplicated: Secondary | ICD-10-CM

## 2015-05-28 ENCOUNTER — Encounter: Payer: Medicare Other | Admitting: Acute Care

## 2015-06-01 DIAGNOSIS — E782 Mixed hyperlipidemia: Secondary | ICD-10-CM | POA: Diagnosis not present

## 2015-06-03 ENCOUNTER — Ambulatory Visit (INDEPENDENT_AMBULATORY_CARE_PROVIDER_SITE_OTHER)
Admission: RE | Admit: 2015-06-03 | Discharge: 2015-06-03 | Disposition: A | Discharge status: Home / Self Care | Payer: Medicare Other | Source: Ambulatory Visit | Attending: Acute Care | Admitting: Acute Care

## 2015-06-03 ENCOUNTER — Ambulatory Visit (INDEPENDENT_AMBULATORY_CARE_PROVIDER_SITE_OTHER): Payer: Medicare Other | Admitting: Acute Care

## 2015-06-03 ENCOUNTER — Encounter: Payer: Self-pay | Admitting: Acute Care

## 2015-06-03 DIAGNOSIS — F17210 Nicotine dependence, cigarettes, uncomplicated: Secondary | ICD-10-CM | POA: Diagnosis not present

## 2015-06-03 DIAGNOSIS — Z87891 Personal history of nicotine dependence: Secondary | ICD-10-CM | POA: Diagnosis not present

## 2015-06-03 NOTE — Progress Notes (Signed)
Shared Decision Making Visit Lung Cancer Screening Program [phone])   Eligibility:  Age 69 y.o.  Pack Years Smoking History Calculation : 35 pack years (# packs/per year x # years smoked)  Recent History of coughing up blood  no  Unexplained weight loss? no ( >Than 15 pounds within the last 6 months )  Prior History Lung / other cancer no (Diagnosis within the last 5 years already requiring surveillance chest CT Scans).  Smoking Status Former Smoker  Former Smokers: Years since quit: < 1 year  Quit Date: Aug. 2016  Visit Components:  Discussion included one or more decision making aids. yes  Discussion included risk/benefits of screening. yes  Discussion included potential follow up diagnostic testing for abnormal scans. yes  Discussion included meaning and risk of over diagnosis. yes  Discussion included meaning and risk of False Positives. yes  Discussion included meaning of total radiation exposure. yes  Counseling Included:  Importance of adherence to annual lung cancer LDCT screening. yes  Impact of comorbidities on ability to participate in the program. yes  Ability and willingness to under diagnostic treatment. yes  Smoking Cessation Counseling:  Current Smokers:   Discussed importance of smoking cessation.  NA, former smoker  Information about tobacco cessation classes and interventions provided to patient. yes  Patient provided with "ticket" for LDCT Scan. no  Symptomatic Patient. no  Counseling: NA  Diagnosis Code: Tobacco Use Z72.0  Asymptomatic Patient yes  CounselingNA  Former Smokers:   Discussed the importance of maintaining cigarette abstinence. yes  Diagnosis Code: Personal History of Nicotine Dependence. [id number]  Information about tobacco cessation classes and interventions provided to patient. Yes  Patient provided with "ticket" for LDCT Scan. yes  Written Order for Lung Cancer Screening with LDCT placed in Epic. Yes (CT  Chest Lung Cancer Screening Low Dose W/O CM) [id number] Z12.2-Screening of respiratory organs Z87.891-Personal history of nicotine dependenc  I spent 15 minutes of face to face time with Ms. Pruiett discussing the risks and benefits of lung cancer screening. We viewed a power point together that explained the above noted topics, pausing at intervals to allow for questions to be asked and answered to ensure understanding. We discussed that by quitting smoking she has taken the single most powerful action possible to decrease her risk of lung cancer. We discussed the importance of remaining smoke free,and that she can call me if she feels the need to start smoking again, and I will provide her with the tools and support to remain smoke free. I gave her my card and contact information so she can call me in the event she needs to do so. We discussed the time and location of the scan and that I will call with her results within 24-48 hours of getting the results myself. She verbalized understanding of all of the above and had no further questions upon leaving the office.I did give her a copy of the power point we reviewed as a resource if needed.   Magdalen Spatz, NP

## 2015-06-04 ENCOUNTER — Telehealth: Payer: Self-pay | Admitting: Emergency Medicine

## 2015-06-04 DIAGNOSIS — E559 Vitamin D deficiency, unspecified: Secondary | ICD-10-CM | POA: Diagnosis not present

## 2015-06-04 DIAGNOSIS — Z789 Other specified health status: Secondary | ICD-10-CM | POA: Diagnosis not present

## 2015-06-04 DIAGNOSIS — E782 Mixed hyperlipidemia: Secondary | ICD-10-CM | POA: Diagnosis not present

## 2015-06-04 DIAGNOSIS — I2510 Atherosclerotic heart disease of native coronary artery without angina pectoris: Secondary | ICD-10-CM | POA: Diagnosis not present

## 2015-06-04 NOTE — Telephone Encounter (Signed)
Call patient. Her CT scan did show calcification of the coronary arteries and the aorta. She needs to be aggressive about management of her diabetes cholesterol and blood pressure. She also needs to quit smoking. I would suggest she make an appointment to discuss these findings with Dr. Dewaine Oats heart doctor.

## 2015-06-11 ENCOUNTER — Telehealth: Payer: Self-pay

## 2015-06-11 NOTE — Telephone Encounter (Signed)
Pt called to check on missed call. Nothing documented in EPIC

## 2015-06-11 NOTE — Telephone Encounter (Signed)
Left message for pt to call back  °

## 2015-06-14 NOTE — Telephone Encounter (Signed)
Spoke with pt, she went to see Dr. Einar Gip. She went on the Crestor and couldn't move her hand. They have tried everything but she has bad side effects or the drug is not effective. FYI Dr. Everlene Farrier. She is working on the smoking.

## 2015-06-15 NOTE — Telephone Encounter (Signed)
Left message on pt voicemail

## 2015-06-15 NOTE — Telephone Encounter (Signed)
Please call patient let her know I am very proud she's tried to get off cigarettes.

## 2015-06-29 DIAGNOSIS — M9902 Segmental and somatic dysfunction of thoracic region: Secondary | ICD-10-CM | POA: Diagnosis not present

## 2015-06-29 DIAGNOSIS — M47814 Spondylosis without myelopathy or radiculopathy, thoracic region: Secondary | ICD-10-CM | POA: Diagnosis not present

## 2015-06-29 DIAGNOSIS — M546 Pain in thoracic spine: Secondary | ICD-10-CM | POA: Diagnosis not present

## 2015-06-29 DIAGNOSIS — M5134 Other intervertebral disc degeneration, thoracic region: Secondary | ICD-10-CM | POA: Diagnosis not present

## 2015-07-01 ENCOUNTER — Ambulatory Visit (INDEPENDENT_AMBULATORY_CARE_PROVIDER_SITE_OTHER): Payer: Medicare Other | Admitting: Emergency Medicine

## 2015-07-01 ENCOUNTER — Encounter: Payer: Self-pay | Admitting: Emergency Medicine

## 2015-07-01 VITALS — BP 133/76 | HR 64 | Temp 97.4°F | Resp 16 | Ht 63.0 in | Wt 167.8 lb

## 2015-07-01 DIAGNOSIS — J449 Chronic obstructive pulmonary disease, unspecified: Secondary | ICD-10-CM

## 2015-07-01 DIAGNOSIS — Z0000 Encounter for general adult medical examination without abnormal findings: Secondary | ICD-10-CM

## 2015-07-01 DIAGNOSIS — R062 Wheezing: Secondary | ICD-10-CM | POA: Diagnosis not present

## 2015-07-01 DIAGNOSIS — E785 Hyperlipidemia, unspecified: Secondary | ICD-10-CM | POA: Diagnosis not present

## 2015-07-01 DIAGNOSIS — I2510 Atherosclerotic heart disease of native coronary artery without angina pectoris: Secondary | ICD-10-CM

## 2015-07-01 DIAGNOSIS — E119 Type 2 diabetes mellitus without complications: Secondary | ICD-10-CM

## 2015-07-01 DIAGNOSIS — R1010 Upper abdominal pain, unspecified: Secondary | ICD-10-CM

## 2015-07-01 DIAGNOSIS — F17210 Nicotine dependence, cigarettes, uncomplicated: Secondary | ICD-10-CM

## 2015-07-01 DIAGNOSIS — Z720 Tobacco use: Secondary | ICD-10-CM | POA: Diagnosis not present

## 2015-07-01 DIAGNOSIS — I2584 Coronary atherosclerosis due to calcified coronary lesion: Secondary | ICD-10-CM | POA: Diagnosis not present

## 2015-07-01 DIAGNOSIS — R599 Enlarged lymph nodes, unspecified: Secondary | ICD-10-CM

## 2015-07-01 DIAGNOSIS — F17200 Nicotine dependence, unspecified, uncomplicated: Secondary | ICD-10-CM | POA: Insufficient documentation

## 2015-07-01 DIAGNOSIS — T782XXD Anaphylactic shock, unspecified, subsequent encounter: Secondary | ICD-10-CM

## 2015-07-01 DIAGNOSIS — R591 Generalized enlarged lymph nodes: Secondary | ICD-10-CM

## 2015-07-01 LAB — CBC WITH DIFFERENTIAL/PLATELET
BASOS ABS: 0 10*3/uL (ref 0.0–0.1)
Basophils Relative: 0 % (ref 0–1)
Eosinophils Absolute: 0.6 10*3/uL (ref 0.0–0.7)
Eosinophils Relative: 8 % — ABNORMAL HIGH (ref 0–5)
HEMATOCRIT: 39 % (ref 36.0–46.0)
HEMOGLOBIN: 13.7 g/dL (ref 12.0–15.0)
LYMPHS ABS: 2.6 10*3/uL (ref 0.7–4.0)
LYMPHS PCT: 37 % (ref 12–46)
MCH: 31.4 pg (ref 26.0–34.0)
MCHC: 35.1 g/dL (ref 30.0–36.0)
MCV: 89.2 fL (ref 78.0–100.0)
MPV: 9.5 fL (ref 8.6–12.4)
Monocytes Absolute: 0.8 10*3/uL (ref 0.1–1.0)
Monocytes Relative: 12 % (ref 3–12)
NEUTROS ABS: 3 10*3/uL (ref 1.7–7.7)
NEUTROS PCT: 43 % (ref 43–77)
Platelets: 371 10*3/uL (ref 150–400)
RBC: 4.37 MIL/uL (ref 3.87–5.11)
RDW: 13.3 % (ref 11.5–15.5)
WBC: 6.9 10*3/uL (ref 4.0–10.5)

## 2015-07-01 LAB — COMPLETE METABOLIC PANEL WITH GFR
ALBUMIN: 4.5 g/dL (ref 3.6–5.1)
ALK PHOS: 93 U/L (ref 33–130)
ALT: 12 U/L (ref 6–29)
AST: 14 U/L (ref 10–35)
BILIRUBIN TOTAL: 0.8 mg/dL (ref 0.2–1.2)
BUN: 22 mg/dL (ref 7–25)
CALCIUM: 9.8 mg/dL (ref 8.6–10.4)
CO2: 28 mmol/L (ref 20–31)
CREATININE: 1.03 mg/dL — AB (ref 0.50–0.99)
Chloride: 103 mmol/L (ref 98–110)
GFR, Est African American: 64 mL/min (ref >=60)
GFR, Est Non African American: 56 mL/min — ABNORMAL LOW (ref >=60)
Glucose, Bld: 127 mg/dL — ABNORMAL HIGH (ref 65–99)
POTASSIUM: 4.8 mmol/L (ref 3.5–5.3)
Sodium: 140 mmol/L (ref 135–146)
TOTAL PROTEIN: 7.2 g/dL (ref 6.1–8.1)

## 2015-07-01 LAB — LIPID PANEL
CHOLESTEROL: 181 mg/dL (ref 125–200)
HDL: 46 mg/dL (ref >=46)
LDL Cholesterol: 93 mg/dL (ref <130)
Total CHOL/HDL Ratio: 3.9 Ratio (ref <=5.0)
Triglycerides: 209 mg/dL — ABNORMAL HIGH (ref <150)
VLDL: 42 mg/dL — ABNORMAL HIGH (ref <30)

## 2015-07-01 LAB — POC MICROSCOPIC URINALYSIS (UMFC): Mucus: ABSENT

## 2015-07-01 LAB — POCT URINALYSIS DIP (MANUAL ENTRY)
BILIRUBIN UA: NEGATIVE
Bilirubin, UA: NEGATIVE
Glucose, UA: NEGATIVE
LEUKOCYTES UA: NEGATIVE
NITRITE UA: NEGATIVE
PH UA: 5
PROTEIN UA: NEGATIVE
Spec Grav, UA: 1.02
Urobilinogen, UA: 0.2

## 2015-07-01 LAB — POCT GLYCOSYLATED HEMOGLOBIN (HGB A1C): Hemoglobin A1C: 6.9

## 2015-07-01 LAB — MICROALBUMIN, URINE: MICROALB UR: 0.2 mg/dL

## 2015-07-01 LAB — GLUCOSE, POCT (MANUAL RESULT ENTRY): POC Glucose: 125 mg/dl — AB (ref 70–99)

## 2015-07-01 MED ORDER — BUDESONIDE-FORMOTEROL FUMARATE 160-4.5 MCG/ACT IN AERO: 2.0000 | INHALATION_SPRAY | Freq: Two times a day (BID) | RESPIRATORY_TRACT | Status: DC

## 2015-07-01 MED ORDER — PRAVASTATIN SODIUM 40 MG PO TABS: 40.0000 mg | ORAL_TABLET | Freq: Every day | ORAL | Status: DC

## 2015-07-01 NOTE — Progress Notes (Addendum)
Subjective:  This chart was scribed for Alison Russian, MD by Alison Friedman, at [facility].  This patient was seen in room 22 and the patient's care was started at 8:47 AM.    Patient ID: Alison Friedman, female    DOB: August 17, 1945, 69 y.o.   MRN: [id number] Chief Complaint  Patient presents with  . Annual Exam    HPI  HPI Comments: Alison Friedman is a 69 y.o. female with a history of lung disease who presents to the [facility] for an annual physical exam. Patient is a smoker who has chronic sinus infections.    She states that her sinuses are now better and no longer has a cough- states that the Clindamycin worked well for her. She would not like a flu shot today.  She will not receive the Prevnar vaccination today due to the long list of allergies that she has.  She will schedule her mammogram in the near future as she is due for one.  Patient has had a tubal ligation and goes to Dr. Ruthann Cancer (obgyn-last went 2 years ago).    Patient states that her husband has been out of town for 7 days and she is doing very well with taking care of herself.   RUQ pain: Patient is also complaining of right upper quadrant pain.  She denies any episodes of nausea/ vomiting but states that she has a significant increase in gas and belching when she eats.     Lungs: She had a lung scan and was told that she was to come in one time a year for 10 years. She is now in a smoking program.   Heart: She was told that she has plaque in her coronary arteries and would like to know what she can do to prevent it. She states that she took Crestor and does not feel like she can tolerate it- states it felt like there was a stabbing sensation in her leg.  She has no trouble taking Pravastatin.   Patient Active Problem List   Diagnosis Date Noted  . Enlargement of lymph nodes 01/01/2014  . Cervical disc disease 05/21/2012  . Recurrent sinusitis 05/21/2012  . Diabetes mellitus  ([place]) 10/10/2011  . Allergic rhinitis 10/10/2011  . RAD (reactive airway disease) 10/10/2011   Past Medical History  Diagnosis Date  . Allergy   . Diabetes mellitus without complication [facility])    Past Surgical History  Procedure Laterality Date  . Breast surgery    . Appendectomy    . Tubal ligation     Allergies  Allergen Reactions  . Nitrofurantoin Monohyd Macro Anaphylaxis  . Penicillins Anaphylaxis  . Tuna [Fish Allergy] Anaphylaxis  . Clarithromycin Other (See Comments)    Face turns red.  . Crestor [Rosuvastatin]   . Iodinated Diagnostic Agents     Prev CT reports state anaphylaxis with IV contrast, pt also states severe reaction with contrast  . Levofloxacin Other (See Comments)    Face turned red.  . Lipitor [Atorvastatin Calcium] Other (See Comments)    Muscle pain  . Percocet [Oxycodone-Acetaminophen] Swelling  . Sulfa Antibiotics Swelling   Prior to Admission medications   Medication Sig Start Date End Date Taking? Authorizing Provider  albuterol (VENTOLIN HFA) 108 (90 BASE) MCG/ACT inhaler INHALE TWO PUFFS BY MOUTH EVERY 4 HOURS AS NEEDED FOR WHEEZING OR SHORTNESS OF BREATH 04/26/15   Alison Russian, MD  ALPRAZolam Duanne Moron) 0.25 MG tablet  1/2-1 tablet as needed for stress 02/16/15   Alison Russian, MD  budesonide-formoterol [facility]) 160-4.5 MCG/ACT inhaler Inhale 2 puffs into the lungs 2 (two) times daily. Patient not taking: Reported on 04/26/2015 01/01/14   Alison Russian, MD  clindamycin (CLEOCIN) 300 MG capsule Take 1 capsule (300 mg total) by mouth 3 (three) times daily. 04/26/15   Alison Russian, MD  dicyclomine (BENTYL) 20 MG tablet TAKE ONE TABLET BY MOUTH EVERY 6 HOURS 04/08/15   Alison Russian, MD  EPINEPHrine 0.3 mg/0.3 mL IJ SOAJ injection Inject 0.3 mLs (0.3 mg total) into the muscle once. Patient not taking: Reported on 04/26/2015 01/13/14   Alison Russian, MD  fluticasone [facility]) 50 MCG/ACT nasal spray USE TWO SPRAY IN [facility] NOSTRIL EVERY DAY 02/16/15    Alison Russian, MD  metFORMIN (GLUCOPHAGE-XR) 500 MG 24 hr tablet TAKE TWO TABLETS BY MOUTH TWICE DAILY 03/12/15   Alison Russian, MD  mometasone-formoterol (DULERA) 200-5 MCG/ACT AERO Inhale 2 puffs into the lungs 2 (two) times daily. Patient not taking: Reported on 04/26/2015 01/19/14   Alison Russian, MD  pravastatin (PRAVACHOL) 40 MG tablet TAKE ONE TABLET BY MOUTH ONCE DAILY 10/20/14   Alison Russian, MD  telmisartan (MICARDIS) 40 MG tablet TAKE ONE TABLET BY MOUTH DAILY 03/11/15   Alison Russian, MD   Social History   Social History  . Marital Status: Married    Spouse Name: N/A  . Number of Children: N/A  . Years of Education: N/A   Occupational History  . Not on file.   Social History Main Topics  . Smoking status: Former Smoker -- 1.00 packs/day for 35 years    Types: Cigarettes    Quit date: 01/24/2015  . Smokeless tobacco: Not on file     Comment: Encouraged to remain smoke free.  . Alcohol Use: No  . Drug Use: No  . Sexual Activity: Yes    Birth Control/ Protection: None   Other Topics Concern  . Not on file   Social History Narrative    Review of Systems  Constitutional: Negative for fever and chills.  Eyes: Negative for pain, redness and itching.  Respiratory: Negative for choking.   Gastrointestinal: Positive for abdominal pain. Negative for nausea and vomiting.  Musculoskeletal: Negative for neck pain and neck stiffness.  Neurological: Negative for syncope and speech difficulty.       Objective:   Physical Exam Filed Vitals:   07/01/15 0840  BP: 133/76  Pulse: 64  Temp: 97.4 F (36.3 C)  TempSrc: Oral  Resp: 16  Height: 5\' 3"  (1.6 m)  Weight: 167 lb 12.8 oz (76.114 kg)  SpO2: 97%    CONSTITUTIONAL: Well developed/well nourished HEAD: Normocephalic/atraumatic EYES: EOMI/PERRL ENMT: Mucous membranes moist NECK: supple no meningeal signs, 1 cm right posterior cervical node unchanged from previous SPINE/BACK:entire spine nontender CV: S1/S2 noted, no  murmurs/rubs/gallops noted LUNGS: Lungs are clear to auscultation bilaterally, no apparent distress ABDOMEN: Mild tenderness on her right upper abdomen GU:no cva tenderness NEURO: Pt is awake/alert/appropriate, moves all extremitiesx4.  No facial droop.   EXTREMITIES: pulses moderate, full ROM, no edema.  SKIN: warm, color normal, Mild varicose veins.  PSYCH: no abnormalities of mood noted, alert and oriented to situation Breast no masses  Results for orders placed or performed in visit on 07/01/15  POCT glucose (manual entry)  Result Value Ref Range   POC Glucose 125 (A) 70 - 99 mg/dl  POCT glycosylated hemoglobin (  Hb A1C)  Result Value Ref Range   Hemoglobin A1C 6.9   POCT urinalysis dipstick  Result Value Ref Range   Color, UA yellow yellow   Clarity, UA clear clear   Glucose, UA negative negative   Bilirubin, UA negative negative   Ketones, POC UA negative negative   Spec Grav, UA 1.020    Blood, UA trace-intact (A) negative   pH, UA 5.0    Protein Ur, POC negative negative   Urobilinogen, UA 0.2    Nitrite, UA Negative Negative   Leukocytes, UA Negative Negative  POCT Microscopic Urinalysis ([facility])  Result Value Ref Range   WBC,UR,HPF,POC None None WBC/hpf   RBC,UR,HPF,POC None None RBC/hpf   Bacteria None None, Too numerous to count   Mucus Absent Absent   Epithelial Cells, UR Per Microscopy Few (A) None, Too numerous to count cells/hpf       Assessment & Plan:  Patient is doing well. She has recovered from her recent sinus infection. She is working hard on trying to quit smoking. She had her CT of the chest and is scheduled to have this done on a yearly basis for the next 10 years. She cannot tolerate Crestor and will go back to taking pravastatin. She will consider getting her Prevnar at her next office visit in 3 months. She will be observed in the office at that time. She has had the previous pneumonia 23 vaccine without difficulty. She has had some right upper  abdominal discomfort and we will schedule an ultrasound of the upper abdomen. Routine labs were done. She is currently off of Crestor and off of pravastatin and we will get a good idea what her cholesterol is doing.I personally performed the services described in this documentation, which was scribed in my presence. The recorded information has been reviewed and is accurate. Heme none A1c went from 6.5-6.9. 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 The recorded information has been reviewed and is accurate.

## 2015-07-05 ENCOUNTER — Telehealth: Payer: Self-pay

## 2015-07-05 NOTE — Telephone Encounter (Signed)
PA needed for symbicort. Got the following ins info from pharm which doesn't match ours on file: [employer] ID # [id number], ph [phone]. Completed PA on covermymeds using following info copied from prev ph message: the patient is intolerant of multiple medications .Marland Kitchen I am very concerned about the toxicity of a powdered inhalation such as Advair.. She has not responded to steroid inhalers in the past. pt has tried [facility] also in the past and symbicort is most effective for pt. Pending.

## 2015-07-07 NOTE — Telephone Encounter (Signed)
PA approved through 07/16/16. Notified pharm.  

## 2015-07-13 ENCOUNTER — Ambulatory Visit
Admission: RE | Admit: 2015-07-13 | Discharge: 2015-07-13 | Disposition: A | Discharge status: Home / Self Care | Payer: Medicare Other | Source: Ambulatory Visit | Attending: Emergency Medicine | Admitting: Emergency Medicine

## 2015-07-13 DIAGNOSIS — K824 Cholesterolosis of gallbladder: Secondary | ICD-10-CM | POA: Diagnosis not present

## 2015-07-13 DIAGNOSIS — R1010 Upper abdominal pain, unspecified: Secondary | ICD-10-CM

## 2015-08-03 ENCOUNTER — Other Ambulatory Visit: Payer: Self-pay | Admitting: Emergency Medicine

## 2015-08-03 ENCOUNTER — Telehealth: Payer: Self-pay | Admitting: Emergency Medicine

## 2015-08-03 DIAGNOSIS — J209 Acute bronchitis, unspecified: Secondary | ICD-10-CM

## 2015-08-03 MED ORDER — CLINDAMYCIN HCL 300 MG PO CAPS: 300.0000 mg | ORAL_CAPSULE | Freq: Three times a day (TID) | ORAL | Status: DC

## 2015-08-03 NOTE — Telephone Encounter (Signed)
Patient with recurrent sinus infections. Will treat with Cleocin 300  3 times a day. Prescription has already been sent.

## 2015-08-03 NOTE — Telephone Encounter (Signed)
Pt.notified

## 2015-08-20 DIAGNOSIS — K589 Irritable bowel syndrome without diarrhea: Secondary | ICD-10-CM | POA: Diagnosis not present

## 2015-08-20 DIAGNOSIS — K5900 Constipation, unspecified: Secondary | ICD-10-CM | POA: Diagnosis not present

## 2015-09-03 ENCOUNTER — Other Ambulatory Visit: Payer: Self-pay | Admitting: Emergency Medicine

## 2015-09-05 NOTE — Telephone Encounter (Signed)
Return for follow up for further refills.

## 2015-09-06 NOTE — Telephone Encounter (Signed)
Faxed and notified pt on need for f/up. She reported that she has an appt on March 30th. She had 2 RFs left but they have expired.

## 2015-09-15 ENCOUNTER — Telehealth: Payer: Self-pay

## 2015-09-15 NOTE — Telephone Encounter (Signed)
DR. Everlene Farrier patient needs an order completed for her  Bone Density through [facility]. It's her 5 year time to have this done. [facility] phoned her and told her that she needs a referral done for this.

## 2015-09-15 NOTE — Telephone Encounter (Signed)
Dr. Daub  Please see previous message 

## 2015-09-16 ENCOUNTER — Other Ambulatory Visit: Payer: Self-pay | Admitting: Emergency Medicine

## 2015-09-16 DIAGNOSIS — E2839 Other primary ovarian failure: Secondary | ICD-10-CM

## 2015-09-16 NOTE — Telephone Encounter (Signed)
Call patient and order placed.

## 2015-09-21 DIAGNOSIS — L723 Sebaceous cyst: Secondary | ICD-10-CM | POA: Diagnosis not present

## 2015-09-24 DIAGNOSIS — Z1231 Encounter for screening mammogram for malignant neoplasm of breast: Secondary | ICD-10-CM | POA: Diagnosis not present

## 2015-09-24 DIAGNOSIS — M85851 Other specified disorders of bone density and structure, right thigh: Secondary | ICD-10-CM | POA: Diagnosis not present

## 2015-09-24 DIAGNOSIS — M810 Age-related osteoporosis without current pathological fracture: Secondary | ICD-10-CM | POA: Diagnosis not present

## 2015-09-24 DIAGNOSIS — Z803 Family history of malignant neoplasm of breast: Secondary | ICD-10-CM | POA: Diagnosis not present

## 2015-09-29 NOTE — Addendum Note (Signed)
Addended by: Wyatt Haste on: 09/29/2015 01:53 PM   Modules accepted: Miquel Dunn

## 2015-10-06 ENCOUNTER — Other Ambulatory Visit: Payer: Self-pay | Admitting: Acute Care

## 2015-10-06 DIAGNOSIS — Z87891 Personal history of nicotine dependence: Secondary | ICD-10-CM

## 2015-10-12 ENCOUNTER — Telehealth: Payer: Self-pay

## 2015-10-12 NOTE — Telephone Encounter (Signed)
Patient called back and was given results.  She stated she will be willing to see an endocrinologist.  However, she stated she would be seeing Dr. Everlene Farrier on this Thursday for an appt and she will discuss this then with him.

## 2015-10-12 NOTE — Telephone Encounter (Signed)
Attempted to call patient regarding bone density scan results. Dr. Everlene Farrier said pt is in osteoporosis range. Dr. Everlene Farrier would like to know if patient is willing to see endocrinology and discuss prescription options.  Left VM for pt to call back.

## 2015-10-14 ENCOUNTER — Ambulatory Visit (INDEPENDENT_AMBULATORY_CARE_PROVIDER_SITE_OTHER): Payer: Medicare Other | Admitting: Emergency Medicine

## 2015-10-14 ENCOUNTER — Ambulatory Visit (INDEPENDENT_AMBULATORY_CARE_PROVIDER_SITE_OTHER): Payer: Medicare Other

## 2015-10-14 ENCOUNTER — Encounter: Payer: Self-pay | Admitting: Emergency Medicine

## 2015-10-14 VITALS — BP 130/80 | HR 67 | Temp 97.9°F | Resp 16 | Ht 63.0 in | Wt 174.6 lb

## 2015-10-14 DIAGNOSIS — J449 Chronic obstructive pulmonary disease, unspecified: Secondary | ICD-10-CM

## 2015-10-14 DIAGNOSIS — M810 Age-related osteoporosis without current pathological fracture: Secondary | ICD-10-CM

## 2015-10-14 DIAGNOSIS — M25562 Pain in left knee: Secondary | ICD-10-CM | POA: Diagnosis not present

## 2015-10-14 DIAGNOSIS — Z720 Tobacco use: Secondary | ICD-10-CM | POA: Diagnosis not present

## 2015-10-14 DIAGNOSIS — M8580 Other specified disorders of bone density and structure, unspecified site: Secondary | ICD-10-CM

## 2015-10-14 DIAGNOSIS — F17210 Nicotine dependence, cigarettes, uncomplicated: Secondary | ICD-10-CM

## 2015-10-14 DIAGNOSIS — M179 Osteoarthritis of knee, unspecified: Secondary | ICD-10-CM | POA: Diagnosis not present

## 2015-10-14 DIAGNOSIS — R937 Abnormal findings on diagnostic imaging of other parts of musculoskeletal system: Secondary | ICD-10-CM

## 2015-10-14 DIAGNOSIS — E119 Type 2 diabetes mellitus without complications: Secondary | ICD-10-CM | POA: Diagnosis not present

## 2015-10-14 LAB — GLUCOSE, POCT (MANUAL RESULT ENTRY): POC GLUCOSE: 149 mg/dL — AB (ref 70–99)

## 2015-10-14 LAB — POCT GLYCOSYLATED HEMOGLOBIN (HGB A1C): HEMOGLOBIN A1C: 7

## 2015-10-14 MED ORDER — ALPRAZOLAM 0.25 MG PO TABS: ORAL_TABLET | ORAL | Status: DC

## 2015-10-14 NOTE — Progress Notes (Addendum)
Patient ID: Alison Friedman, female   DOB: 04-13-1946, 70 y.o.   MRN: [id number]    By signing my name below, I, Alison Friedman, attest that this documentation has been prepared under the direction and in the presence of Alison Russian, MD Electronically Signed: Ladene Artist, ED Scribe 10/14/2015 at 9:34 AM.  Chief Complaint:  Chief Complaint  Patient presents with  . Follow-up  . Diabetes  . Depression scale during triage    score 6   HPI: [name] is a 70 y.o. female, with a h/o DM, who reports to [facility] today for a follow-up regarding DM.   Arthralgias Pt reports gradually worsening chronic bilateral knee pain and ankle pain for the past few weeks. She noticed a firm, painful mass to her posterior left lower extremity 1 month ago, worsened over the past week. Pain is exacerbated with palpation and ambulating. She recently had a bone density scan done. Pt's sister has a h/o osteopenia.   Smoking  Pt states that she stopped smoking a few months ago and has noticed significant weight gain since.   Wt Readings from Last 3 Encounters:  10/14/15 174 lb 9.6 oz (79.198 kg)  07/01/15 167 lb 12.8 oz (76.114 kg)  04/26/15 166 lb (75.297 kg)   Past Medical History  Diagnosis Date  . Allergy   . Diabetes mellitus without complication [facility])    Past Surgical History  Procedure Laterality Date  . Breast surgery    . Appendectomy    . Tubal ligation     Social History   Social History  . Marital Status: Married    Spouse Name: N/A  . Number of Children: N/A  . Years of Education: N/A   Social History Main Topics  . Smoking status: Former Smoker -- 1.00 packs/day for 35 years    Types: Cigarettes    Quit date: 01/24/2015  . Smokeless tobacco: None     Comment: Encouraged to remain smoke free.  . Alcohol Use: No  . Drug Use: No  . Sexual Activity: Yes    Birth Control/ Protection: None   Other Topics Concern  . None   Social History Narrative   No family history on  file. Allergies  Allergen Reactions  . Nitrofurantoin Monohyd Macro Anaphylaxis  . Penicillins Anaphylaxis  . Tuna [Fish Allergy] Anaphylaxis  . Clarithromycin Other (See Comments)    Face turns red.  . Crestor [Rosuvastatin]   . Iodinated Diagnostic Agents     Prev CT reports state anaphylaxis with IV contrast, pt also states severe reaction with contrast  . Levofloxacin Other (See Comments)    Face turned red.  . Lipitor [Atorvastatin Calcium] Other (See Comments)    Muscle pain  . Percocet [Oxycodone-Acetaminophen] Swelling  . Sulfa Antibiotics Swelling   Prior to Admission medications   Medication Sig Start Date End Date Taking? Authorizing Provider  albuterol (VENTOLIN HFA) 108 (90 BASE) MCG/ACT inhaler INHALE TWO PUFFS BY MOUTH EVERY 4 HOURS AS NEEDED FOR WHEEZING OR SHORTNESS OF BREATH 04/26/15   Alison Russian, MD  ALPRAZolam Duanne Moron) 0.25 MG tablet TAKE ONE-HALF TO ONE TABLET BY MOUTH AS NEEDED STRESS 09/05/15   Ezekiel Slocumb, PA-C  budesonide-formoterol (SYMBICORT) 160-4.5 MCG/ACT inhaler Inhale 2 puffs into the lungs 2 (two) times daily. 07/01/15   Alison Russian, MD  clindamycin (CLEOCIN) 300 MG capsule Take 1 capsule (300 mg total) by mouth 3 (three) times daily. 08/03/15   Alison Russian, MD  dicyclomine (BENTYL) 20 MG tablet TAKE ONE TABLET BY MOUTH EVERY 6 HOURS Patient not taking: Reported on 07/01/2015 04/08/15   Alison Russian, MD  EPINEPHrine 0.3 mg/0.3 mL IJ SOAJ injection Inject 0.3 mLs (0.3 mg total) into the muscle once. 01/13/14   Alison Russian, MD  fluticasone (FLONASE) 50 MCG/ACT nasal spray USE TWO SPRAY IN [facility] NOSTRIL EVERY DAY Patient not taking: Reported on 07/01/2015 02/16/15   Alison Russian, MD  metFORMIN (GLUCOPHAGE-XR) 500 MG 24 hr tablet TAKE TWO TABLETS BY MOUTH TWICE DAILY 03/12/15   Alison Russian, MD  mometasone-formoterol (DULERA) 200-5 MCG/ACT AERO Inhale 2 puffs into the lungs 2 (two) times daily. Patient not taking: Reported on 04/26/2015 01/19/14   Alison Russian, MD  pravastatin (PRAVACHOL) 40 MG tablet Take 1 tablet (40 mg total) by mouth daily. 07/01/15   Alison Russian, MD  telmisartan (MICARDIS) 40 MG tablet TAKE ONE TABLET BY MOUTH DAILY 03/11/15   Alison Russian, MD   ROS: The patient denies fevers, chills, night sweats, chest pain, palpitations, wheezing, dyspnea on exertion, nausea, vomiting, abdominal pain, dysuria, hematuria, melena, numbness, weakness, or tingling. +arthralgias, +weight change   All other systems have been reviewed and were otherwise negative with the exception of those mentioned in the HPI and as above.    PHYSICAL EXAM: Filed Vitals:   10/14/15 0907  BP: 130/80  Pulse: 67  Temp: 97.9 F (36.6 C)  Resp: 16   Body mass index is 30.94 kg/(m^2).  General: Alert, no acute distress HEENT:  Normocephalic, atraumatic, oropharynx patent. Eye: Alison Friedman [facility] Cardiovascular: Regular rate and rhythm, no rubs murmurs or gallops. No Carotid bruits, radial pulse intact. No pedal edema.  Respiratory: Clear to auscultation bilaterally. No wheezes, rales, or rhonchi. No cyanosis, no use of accessory musculature Abdominal: No organomegaly, abdomen is soft and non-tender, positive bowel sounds. No masses. Musculoskeletal: Egg sized mass in the popliteal area of the L knee with degenerative changes of the knee Skin: No rashes. Neurologic: Facial musculature symmetric. Psychiatric: Patient acts appropriately throughout our interaction. Lymphatic: No cervical or submandibular lymphadenopathy  LABS:  Results for orders placed or performed in visit on 10/14/15  POCT glucose (manual entry)  Result Value Ref Range   POC Glucose 149 (A) 70 - 99 mg/dl  POCT glycosylated hemoglobin (Hb A1C)  Result Value Ref Range   Hemoglobin A1C 7.0    EKG/XRAY:   Primary read interpreted by Dr. Everlene Farrier at [facility].   Dg Knee Complete 4 Views Left  10/14/2015  CLINICAL DATA:  Knee pain of unknown origin EXAM: LEFT KNEE - COMPLETE 4+ VIEW  COMPARISON:  None in PACs FINDINGS: The bones of the left knee are adequately mineralized. There is minimal beaking of the tibial spines. There is minimal narrowing of the lateral joint compartment. There is no acute fracture nor dislocation. No joint effusion is evident. 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 There is no acute bony abnormality. Electronically Signed   By: David  Martinique M.D.   On: 10/14/2015 10:21   ASSESSMENT/PLAN: Hemoglobin A1c is the same at 7. No change in her diabetes treatment. She has cut back her smoking occasionally has a cigarette but overall has pretty much quit. She has a tender firm area behind the left knee. Referral made to Dr. Susa Raring for his evaluation. MRI scheduled of the left knee. Routine films ordered today. She also had an abnormal bone density study  at [facility]. Referral made to endocrinology for their evaluation.I personally performed the services described in this documentation, which was scribed in my presence. The recorded information has been reviewed and is accurate.    Gross sideeffects, risk and benefits, and alternatives of medications d/w patient. Patient is aware that all medications have potential sideeffects and we are unable to predict every sideeffect or drug-drug interaction that may occur.  Arlyss Queen MD 10/14/2015 9:18 AM

## 2015-10-19 ENCOUNTER — Other Ambulatory Visit: Payer: Self-pay | Admitting: Emergency Medicine

## 2015-10-24 ENCOUNTER — Inpatient Hospital Stay: Admission: RE | Admit: 2015-10-24 | Payer: Medicare Other | Source: Ambulatory Visit

## 2015-10-25 DIAGNOSIS — M25562 Pain in left knee: Secondary | ICD-10-CM | POA: Diagnosis not present

## 2015-11-05 DIAGNOSIS — M25561 Pain in right knee: Secondary | ICD-10-CM | POA: Diagnosis not present

## 2015-11-11 DIAGNOSIS — M25562 Pain in left knee: Secondary | ICD-10-CM | POA: Diagnosis not present

## 2015-11-23 ENCOUNTER — Ambulatory Visit (INDEPENDENT_AMBULATORY_CARE_PROVIDER_SITE_OTHER): Payer: Medicare Other | Admitting: Endocrinology

## 2015-11-23 ENCOUNTER — Encounter: Payer: Self-pay | Admitting: Endocrinology

## 2015-11-23 VITALS — BP 128/80 | HR 67 | Temp 98.7°F | Resp 14 | Ht 63.0 in | Wt 171.0 lb

## 2015-11-23 DIAGNOSIS — M810 Age-related osteoporosis without current pathological fracture: Secondary | ICD-10-CM

## 2015-11-23 DIAGNOSIS — R5383 Other fatigue: Secondary | ICD-10-CM

## 2015-11-23 DIAGNOSIS — E119 Type 2 diabetes mellitus without complications: Secondary | ICD-10-CM | POA: Diagnosis not present

## 2015-11-23 MED ORDER — RISEDRONATE SODIUM 150 MG PO TABS: 150.0000 mg | ORAL_TABLET | ORAL | Status: DC

## 2015-11-23 NOTE — Progress Notes (Signed)
Patient ID: Alison Friedman, female   DOB: 1946-07-14, 70 y.o.   MRN: [id number]           Chief complaint: Osteoporosis   History of Present Illness:  The patient is referred here for evaluation management of osteoporosis  She has lost about 1 inch in height over the years She had periodic screening bone density measurements done and these have showed the following T-scores on the last measurement in 3/17: Femoral neck: Measurements  -2.6 and -2.5 Spine: -2.1 FRAX fracture risk at the hip is not calculated    Apparently her bone density has declined 7-10% at various sites  From prior measurements  She has no history of low trauma fracture or height loss  Age at menopause: 40-42 years.  She took hormone replacement only for a few months and has not had a hysterectomy Other risk factors: Prior history of smoking, she stopped in 2008 Positive family history of osteoporosis including her sister, mother and grandmother  Previous treatment: None Calcium supplements: None, she has intolerance to these because of constipation even if using calcium citrate or chewable calcium.  She does have some dairy products like yogurt and cheese and her diet and recently more milk. Exercise: She tries to walk as much as possible especially at her work Vitamin D supplements: none  LABS:  Vitamin D level in 2015 was 31  No results found for: VD25OH   Past Medical History  Diagnosis Date  . Allergy   . Diabetes mellitus without complication ([place])   . Pulmonary embolism ([place]) At age 47     With associated DVT    Past Surgical History  Procedure Laterality Date  . Breast surgery    . Appendectomy    . Tubal ligation      Family History  Problem Relation Age of Onset  . Osteoporosis Mother   . Osteoporosis Sister     Social History:  reports that she quit smoking about 10 months ago. Her smoking use included Cigarettes. She has a 35 pack-year smoking history. She does not have any  smokeless tobacco history on file. She reports that she does not drink alcohol or use illicit drugs.  Allergies:  Allergies  Allergen Reactions  . Nitrofurantoin Monohyd Macro Anaphylaxis  . Penicillins Anaphylaxis  . Tuna [Fish Allergy] Anaphylaxis  . Clarithromycin Other (See Comments)    Face turns red.  . Crestor [Rosuvastatin]   . Iodinated Diagnostic Agents     Prev CT reports state anaphylaxis with IV contrast, pt also states severe reaction with contrast  . Levofloxacin Other (See Comments)    Face turned red.  . Lipitor [Atorvastatin Calcium] Other (See Comments)    Muscle pain  . Percocet [Oxycodone-Acetaminophen] Swelling  . Sulfa Antibiotics Swelling      Medication List       This list is accurate as of: 11/23/15 11:59 PM.  Always use your most recent med list.               albuterol 108 (90 Base) MCG/ACT inhaler  Commonly known as:  VENTOLIN HFA  INHALE TWO PUFFS BY MOUTH EVERY 4 HOURS AS NEEDED FOR WHEEZING OR SHORTNESS OF BREATH     ALPRAZolam 0.25 MG tablet  Commonly known as:  XANAX  TAKE ONE-HALF TO ONE TABLET BY MOUTH AS NEEDED STRESS     budesonide-formoterol 160-4.5 MCG/ACT inhaler  Commonly known as:  SYMBICORT  Inhale 2 puffs into the lungs 2 (two) times daily.  dicyclomine 20 MG tablet  Commonly known as:  BENTYL  TAKE ONE TABLET BY MOUTH EVERY 6 HOURS     EPINEPHrine 0.3 mg/0.3 mL Soaj injection  Commonly known as:  EPI-PEN  Inject 0.3 mLs (0.3 mg total) into the muscle once.     fluticasone 50 MCG/ACT nasal spray  Commonly known as:  FLONASE  USE TWO SPRAY IN EACH NOSTRIL EVERY DAY     metFORMIN 500 MG 24 hr tablet  Commonly known as:  GLUCOPHAGE-XR  TAKE TWO TABLETS BY MOUTH TWICE DAILY     pravastatin 40 MG tablet  Commonly known as:  PRAVACHOL  Take 1 tablet (40 mg total) by mouth daily.     risedronate 150 MG tablet  Commonly known as:  ACTONEL  Take 1 tablet (150 mg total) by mouth every 30 (thirty) days. with water on  empty stomach, nothing by mouth or lie down for next 30 minutes.     telmisartan 40 MG tablet  Commonly known as:  MICARDIS  TAKE ONE TABLET BY MOUTH DAILY         Review of Systems  Constitutional: Negative for weight loss.  HENT: Negative for trouble swallowing.   Cardiovascular: Negative for leg swelling.  Gastrointestinal: Negative for constipation.  Endocrine: Positive for fatigue and cold intolerance.  Musculoskeletal: Positive for joint pain.       Has had some knee joint pain, going to have meniscus surgery this week  Skin: Negative for rash.       She has noticed some hair loss recently which is generalized  Neurological: Negative for numbness and tingling.     She has type 2 diabetes diagnosed in 2003.  She thinks this was after she gained a lot of weight after smoking cessation Currently on metformin only  Lab Results  Component Value Date   HGBA1C 7.0 10/14/2015   HGBA1C 6.9 07/01/2015   HGBA1C 6.5 02/16/2015   Lab Results  Component Value Date   MICROALBUR 0.2 07/01/2015   LDLCALC 93 07/01/2015   CREATININE 1.03* 07/01/2015   Previous thyroid levels:  Lab Results  Component Value Date   TSH 3.147 05/09/2011     LABS:  No visits with results within 1 Week(s) from this visit. Latest known visit with results is:  Office Visit on 10/14/2015  Component Date Value Ref Range Status  . POC Glucose 10/14/2015 149* 70 - 99 mg/dl Final  . Hemoglobin A1C 10/14/2015 7.0   Final     PHYSICAL EXAM:  BP 128/80 mmHg  Pulse 67  Temp(Src) 98.7 F (37.1 C)  Resp 14  Ht 5\' 3"  (1.6 m)  Wt 171 lb (77.565 kg)  BMI 30.30 kg/m2  SpO2 97%  GENERAL: Averagely built and nourished  No pallor, clubbing, lymphadenopathy or edema.   Skin:  no rash or pigmentation.No obvious alopecia  EYES:  Externally normal.    ENT: Oral mucosa and tongue normal.  THYROID:  Not palpable.  HEART:  Normal  S1 and S2; no murmur or click.  CHEST:  Normal shape.  Lungs:  Vescicular breath sounds heard equally.  No crepitations/ wheeze.  ABDOMEN:  No distention.  Liver and spleen not palpable.  No other mass or tenderness.  JOINTS:  Normal.  SPINE: Mild lower thoracic prominence of spines without tenderness.  Mild scoliosis mid- spine  NEUROLOGICAL: .Reflexes are normal bilaterally at biceps.   ASSESSMENT:   OSTEOPOROSIS  She has osteoporosis by T score of the right neck of femur  and her T-scores are in the osteopenic range at the other sites. 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 Risk factors are early menopause, strong family history of osteoporosis, lack of vitamin D supplementation  FATIGUE, hair loss and cold intolerance.  May have mild hypothyroidism  Diabetes: She has a BMI of 30 and an A1c of 7, currently followed by PCP with metformin treatment   PLAN:    Check basic labs including chemistry, CBC, thyroid levels, vitamin D and serum protein electrophoresis  Actonel 150 mg monthly.  Discussed how to take this  Most likely will need to start 2000 units of vitamin D3  She will try to increase calcium intake and her diet  Regular walking for weightbearing exercise  Periodic vitamin D levels to be checked  Allsion [place] 11/24/2015, 9:51 AM

## 2015-11-23 NOTE — Patient Instructions (Signed)
Vitamin d3, 2000 units daily

## 2015-11-24 ENCOUNTER — Other Ambulatory Visit (INDEPENDENT_AMBULATORY_CARE_PROVIDER_SITE_OTHER): Payer: Medicare Other

## 2015-11-24 ENCOUNTER — Encounter: Payer: Self-pay | Admitting: Endocrinology

## 2015-11-24 DIAGNOSIS — M810 Age-related osteoporosis without current pathological fracture: Secondary | ICD-10-CM

## 2015-11-24 DIAGNOSIS — R5383 Other fatigue: Secondary | ICD-10-CM | POA: Diagnosis not present

## 2015-11-24 LAB — COMPREHENSIVE METABOLIC PANEL
ALT: 10 U/L (ref 0–35)
AST: 10 U/L (ref 0–37)
Albumin: 3.9 g/dL (ref 3.5–5.2)
Alkaline Phosphatase: 76 U/L (ref 39–117)
BILIRUBIN TOTAL: 0.5 mg/dL (ref 0.2–1.2)
BUN: 25 mg/dL — ABNORMAL HIGH (ref 6–23)
CALCIUM: 9.3 mg/dL (ref 8.4–10.5)
CHLORIDE: 106 meq/L (ref 96–112)
CO2: 24 meq/L (ref 19–32)
Creatinine, Ser: 1.06 mg/dL (ref 0.40–1.20)
GFR: 54.48 mL/min — AB (ref >60.00)
GLUCOSE: 106 mg/dL — AB (ref 70–99)
POTASSIUM: 4.3 meq/L (ref 3.5–5.1)
Sodium: 138 mEq/L (ref 135–145)
Total Protein: 6.5 g/dL (ref 6.0–8.3)

## 2015-11-24 LAB — CBC WITH DIFFERENTIAL/PLATELET
BASOS ABS: 0 10*3/uL (ref 0.0–0.1)
Basophils Relative: 0.5 % (ref 0.0–3.0)
Eosinophils Absolute: 0.4 10*3/uL (ref 0.0–0.7)
Eosinophils Relative: 6.7 % — ABNORMAL HIGH (ref 0.0–5.0)
HCT: 37.8 % (ref 36.0–46.0)
Hemoglobin: 12.6 g/dL (ref 12.0–15.0)
LYMPHS ABS: 2.9 10*3/uL (ref 0.7–4.0)
Lymphocytes Relative: 43.8 % (ref 12.0–46.0)
MCHC: 33.4 g/dL (ref 30.0–36.0)
MCV: 91.6 fl (ref 78.0–100.0)
MONOS PCT: 10.2 % (ref 3.0–12.0)
Monocytes Absolute: 0.7 10*3/uL (ref 0.1–1.0)
NEUTROS ABS: 2.5 10*3/uL (ref 1.4–7.7)
NEUTROS PCT: 38.8 % — AB (ref 43.0–77.0)
PLATELETS: 336 10*3/uL (ref 150.0–400.0)
RBC: 4.13 Mil/uL (ref 3.87–5.11)
RDW: 13.3 % (ref 11.5–15.5)
WBC: 6.5 10*3/uL (ref 4.0–10.5)

## 2015-11-24 LAB — T4, FREE: FREE T4: 0.81 ng/dL (ref 0.60–1.60)

## 2015-11-24 LAB — TSH: TSH: 4.17 u[IU]/mL (ref 0.35–4.50)

## 2015-11-24 LAB — VITAMIN D 25 HYDROXY (VIT D DEFICIENCY, FRACTURES): VITD: 18.22 ng/mL — ABNORMAL LOW (ref 30.00–100.00)

## 2015-11-25 DIAGNOSIS — Y939 Activity, unspecified: Secondary | ICD-10-CM | POA: Diagnosis not present

## 2015-11-25 DIAGNOSIS — S83272A Complex tear of lateral meniscus, current injury, left knee, initial encounter: Secondary | ICD-10-CM | POA: Diagnosis not present

## 2015-11-25 DIAGNOSIS — Y929 Unspecified place or not applicable: Secondary | ICD-10-CM | POA: Diagnosis not present

## 2015-11-25 DIAGNOSIS — M94242 Chondromalacia, joints of left hand: Secondary | ICD-10-CM | POA: Diagnosis not present

## 2015-11-25 DIAGNOSIS — Y999 Unspecified external cause status: Secondary | ICD-10-CM | POA: Diagnosis not present

## 2015-11-25 DIAGNOSIS — S83242A Other tear of medial meniscus, current injury, left knee, initial encounter: Secondary | ICD-10-CM | POA: Diagnosis not present

## 2015-11-25 DIAGNOSIS — G8918 Other acute postprocedural pain: Secondary | ICD-10-CM | POA: Diagnosis not present

## 2015-11-25 DIAGNOSIS — M23322 Other meniscus derangements, posterior horn of medial meniscus, left knee: Secondary | ICD-10-CM | POA: Diagnosis not present

## 2015-11-25 DIAGNOSIS — M2242 Chondromalacia patellae, left knee: Secondary | ICD-10-CM | POA: Diagnosis not present

## 2015-11-25 DIAGNOSIS — M659 Synovitis and tenosynovitis, unspecified: Secondary | ICD-10-CM | POA: Diagnosis not present

## 2015-11-25 DIAGNOSIS — M23301 Other meniscus derangements, unspecified lateral meniscus, left knee: Secondary | ICD-10-CM | POA: Diagnosis not present

## 2015-11-30 ENCOUNTER — Other Ambulatory Visit: Payer: Self-pay | Admitting: Emergency Medicine

## 2015-11-30 NOTE — Progress Notes (Signed)
Quick Note:  Please let patient know that the vitamin D level is low, recommend OTC vitamin D3, about 4000 units daily, blood sugar was 106 above the normal level of 100 of fasting glucose, likely has mild prediabetes, start Actonel as prescribed ______

## 2015-12-07 ENCOUNTER — Encounter: Payer: Self-pay | Admitting: *Deleted

## 2015-12-07 DIAGNOSIS — Z4789 Encounter for other orthopedic aftercare: Secondary | ICD-10-CM | POA: Diagnosis not present

## 2015-12-16 DIAGNOSIS — M25562 Pain in left knee: Secondary | ICD-10-CM | POA: Diagnosis not present

## 2015-12-21 DIAGNOSIS — M25562 Pain in left knee: Secondary | ICD-10-CM | POA: Diagnosis not present

## 2015-12-23 DIAGNOSIS — M25562 Pain in left knee: Secondary | ICD-10-CM | POA: Diagnosis not present

## 2015-12-28 DIAGNOSIS — M25562 Pain in left knee: Secondary | ICD-10-CM | POA: Diagnosis not present

## 2015-12-30 DIAGNOSIS — M25562 Pain in left knee: Secondary | ICD-10-CM | POA: Diagnosis not present

## 2016-01-04 DIAGNOSIS — M25562 Pain in left knee: Secondary | ICD-10-CM | POA: Diagnosis not present

## 2016-01-06 DIAGNOSIS — M25562 Pain in left knee: Secondary | ICD-10-CM | POA: Diagnosis not present

## 2016-01-11 DIAGNOSIS — M25562 Pain in left knee: Secondary | ICD-10-CM | POA: Diagnosis not present

## 2016-01-13 DIAGNOSIS — M25562 Pain in left knee: Secondary | ICD-10-CM | POA: Diagnosis not present

## 2016-01-15 ENCOUNTER — Telehealth: Payer: Self-pay

## 2016-01-15 NOTE — Telephone Encounter (Signed)
from [facility] - advise pt not getting refills  IC pt - she stopped for a while and has been on it again x 2-3 months. Fax returned to [facility] with above info

## 2016-01-21 DIAGNOSIS — M25562 Pain in left knee: Secondary | ICD-10-CM | POA: Diagnosis not present

## 2016-01-25 DIAGNOSIS — S83232D Complex tear of medial meniscus, current injury, left knee, subsequent encounter: Secondary | ICD-10-CM | POA: Diagnosis not present

## 2016-01-25 DIAGNOSIS — Z4789 Encounter for other orthopedic aftercare: Secondary | ICD-10-CM | POA: Diagnosis not present

## 2016-01-27 DIAGNOSIS — E782 Mixed hyperlipidemia: Secondary | ICD-10-CM | POA: Diagnosis not present

## 2016-01-27 DIAGNOSIS — E559 Vitamin D deficiency, unspecified: Secondary | ICD-10-CM | POA: Diagnosis not present

## 2016-01-28 DIAGNOSIS — M25562 Pain in left knee: Secondary | ICD-10-CM | POA: Diagnosis not present

## 2016-02-01 DIAGNOSIS — Z789 Other specified health status: Secondary | ICD-10-CM | POA: Diagnosis not present

## 2016-02-01 DIAGNOSIS — Z6830 Body mass index (BMI) 30.0-30.9, adult: Secondary | ICD-10-CM | POA: Diagnosis not present

## 2016-02-01 DIAGNOSIS — M25562 Pain in left knee: Secondary | ICD-10-CM | POA: Diagnosis not present

## 2016-02-01 DIAGNOSIS — I2510 Atherosclerotic heart disease of native coronary artery without angina pectoris: Secondary | ICD-10-CM | POA: Diagnosis not present

## 2016-02-01 DIAGNOSIS — E782 Mixed hyperlipidemia: Secondary | ICD-10-CM | POA: Diagnosis not present

## 2016-02-03 DIAGNOSIS — M25562 Pain in left knee: Secondary | ICD-10-CM | POA: Diagnosis not present

## 2016-02-04 ENCOUNTER — Ambulatory Visit (INDEPENDENT_AMBULATORY_CARE_PROVIDER_SITE_OTHER): Payer: Medicare Other | Admitting: Emergency Medicine

## 2016-02-04 VITALS — BP 118/70 | HR 69 | Temp 98.3°F | Resp 18 | Ht 60.0 in | Wt 169.0 lb

## 2016-02-04 DIAGNOSIS — R300 Dysuria: Secondary | ICD-10-CM

## 2016-02-04 DIAGNOSIS — E785 Hyperlipidemia, unspecified: Secondary | ICD-10-CM | POA: Diagnosis not present

## 2016-02-04 DIAGNOSIS — E119 Type 2 diabetes mellitus without complications: Secondary | ICD-10-CM

## 2016-02-04 DIAGNOSIS — M25562 Pain in left knee: Secondary | ICD-10-CM

## 2016-02-04 LAB — POCT URINALYSIS DIP (MANUAL ENTRY)
Bilirubin, UA: NEGATIVE
Glucose, UA: NEGATIVE
Ketones, POC UA: NEGATIVE
NITRITE UA: NEGATIVE
PROTEIN UA: NEGATIVE
UROBILINOGEN UA: 0.2
pH, UA: 5

## 2016-02-04 LAB — POC MICROSCOPIC URINALYSIS (UMFC): MUCUS RE: ABSENT

## 2016-02-04 LAB — POCT GLYCOSYLATED HEMOGLOBIN (HGB A1C): HEMOGLOBIN A1C: 6.8

## 2016-02-04 MED ORDER — METFORMIN HCL ER 500 MG PO TB24: 1000.0000 mg | ORAL_TABLET | Freq: Two times a day (BID) | ORAL | Status: DC

## 2016-02-04 MED ORDER — CEPHALEXIN 500 MG PO CAPS: 500.0000 mg | ORAL_CAPSULE | Freq: Two times a day (BID) | ORAL | Status: DC

## 2016-02-04 MED ORDER — ALPRAZOLAM 0.25 MG PO TABS: ORAL_TABLET | ORAL | Status: DC

## 2016-02-04 NOTE — Addendum Note (Signed)
Addended by: Arlyss Queen A on: 02/04/2016 04:34 PM   Modules accepted: Miquel Dunn

## 2016-02-04 NOTE — Progress Notes (Addendum)
By signing my name below, I, Raven Small, attest that this documentation has been prepared under the direction and in the presence of Arlyss Queen, MD.  Electronically Signed: Thea Alken, ED Scribe. 02/04/2016. 2:33 PM.   Chief Complaint:  Chief Complaint  Patient presents with  . Follow-up    diabetes follow up   . Urinary Tract Infection    HPI: Alison Friedman is a 70 y.o. female who reports to [facility] today follow up. Pt states last time she had her sugar check was 3 months ago. Since than she's had left knee surgery.   Lab Results  Component Value Date   HGBA1C 7.0 10/14/2015    She also complains of dysuria. She reports tenderness/pressure in low abdomen/pelvis.  She is unable to take Macrobid due to multiple drug allergies.   Pt states Dr. Everitt Amber wants to try her on a new trial medication, Esperion,  for people who can't take statin and would like discus the effects of the medication. .    Past Medical History  Diagnosis Date  . Allergy   . Diabetes mellitus without complication ([place])   . Pulmonary embolism ([place]) At age 63     With associated DVT   Past Surgical History  Procedure Laterality Date  . Breast surgery    . Appendectomy    . Tubal ligation     Social History   Social History  . Marital Status: Married    Spouse Name: N/A  . Number of Children: N/A  . Years of Education: N/A   Social History Main Topics  . Smoking status: Former Smoker -- 1.00 packs/day for 35 years    Types: Cigarettes    Quit date: 01/24/2015  . Smokeless tobacco: None     Comment: Encouraged to remain smoke free.  . Alcohol Use: No  . Drug Use: No  . Sexual Activity: Yes    Birth Control/ Protection: None   Other Topics Concern  . None   Social History Narrative   Family History  Problem Relation Age of Onset  . Osteoporosis Mother   . Osteoporosis Sister    Allergies  Allergen Reactions  . Nitrofurantoin Monohyd Macro Anaphylaxis  . Penicillins Anaphylaxis  .  Tuna [Fish Allergy] Anaphylaxis  . Clarithromycin Other (See Comments)    Face turns red.  . Crestor [Rosuvastatin]   . Iodinated Diagnostic Agents     Prev CT reports state anaphylaxis with IV contrast, pt also states severe reaction with contrast  . Levofloxacin Other (See Comments)    Face turned red.  . Lipitor [Atorvastatin Calcium] Other (See Comments)    Muscle pain  . Percocet [Oxycodone-Acetaminophen] Swelling  . Sulfa Antibiotics Swelling   Prior to Admission medications   Medication Sig Start Date End Date Taking? Authorizing Provider  albuterol (VENTOLIN HFA) 108 (90 BASE) MCG/ACT inhaler INHALE TWO PUFFS BY MOUTH EVERY 4 HOURS AS NEEDED FOR WHEEZING OR SHORTNESS OF BREATH 04/26/15  Yes [name], MD  ALPRAZolam Duanne Moron) 0.25 MG tablet TAKE ONE-HALF TO ONE TABLET BY MOUTH AS NEEDED STRESS 10/14/15  Yes [name], MD  aspirin EC 81 MG tablet Take 81 mg by mouth daily.   Yes Historical Provider, MD  budesonide-formoterol (SYMBICORT) 160-4.5 MCG/ACT inhaler Inhale 2 puffs into the lungs 2 (two) times daily. 07/01/15  Yes [name], MD  dicyclomine (BENTYL) 20 MG tablet TAKE ONE TABLET BY MOUTH EVERY 6 HOURS 04/08/15  Yes [name], MD  EPINEPHrine 0.3 mg/0.3 mL IJ SOAJ injection Inject 0.3 mLs (0.3 mg total) into the muscle once. 01/13/14  Yes [name], MD  fluticasone (FLONASE) 50 MCG/ACT nasal spray USE TWO SPRAY IN EACH NOSTRIL EVERY DAY 02/16/15  Yes [name], MD  metFORMIN (GLUCOPHAGE-XR) 500 MG 24 hr tablet TAKE TWO TABLETS BY MOUTH TWICE DAILY 10/21/15  Yes [name], MD  pravastatin (PRAVACHOL) 40 MG tablet Take 1 tablet (40 mg total) by mouth daily. 07/01/15  Yes [name], MD  risedronate (ACTONEL) 150 MG tablet Take 1 tablet (150 mg total) by mouth every 30 (thirty) days. with water on empty stomach, nothing by mouth or lie down for next 30 minutes. 11/23/15  Yes Elayne Snare, MD  telmisartan (MICARDIS) 40 MG tablet TAKE ONE TABLET BY MOUTH ONCE  DAILY 11/30/15  Yes [name], MD     ROS: The patient denies fevers, chills, night sweats, unintentional weight loss, chest pain, palpitations, wheezing, dyspnea on exertion, nausea, vomiting, abdominal pain, dysuria, hematuria, melena, numbness, weakness, or tingling.   All other systems have been reviewed and were otherwise negative with the exception of those mentioned in the HPI and as above.    PHYSICAL EXAM: Filed Vitals:   02/04/16 1408  BP: 118/70  Pulse: 69  Temp: 98.3 F (36.8 C)  Resp: 18   Body mass index is 33.01 kg/(m^2).   General: Alert, no acute distress HEENT:  Normocephalic, atraumatic, oropharynx patent. Eye: Juliette Mangle [facility] Cardiovascular:  Regular rate and rhythm, no rubs murmurs or gallops.  No Carotid bruits, radial pulse intact. No pedal edema.  Respiratory: Clear to auscultation bilaterally.  No wheezes, rales, or rhonchi.  No cyanosis, no use of accessory musculature Abdominal: No organomegaly, abdomen is soft and non-tender, positive bowel sounds.  No masses. Musculoskeletal: Gait intact. No edema, tenderness Skin: No rashes. Neurologic: Facial musculature symmetric. Psychiatric: Patient acts appropriately throughout our interaction. Lymphatic: No cervical or submandibular lymphadenopathy    LABS: Results for orders placed or performed in visit on 02/04/16  POCT Microscopic Urinalysis ([facility])  Result Value Ref Range   WBC,UR,HPF,POC Moderate (A) None WBC/hpf   RBC,UR,HPF,POC None None RBC/hpf   Bacteria Moderate (A) None, Too numerous to count   Mucus Absent Absent   Epithelial Cells, UR Per Microscopy Few (A) None, Too numerous to count cells/hpf  POCT urinalysis dipstick  Result Value Ref Range   Color, UA yellow yellow   Clarity, UA clear clear   Glucose, UA negative negative   Bilirubin, UA negative negative   Ketones, POC UA negative negative   Spec Grav, UA <=1.005    Blood, UA small (A) negative   pH, UA 5.0    Protein Ur, POC  negative negative   Urobilinogen, UA 0.2    Nitrite, UA Negative Negative   Leukocytes, UA small (1+) (A) Negative  POCT glycosylated hemoglobin (Hb A1C)  Result Value Ref Range   Hemoglobin A1C 6.8    Meds ordered this encounter  Medications  . aspirin EC 81 MG tablet    Sig: Take 81 mg by mouth daily.  . cephALEXin (KEFLEX) 500 MG capsule    Sig: Take 1 capsule (500 mg total) by mouth 2 (two) times daily.    Dispense:  14 capsule    Refill:  0  . metFORMIN (GLUCOPHAGE-XR) 500 MG 24 hr tablet    Sig: Take 2 tablets (1,000 mg total) by mouth 2 (two) times daily.    Dispense:  360 tablet    Refill:  3  . ALPRAZolam (XANAX) 0.25 MG tablet    Sig: TAKE ONE-HALF TO ONE TABLET BY MOUTH AS NEEDED STRESS    Dispense:  20 tablet    Refill:  3    ASSESSMENT/PLAN: Patient doing well. Hemoglobin A1c down to 6.8. Does. Have a urinary tract infection and will be on Keflex. She has a penicillin allergy but has taken Keflex before without problems. Urine culture was done.I personally performed the services described in this documentation, which was scribed in my presence. The recorded information has been reviewed and is accurate.The chart says she had an anaphylactic reaction to penicillin. This is not true she had hives on her right arm at the age of 63.I personally performed the services described in this documentation, which was scribed in my presence. The recorded information has been reviewed and is accurate.She is going to give a try of a new cholesterol lowering drug called bempedoic acid. Gross sideeffects, risk and benefits, and alternatives of medications d/w patient. Patient is aware that all medications have potential sideeffects and we are unable to predict every sideeffect or drug-drug interaction that may occur.  Arlyss Queen MD 02/04/2016 2:33 PM

## 2016-02-04 NOTE — Patient Instructions (Signed)
     IF you received an x-ray today, you will receive an invoice from [facility]. Please contact [facility] at [phone] with questions or concerns regarding your invoice.   IF you received labwork today, you will receive an invoice from [facility]. Please contact [facility] at [phone] with questions or concerns regarding your invoice.   Our billing staff will not be able to assist you with questions regarding bills from these companies.  You will be contacted with the lab results as soon as they are available. The fastest way to get your results is to activate your My Chart account. Instructions are located on the last page of this paperwork. If you have not heard from us regarding the results in 2 weeks, please contact this office.      

## 2016-02-07 LAB — URINE CULTURE

## 2016-02-08 DIAGNOSIS — M25562 Pain in left knee: Secondary | ICD-10-CM | POA: Diagnosis not present

## 2016-02-10 DIAGNOSIS — M25562 Pain in left knee: Secondary | ICD-10-CM | POA: Diagnosis not present

## 2016-02-15 DIAGNOSIS — Z4789 Encounter for other orthopedic aftercare: Secondary | ICD-10-CM | POA: Diagnosis not present

## 2016-02-15 DIAGNOSIS — M25562 Pain in left knee: Secondary | ICD-10-CM | POA: Diagnosis not present

## 2016-02-15 DIAGNOSIS — S83232D Complex tear of medial meniscus, current injury, left knee, subsequent encounter: Secondary | ICD-10-CM | POA: Diagnosis not present

## 2016-03-16 ENCOUNTER — Telehealth: Payer: Self-pay

## 2016-03-16 NOTE — Telephone Encounter (Signed)
[employer] faxed notice (also said they mailed to pts) to advise recall on one lot of pravastatin 40 mg because one bottle was found to contain bupropion (anti-depressant) instead. Lot # affected is [id number]. Dr Everlene Farrier advised that pt is to stop taking her pravastatin until she has it checked by her pharmacist, because pt is reported to have gotten a RF during the time in question and could have gotten some from that lot #. LMOM on both Cell and Home #s to [place].

## 2016-03-21 NOTE — Telephone Encounter (Signed)
Left detailed message w/Dr Daub's inst's below (OK per HIPPA). Asked for CB w/?s.

## 2016-03-28 DIAGNOSIS — Z4789 Encounter for other orthopedic aftercare: Secondary | ICD-10-CM | POA: Diagnosis not present

## 2016-05-02 ENCOUNTER — Ambulatory Visit (INDEPENDENT_AMBULATORY_CARE_PROVIDER_SITE_OTHER): Payer: Medicare Other | Admitting: Physician Assistant

## 2016-05-02 VITALS — BP 128/80 | HR 72 | Temp 98.1°F | Resp 16 | Ht 62.0 in | Wt 168.0 lb

## 2016-05-02 DIAGNOSIS — R300 Dysuria: Secondary | ICD-10-CM | POA: Diagnosis not present

## 2016-05-02 LAB — POCT URINALYSIS DIP (MANUAL ENTRY)
BILIRUBIN UA: NEGATIVE
GLUCOSE UA: NEGATIVE
Nitrite, UA: NEGATIVE
Protein Ur, POC: NEGATIVE
RBC UA: NEGATIVE
SPEC GRAV UA: 1.02
UROBILINOGEN UA: 0.2
pH, UA: 5

## 2016-05-02 LAB — POC MICROSCOPIC URINALYSIS (UMFC): Mucus: ABSENT

## 2016-05-02 MED ORDER — CEPHALEXIN 500 MG PO CAPS: 500.0000 mg | ORAL_CAPSULE | Freq: Three times a day (TID) | ORAL | 0 refills | Status: DC

## 2016-05-02 NOTE — Patient Instructions (Signed)
     IF you received an x-ray today, you will receive an invoice from [facility]. Please contact [facility] at [phone] with questions or concerns regarding your invoice.   IF you received labwork today, you will receive an invoice from [facility]. Please contact [facility] at [phone] with questions or concerns regarding your invoice.   Our billing staff will not be able to assist you with questions regarding bills from these companies.  You will be contacted with the lab results as soon as they are available. The fastest way to get your results is to activate your My Chart account. Instructions are located on the last page of this paperwork. If you have not heard from us regarding the results in 2 weeks, please contact this office.      

## 2016-05-02 NOTE — Progress Notes (Signed)
05/02/2016 2:04 PM   DOB: November 08, 1945 / MRN: [id number]  SUBJECTIVE:  Alison Friedman is a 70 y.o. female presenting for dysuria and frequency times 2 days now. Had suprapubic pressure that started 4 days ago and has worsened.  Feels that she is getting worse.  Has taken keflex in the past without difficulty despite penicillin allergy. She has already taken one kelfex that she has left over from a previous visit this year and says she is already feeling better.She denies abnormal vaginal discharge, flank pain, fever, appetite changes and nausea.     She is allergic to nitrofurantoin monohyd macro; penicillins; tuna [fish allergy]; clarithromycin; crestor [rosuvastatin]; iodinated diagnostic agents; levofloxacin; lipitor [atorvastatin calcium]; percocet [oxycodone-acetaminophen]; and sulfa antibiotics.   She  has a past medical history of Allergy; Diabetes mellitus without complication ([place]); and Pulmonary embolism ([place]) (At age 37 ).    She  reports that she quit smoking about 15 months ago. Her smoking use included Cigarettes. She has a 35.00 pack-year smoking history. She has never used smokeless tobacco. She reports that she does not drink alcohol or use drugs. She  reports that she currently engages in sexual activity. She reports using the following method of birth control/protection: None. The patient  has a past surgical history that includes Breast surgery; Appendectomy; and Tubal ligation.  Her family history includes Osteoporosis in her mother and sister.  Review of Systems  Constitutional: Negative for fever.  Cardiovascular: Negative for chest pain.  Gastrointestinal: Negative for nausea and vomiting.  Genitourinary: Positive for dysuria, frequency and urgency. Negative for flank pain and hematuria.  Musculoskeletal: Negative for myalgias.  Neurological: Negative for dizziness and tingling.    The problem list and medications were reviewed and updated by myself where necessary and  exist elsewhere in the encounter.   OBJECTIVE:  BP 128/80 (BP Location: Left Arm, Patient Position: Sitting, Cuff Size: Large)   Pulse 72   Temp 98.1 F (36.7 C)   Resp 16   Ht 5\' 2"  (1.575 m)   Wt 168 lb (76.2 kg)   SpO2 97%   BMI 30.73 kg/m   Physical Exam  Constitutional: Vital signs are normal.  Non-toxic appearance.  Cardiovascular: Normal rate, regular rhythm and normal heart sounds.   Abdominal: Soft. Bowel sounds are normal. She exhibits no distension and no mass. There is tenderness in the suprapubic area. There is no rebound, no guarding and no CVA tenderness.  Skin: She is not diaphoretic.    Lab Results  Component Value Date   CREATININE 1.06 11/24/2015      Results for orders placed or performed in visit on 05/02/16 (from the past 72 hour(s))  POCT urinalysis dipstick     Status: Abnormal   Collection Time: 05/02/16  1:58 PM  Result Value Ref Range   Color, UA yellow yellow   Clarity, UA clear clear   Glucose, UA negative negative   Bilirubin, UA small (A) negative   Ketones, POC UA negative negative   Spec Grav, UA 1.020    Blood, UA negative negative   pH, UA 5.0    Protein Ur, POC negative negative   Urobilinogen, UA 0.2    Nitrite, UA Negative Negative   Leukocytes, UA Trace (A) Negative  POCT Microscopic Urinalysis ([facility])     Status: Abnormal   Collection Time: 05/02/16  2:02 PM  Result Value Ref Range   WBC,UR,HPF,POC Moderate (A) None WBC/hpf   RBC,UR,HPF,POC None None RBC/hpf   Bacteria  None None, Too numerous to count   Mucus Absent Absent   Epithelial Cells, UR Per Microscopy Few (A) None, Too numerous to count cells/hpf    No results found.  ASSESSMENT AND PLAN  [name] was seen today for urinary frequency and burning urinating.  Diagnoses and all orders for this visit:  Dysuria: Last GFR at 54.  Will dose keflex at 3/4 less that normal. She did take a kelfex last night and this will likely compromise her results. She has a strong  HPI for UTI and has some mild suprapubic tenderness.   -     POCT urinalysis dipstick -     POCT Microscopic Urinalysis ([facility])    The patient is advised to call or return to clinic if she does not see an improvement in symptoms, or to seek the care of the closest emergency department if she worsens with the above plan.   Philis Fendt, MHS, PA-C [facility] 05/02/2016 2:04 PM

## 2016-05-03 LAB — URINE CULTURE: ORGANISM ID, BACTERIA: NO GROWTH

## 2016-05-04 NOTE — Progress Notes (Signed)
She had been taking Keflex at the time we collected the sample.  Will advise that she continue therapy until complete. Please send a letter.

## 2016-05-15 DIAGNOSIS — Z9889 Other specified postprocedural states: Secondary | ICD-10-CM | POA: Diagnosis not present

## 2016-05-15 DIAGNOSIS — S83271D Complex tear of lateral meniscus, current injury, right knee, subsequent encounter: Secondary | ICD-10-CM | POA: Diagnosis not present

## 2016-05-15 DIAGNOSIS — S83232D Complex tear of medial meniscus, current injury, left knee, subsequent encounter: Secondary | ICD-10-CM | POA: Diagnosis not present

## 2016-05-24 ENCOUNTER — Ambulatory Visit: Payer: Medicare Other | Admitting: Family

## 2016-05-29 ENCOUNTER — Ambulatory Visit (INDEPENDENT_AMBULATORY_CARE_PROVIDER_SITE_OTHER): Payer: Medicare Other | Admitting: Family

## 2016-05-29 ENCOUNTER — Encounter: Payer: Self-pay | Admitting: Family

## 2016-05-29 VITALS — BP 132/80 | HR 63 | Temp 98.2°F | Resp 16 | Ht 62.0 in | Wt 171.0 lb

## 2016-05-29 DIAGNOSIS — J449 Chronic obstructive pulmonary disease, unspecified: Secondary | ICD-10-CM

## 2016-05-29 DIAGNOSIS — E119 Type 2 diabetes mellitus without complications: Secondary | ICD-10-CM | POA: Diagnosis not present

## 2016-05-29 DIAGNOSIS — Z01419 Encounter for gynecological examination (general) (routine) without abnormal findings: Secondary | ICD-10-CM | POA: Diagnosis not present

## 2016-05-29 DIAGNOSIS — F411 Generalized anxiety disorder: Secondary | ICD-10-CM | POA: Insufficient documentation

## 2016-05-29 MED ORDER — ALBUTEROL SULFATE HFA 108 (90 BASE) MCG/ACT IN AERS: INHALATION_SPRAY | RESPIRATORY_TRACT | 3 refills | Status: DC

## 2016-05-29 MED ORDER — METFORMIN HCL ER 500 MG PO TB24: 1000.0000 mg | ORAL_TABLET | Freq: Two times a day (BID) | ORAL | 3 refills | Status: DC

## 2016-05-29 NOTE — Assessment & Plan Note (Signed)
Type 2 diabetes appears adequately controlled current regimen and no adverse side effects. Blood sugars at home below 130 on average. Denies changes to vision or new symptoms of end organ damage. Diabetic foot exam completed today. Declines Pneumovax. Maintained on telmisartan for CAD risk reduction. Due for diabetic eye exam encouraged to be completed independently. Continue current dosage of metformin. Follow-up in 4 months or sooner if needed.

## 2016-05-29 NOTE — Assessment & Plan Note (Signed)
Currently maintained on alprazolam taken as needed. [place] controlled substance database reviewed with no irregularities. Symptoms appear adequately managed. Continue current dosage of alprazolam as needed.

## 2016-05-29 NOTE — Patient Instructions (Addendum)
Thank you for choosing [facility].  SUMMARY AND INSTRUCTIONS:  Medication:  Please continue to take your medications as prescribed.   Your prescription(s) have been submitted to your pharmacy or been printed and provided for you. Please take as directed and contact our office if you believe you are having problem(s) with the medication(s) or have any questions.   Referrals:  Referrals have been made during this visit. You should expect to hear back from our schedulers in about 7-10 days in regards to establishing an appointment with the specialists we discussed.   Follow up:  If your symptoms worsen or fail to improve, please contact our office for further instruction, or in case of emergency go directly to the emergency room at the closest medical facility.

## 2016-05-29 NOTE — Assessment & Plan Note (Signed)
COPD appears adequately controlled with current regimen of Symbicort and albuterol. No current exacerbations and pulmonary exam today is benign. Continue current dosage of Symbicort and albuterol. Continue to monitor.

## 2016-05-29 NOTE — Progress Notes (Signed)
Subjective:    Patient ID: Alison Friedman, female    DOB: [birth year], 70 y.o.   MRN: [id number]  Chief Complaint  Patient presents with  . Establish Care    referral to GYN    HPI:  Alison Friedman is a 70 y.o. female who  has a past medical history of Allergy; Anxiety; Asthma; Diabetes mellitus without complication ([place]); and Pulmonary embolism (HCC) (At age 79 ). and presents today for an office visit to establish care.    1.) Type 2 diabetes - Currently maintained on metformin. Reports taking the medications as prescribed and denies adverse side effects. Blood sugars at home remain on average below goal of 130. Denies changes to eyesight or new symptoms of end organ damage.   Lab Results  Component Value Date   HGBA1C 6.8 02/04/2016    2.) Cervical cancer screening - Needs referral to gynecologist for annual well woman exam.  3.) Anxiety - Currently maintained on alprazolam. Reports taking the medication as prescribed and denies adverse side effects. Medication does help to control her anxiety when needed.   4.) COPD - Currently maintained on Symbicort and albuterol. Reports taking medication as prescribed and denies adverse side effects. Albuterol use is minimal and well controlled with Symbicort.   Allergies  Allergen Reactions  . Nitrofurantoin Monohyd Macro Anaphylaxis  . Penicillins Anaphylaxis  . Tuna [Fish Allergy] Anaphylaxis  . Clarithromycin Other (See Comments)    Face turns red.  . Crestor [Rosuvastatin]   . Iodinated Diagnostic Agents     Prev CT reports state anaphylaxis with IV contrast, pt also states severe reaction with contrast  . Levofloxacin Other (See Comments)    Face turned red.  . Lipitor [Atorvastatin Calcium] Other (See Comments)    Muscle pain  . Percocet [Oxycodone-Acetaminophen] Swelling  . Sulfa Antibiotics Swelling      Outpatient Medications Prior to Visit  Medication Sig Dispense Refill  . ALPRAZolam (XANAX) 0.25 MG tablet TAKE  ONE-HALF TO ONE TABLET BY MOUTH AS NEEDED STRESS 20 tablet 3  . aspirin EC 81 MG tablet Take 81 mg by mouth daily.    . budesonide-formoterol (SYMBICORT) 160-4.5 MCG/ACT inhaler Inhale 2 puffs into the lungs 2 (two) times daily. 1 Inhaler 11  . dicyclomine (BENTYL) 20 MG tablet TAKE ONE TABLET BY MOUTH EVERY 6 HOURS 40 tablet 4  . EPINEPHrine 0.3 mg/0.3 mL IJ SOAJ injection Inject 0.3 mLs (0.3 mg total) into the muscle once. 1 Device 5  . famotidine (PEPCID) 20 MG tablet Take 20 mg by mouth 2 (two) times daily.    . fluticasone (FLONASE) 50 MCG/ACT nasal spray USE TWO SPRAY IN EACH NOSTRIL EVERY DAY 16 g 4  . ibuprofen (ADVIL,MOTRIN) 800 MG tablet Take 800 mg by mouth 1 day or 1 dose.    . telmisartan (MICARDIS) 40 MG tablet TAKE ONE TABLET BY MOUTH ONCE DAILY 30 tablet 4  . albuterol (VENTOLIN HFA) 108 (90 BASE) MCG/ACT inhaler INHALE TWO PUFFS BY MOUTH EVERY 4 HOURS AS NEEDED FOR WHEEZING OR SHORTNESS OF BREATH 18 each 3  . metFORMIN (GLUCOPHAGE-XR) 500 MG 24 hr tablet Take 2 tablets (1,000 mg total) by mouth 2 (two) times daily. 360 tablet 3  . cephALEXin (KEFLEX) 500 MG capsule Take 1 capsule (500 mg total) by mouth 3 (three) times daily. 15 capsule 0  . pravastatin (PRAVACHOL) 40 MG tablet Take 1 tablet (40 mg total) by mouth daily. 30 tablet 11   No facility-administered medications  prior to visit.       Past Surgical History:  Procedure Laterality Date  . APPENDECTOMY    . BREAST SURGERY    . NECK SURGERY     Disk and rod  . TUBAL LIGATION        Past Medical History:  Diagnosis Date  . Allergy   . Anxiety   . Asthma   . Diabetes mellitus without complication ([place])   . Pulmonary embolism ([place]) At age 55    With associated DVT      Review of Systems  Constitutional: Negative for chills and fever.  HENT: Negative for congestion.   Eyes:       Denies changes in vision  Respiratory: Negative for chest tightness, shortness of breath and wheezing.   Cardiovascular:  Negative for chest pain, palpitations and leg swelling.  Endocrine: Negative for polydipsia, polyphagia and polyuria.  Neurological: Negative for dizziness, weakness and numbness.      Objective:    BP 132/80 (BP Location: Left Arm, Patient Position: Sitting, Cuff Size: Normal)   Pulse 63   Temp 98.2 F (36.8 C) (Oral)   Resp 16   Ht 5\' 2"  (1.575 m)   Wt 171 lb (77.6 kg)   SpO2 98%   BMI 31.28 kg/m  Nursing note and vital signs reviewed.  Physical Exam  Constitutional: She is oriented to person, place, and time. She appears well-developed and well-nourished. No distress.  Cardiovascular: Normal rate, regular rhythm, normal heart sounds and intact distal pulses.   Pulmonary/Chest: Effort normal and breath sounds normal.  Neurological: She is alert and oriented to person, place, and time.  Diabetic Foot Exam - Simple   Simple Foot Form Diabetic Foot exam was performed with the following findings:  Yes  05/29/2016 12:48 PM  Visual Inspection No deformities, no ulcerations, no other skin breakdown bilaterally:  Yes Sensation Testing Intact to touch and monofilament testing bilaterally:  Yes Pulse Check Posterior Tibialis and Dorsalis pulse intact bilaterally:  Yes  Skin: Skin is warm and dry.  Psychiatric: She has a normal mood and affect. Her behavior is normal. Judgment and thought content normal.       Assessment & Plan:   Problem List Items Addressed This Visit      Respiratory   COPD, moderate ([place]) - Primary    COPD appears adequately controlled with current regimen of Symbicort and albuterol. No current exacerbations and pulmonary exam today is benign. Continue current dosage of Symbicort and albuterol. Continue to monitor.      Relevant Medications   albuterol (VENTOLIN HFA) 108 (90 Base) MCG/ACT inhaler     Endocrine   Diabetes mellitus (HCC)    Type 2 diabetes appears adequately controlled current regimen and no adverse side effects. Blood sugars at home  below 130 on average. Denies changes to vision or new symptoms of end organ damage. Diabetic foot exam completed today. Declines Pneumovax. Maintained on telmisartan for CAD risk reduction. Due for diabetic eye exam encouraged to be completed independently. Continue current dosage of metformin. Follow-up in 4 months or sooner if needed.      Relevant Medications   metFORMIN (GLUCOPHAGE-XR) 500 MG 24 hr tablet     Other   Generalized anxiety disorder    Currently maintained on alprazolam taken as needed. [place] controlled substance database reviewed with no irregularities. Symptoms appear adequately managed. Continue current dosage of alprazolam as needed.       Other Visit Diagnoses  Well woman exam       Relevant Orders   Ambulatory referral to Gynecology       I have discontinued Ms. Geddes's pravastatin and cephALEXin. I am also having her maintain her EPINEPHrine, fluticasone, dicyclomine, budesonide-formoterol, telmisartan, aspirin EC, ALPRAZolam, famotidine, ibuprofen, metFORMIN, and albuterol.   Meds ordered this encounter  Medications  . metFORMIN (GLUCOPHAGE-XR) 500 MG 24 hr tablet    Sig: Take 2 tablets (1,000 mg total) by mouth 2 (two) times daily.    Dispense:  360 tablet    Refill:  3    Order Specific Question:   Supervising Provider    Answer:   Pricilla Holm A [id number]  . albuterol (VENTOLIN HFA) 108 (90 Base) MCG/ACT inhaler    Sig: INHALE TWO PUFFS BY MOUTH EVERY 4 HOURS AS NEEDED FOR WHEEZING OR SHORTNESS OF BREATH    Dispense:  18 each    Refill:  3    Order Specific Question:   Supervising Provider    Answer:   Pricilla Holm A [id number]     Follow-up: Return in about 4 months (around 09/26/2016), or if symptoms worsen or fail to improve.  Mauricio Po, FNP

## 2016-06-02 ENCOUNTER — Encounter: Payer: Self-pay | Admitting: Acute Care

## 2016-06-12 ENCOUNTER — Ambulatory Visit (INDEPENDENT_AMBULATORY_CARE_PROVIDER_SITE_OTHER)
Admission: RE | Admit: 2016-06-12 | Discharge: 2016-06-12 | Disposition: A | Discharge status: Home / Self Care | Payer: Medicare Other | Source: Ambulatory Visit | Attending: Acute Care | Admitting: Acute Care

## 2016-06-12 DIAGNOSIS — Z87891 Personal history of nicotine dependence: Secondary | ICD-10-CM

## 2016-06-12 DIAGNOSIS — F17210 Nicotine dependence, cigarettes, uncomplicated: Secondary | ICD-10-CM | POA: Diagnosis not present

## 2016-06-13 ENCOUNTER — Telehealth: Payer: Self-pay | Admitting: Acute Care

## 2016-06-13 DIAGNOSIS — F17210 Nicotine dependence, cigarettes, uncomplicated: Secondary | ICD-10-CM

## 2016-06-13 NOTE — Telephone Encounter (Signed)
I have called Alison Friedman with the results of her low-dose screening CT. I explained her scan was read as a lung RADS 2 indicating nodules that are benign in appearance and behavior. Recommendation per radiology is for repeat annual screening with low-dose CT in November 2018 which we will order and schedule at that time. We discussed that additional findings included some right upper lobe atelectasis, emphysema and imaging findings suggestive of underlying COPD. The patient was artery aware of these findings. Additionally we discussed that there was aortic atherosclerosis and coronary artery calcification. The patient has tried multiple statin drugs and has been intolerant of them. She is currently a participant in a research study for a drug that is developed to remove cholesterol to the liver that is not a statin. She is hopeful that this will help control her cholesterol as she is unable to take statins. I will send the results of the scan to her primary care physician. Alison Friedman verbalized understanding of these results and had no further questions at completion of the call.

## 2016-06-29 ENCOUNTER — Encounter: Payer: Self-pay | Admitting: Gynecology

## 2016-06-29 ENCOUNTER — Other Ambulatory Visit: Payer: Self-pay | Admitting: Gynecology

## 2016-06-29 ENCOUNTER — Ambulatory Visit (INDEPENDENT_AMBULATORY_CARE_PROVIDER_SITE_OTHER): Payer: Medicare Other | Admitting: Gynecology

## 2016-06-29 VITALS — BP 140/80 | Ht 62.5 in | Wt 172.0 lb

## 2016-06-29 DIAGNOSIS — Z124 Encounter for screening for malignant neoplasm of cervix: Secondary | ICD-10-CM

## 2016-06-29 DIAGNOSIS — Z01411 Encounter for gynecological examination (general) (routine) with abnormal findings: Secondary | ICD-10-CM

## 2016-06-29 DIAGNOSIS — N766 Ulceration of vulva: Secondary | ICD-10-CM

## 2016-06-29 DIAGNOSIS — N941 Unspecified dyspareunia: Secondary | ICD-10-CM | POA: Diagnosis not present

## 2016-06-29 DIAGNOSIS — Z1151 Encounter for screening for human papillomavirus (HPV): Secondary | ICD-10-CM | POA: Diagnosis not present

## 2016-06-29 DIAGNOSIS — N952 Postmenopausal atrophic vaginitis: Secondary | ICD-10-CM | POA: Diagnosis not present

## 2016-06-29 LAB — URINALYSIS W MICROSCOPIC + REFLEX CULTURE
Bilirubin Urine: NEGATIVE
CRYSTALS: NONE SEEN [HPF]
Casts: NONE SEEN [LPF]
GLUCOSE, UA: NEGATIVE
HGB URINE DIPSTICK: NEGATIVE
KETONES UR: NEGATIVE
Nitrite: NEGATIVE
PROTEIN: NEGATIVE
RBC / HPF: NONE SEEN RBC/HPF (ref <=2)
Specific Gravity, Urine: <1.005 (ref 1.001–1.035)
Yeast: NONE SEEN [HPF]
pH: 5 (ref 5.0–8.0)

## 2016-06-29 MED ORDER — CLOBETASOL PROPIONATE 0.05 % EX CREA: TOPICAL_CREAM | CUTANEOUS | 2 refills | Status: DC

## 2016-06-29 NOTE — Progress Notes (Signed)
Alison Friedman 21-Mar-1946 [id number]   History:    70 y.o.  for annual gyn exam who is a new patient to the practice she was previously being followed by Dr. Ruthann Cancer. Patient states in time she feels vaginal pressure and wanted to make sure she didn't have any form of prolapse. She denies any stress urinary incontinence or any bowel movement problems. She does have some irritation external genitalia and is not on any hormone replacement therapy uses Vaseline or [place] gel when become sexually active. Patient stated many years ago when she was in her 56s she had cryotherapy of her cervix and subsequent Paps have been normal. She also was a [employer] information [employer] manager and had a pulmonary embolism in the 1970s. Patient with history of colon polyp she is on 5 year recall and she is due for a colonoscopy later this month.  Patient is currently being treated by her endocrinologist for osteoporosis. She cannot tolerate alendronate and is in process of seeing him in January. I'm going to give her literature information to read on Prolia.  Past medical history,surgical history, family history and social history were all reviewed and documented in the EPIC chart.  Gynecologic History No LMP recorded. Patient is postmenopausal. Contraception: post menopausal status Last Pap: Over 4 years ago. Results were: normal Last mammogram: 2017. Results were: normal  Obstetric History OB History  Gravida Para Term Preterm AB Living  2 2       2   SAB TAB Ectopic Multiple Live Births               # Outcome Date GA Lbr Len/2nd Weight Sex Delivery Anes PTL Lv  2 Para           1 Para                ROS: A ROS was performed and pertinent positives and negatives are included in the history.  GENERAL: No fevers or chills. HEENT: No change in vision, no earache, sore throat or sinus congestion. NECK: No pain or stiffness. CARDIOVASCULAR: No chest pain or pressure. No palpitations. PULMONARY: No shortness of breath, cough or wheeze.  GASTROINTESTINAL: No abdominal pain, nausea, vomiting or diarrhea, melena or bright red blood per rectum. GENITOURINARY: No urinary frequency, urgency, hesitancy or dysuria. MUSCULOSKELETAL: No joint or muscle pain, no back pain, no recent trauma. DERMATOLOGIC: No rash, no itching, no lesions. ENDOCRINE: No polyuria, polydipsia, no heat or cold intolerance. No recent change in weight. HEMATOLOGICAL: No anemia or easy bruising or bleeding. NEUROLOGIC: No headache, seizures, numbness, tingling or weakness. PSYCHIATRIC: No depression, no loss of interest in normal activity or change in sleep pattern.     Exam: chaperone present  BP 140/80   Ht 5' 2.5" (1.588 m)   Wt 172 lb (78 kg)   BMI 30.96 kg/m   Body mass index is 30.96 kg/m.  General appearance : Well developed well nourished female. No acute distress HEENT: Eyes: no retinal hemorrhage or exudates,  Neck supple, trachea midline, no carotid bruits, no thyroidmegaly Lungs: Clear to auscultation, no rhonchi or wheezes, or rib retractions  Heart: Regular rate and rhythm, no murmurs or gallops Breast:Examined in sitting and supine position were symmetrical in appearance, no palpable masses or tenderness,  no skin retraction, no nipple inversion, no nipple discharge, no skin discoloration, no axillary or supraclavicular lymphadenopathy Abdomen: no palpable masses or tenderness, no rebound or guarding Extremities: no edema or skin discoloration or tenderness  Pelvic: As per  her portion of the left labia majora there appeared to be an irritated/ulcerated area and a smaller one in the upper right labia majora and severe atrophic changes were noted.  Bartholin, Urethra, Skene Glands: Within normal limits             Vagina: No gross lesions or discharge, vaginal atrophy  Cervix: No gross lesions or discharge  Uterus  axial, normal size, shape and consistency, non-tender and mobile  Adnexa  Without masses or tenderness  Anus and perineum  normal     Rectovaginal  normal sphincter tone without palpated masses or tenderness             Hemoccult PCP     Assessment/Plan:  70 y.o. female for annual exam with severe vaginal atrophy and vulvar dryness and irritation. She's going to be place on clobetasol 0.05% cream to apply every night at bedtime for the first week and then the second week apply twice a week and in the third week once a week. I've asked her to return back to the office in 3 weeks for follow-up evaluation. If still persistent we'll proceed with a biopsy. Pap smear with HPV screening done today and then she will no longer needed according to the new guidelines. We are doing this because she is a new patient to the practice. She is scheduled for her colonoscopy in a few weeks. We discussed importance of calcium vitamin D and weightbearing exercises. Patient to follow-up with her endocrinologist in January to go over treatment options for her osteoporosis since she cannot tolerate alendronate. Her lowest T score based on the last bone density study was -2.6 at the right femoral neck. Patient's vaccines and blood work is being done by her PCP.  An additional 15 minutes was spent assessing the irritation of the external genitalia with magnifying lens. Also we went over different treatment options for vulvar dystrophy and vaginal atrophy. We reviewed her past history of DVT and probably we must avoid any estrogen at the present time. Also we discussed treatment with hypodense he steroid for the next few weeks and return to the office for reassessment 3 weeks. Her these lesions are still present we will proceed then with biopsy. Also we will wait her urine cultures has a urinalysis demonstrated 10-20 WBC, few bacteria.    Terrance Mass MD, 3:39 PM 06/29/2016

## 2016-06-30 ENCOUNTER — Telehealth: Payer: Self-pay | Admitting: *Deleted

## 2016-06-30 LAB — PAP, TP IMAGING W/ HPV RNA, RFLX HPV TYPE 16,18/45: HPV mRNA, High Risk: NOT DETECTED

## 2016-06-30 MED ORDER — CLOBETASOL PROPIONATE 0.05 % EX CREA: TOPICAL_CREAM | CUTANEOUS | 2 refills | Status: DC

## 2016-06-30 NOTE — Telephone Encounter (Signed)
Pt was seen yesterday and prescribed temovate cream 0.05%, pharmacy never received Rx as it was set on print. Rx will be re-sent

## 2016-07-02 LAB — URINE CULTURE

## 2016-07-03 ENCOUNTER — Other Ambulatory Visit: Payer: Self-pay | Admitting: Gynecology

## 2016-07-03 MED ORDER — CEFUROXIME AXETIL 250 MG PO TABS: 250.0000 mg | ORAL_TABLET | Freq: Two times a day (BID) | ORAL | 0 refills | Status: DC

## 2016-07-03 NOTE — Progress Notes (Signed)
My previous note stated that all her allergies were reviewed and that the only antibiotic which there is minimal cross-reactivity would be the Ceftin. There is no other antibiotic that is sensitive to her microorganism detected on her urine culture. Instead of Cefotan for 5 days we will give it to her for 3 days instead 1 tablet twice a day 6 tablets total

## 2016-07-19 ENCOUNTER — Encounter: Payer: Self-pay | Admitting: Gynecology

## 2016-07-19 ENCOUNTER — Ambulatory Visit (INDEPENDENT_AMBULATORY_CARE_PROVIDER_SITE_OTHER): Payer: Medicare Other | Admitting: Gynecology

## 2016-07-19 VITALS — BP 124/70

## 2016-07-19 DIAGNOSIS — N763 Subacute and chronic vulvitis: Secondary | ICD-10-CM | POA: Diagnosis not present

## 2016-07-19 DIAGNOSIS — N9089 Other specified noninflammatory disorders of vulva and perineum: Secondary | ICD-10-CM | POA: Diagnosis not present

## 2016-07-19 NOTE — Patient Instructions (Signed)
Lichen Sclerosus Introduction Lichen sclerosus is a skin problem. It can happen on any part of the body, but it commonly involves the anal or genital areas. It can cause itching and discomfort in these areas. Treatment can help to control symptoms. When the genital area is affected, getting treatment is important because the condition can cause scarring that may lead to other problems. What are the causes? The cause of this condition is not known. It could be the result of an overactive immune system or a lack of certain hormones. Lichen sclerosus is not an infection or a fungus. It is not passed from one person to another (not contagious). What increases the risk? This condition is more likely to develop in women, usually after menopause. What are the signs or symptoms? Symptoms of this condition include:  Thin, wrinkled, white areas on the skin.  Thickened white areas on the skin.  Red and swollen patches (lesions) on the skin.  Tears or cracks in the skin.  Bruising.  Blood blisters.  Severe itching. You may also have pain, itching, or burning with urination. Constipation is also common in people with lichen sclerosus. How is this diagnosed? This condition may be diagnosed with a physical exam. In some cases, a tissue sample (biopsy sample) may be removed to be looked at under a microscope. How is this treated? This condition is usually treated with medicated creams or ointments (topical steroids) that are applied over the affected areas. Follow these instructions at home:  Take over-the-counter and prescription medicines only as told by your health care provider.  Use creams or ointments as told by your health care provider.  Do not scratch the affected areas of skin.  Women should keep the vaginal area as clean and dry as possible.  Keep all follow-up visits as told by your health care provider. This is important. Contact a health care provider if:  You have increasing  redness, swelling, or pain in the affected area.  You have fluid, blood, or pus coming from the affected area.  You have new lesions on your skin.  You have a fever.  You have pain during sex. This information is not intended to replace advice given to you by your health care provider. Make sure you discuss any questions you have with your health care provider. Document Released: 11/23/2010 Document Revised: 12/09/2015 Document Reviewed: 09/28/2014  2017 [employer]vier

## 2016-07-19 NOTE — Progress Notes (Signed)
   Patient is a 71 year old who was seen in the office December 14 as a new patient to see previous note for detail. At time of her exam she was found to have an ulcerated area on her labia majora and minora and in dryness and it was suspected she may have lichen sclerosus. She was placed on clobetasol 0.05% cream which she applied every night for the first week and the second week she applied it twice a week and the third week until now once a week and she's here for follow-up.  Physical Exam  Genitourinary:     This area has not changed since prior to treatment. It was recommended to proceed with a keypunch biopsy. The area was cleansed with Betadine solution and 1% lidocaine was infiltrated subdermally and a keypunch biopsy was done from the center of the lesion. Silver nitrate was used for hemostasis and lidocaine gel for additional comfort was applied after the biopsy.  Assessment/plan: Postmenopausal patient with ulcerated area inferior portion of the left labia majora biopsy obtained to rule out vulvar cancer. She will hold off on the clobetasol until she hears from me.

## 2016-07-20 ENCOUNTER — Other Ambulatory Visit: Payer: Medicare Other

## 2016-07-21 ENCOUNTER — Other Ambulatory Visit (INDEPENDENT_AMBULATORY_CARE_PROVIDER_SITE_OTHER): Payer: Medicare Other

## 2016-07-21 DIAGNOSIS — E119 Type 2 diabetes mellitus without complications: Secondary | ICD-10-CM

## 2016-07-21 LAB — BASIC METABOLIC PANEL
BUN: 26 mg/dL — ABNORMAL HIGH (ref 6–23)
CALCIUM: 9.1 mg/dL (ref 8.4–10.5)
CHLORIDE: 108 meq/L (ref 96–112)
CO2: 24 mEq/L (ref 19–32)
CREATININE: 1.15 mg/dL (ref 0.40–1.20)
GFR: 49.49 mL/min — AB (ref >60.00)
Glucose, Bld: 114 mg/dL — ABNORMAL HIGH (ref 70–99)
Potassium: 4.3 mEq/L (ref 3.5–5.1)
Sodium: 140 mEq/L (ref 135–145)

## 2016-07-21 LAB — HEMOGLOBIN A1C: HEMOGLOBIN A1C: 6.9 % — AB (ref 4.6–6.5)

## 2016-07-25 ENCOUNTER — Ambulatory Visit (INDEPENDENT_AMBULATORY_CARE_PROVIDER_SITE_OTHER): Payer: Medicare Other | Admitting: Endocrinology

## 2016-07-25 ENCOUNTER — Encounter: Payer: Self-pay | Admitting: Endocrinology

## 2016-07-25 VITALS — BP 130/80 | HR 61 | Ht 63.0 in | Wt 172.0 lb

## 2016-07-25 DIAGNOSIS — M810 Age-related osteoporosis without current pathological fracture: Secondary | ICD-10-CM | POA: Diagnosis not present

## 2016-07-25 DIAGNOSIS — E559 Vitamin D deficiency, unspecified: Secondary | ICD-10-CM | POA: Diagnosis not present

## 2016-07-25 NOTE — Progress Notes (Signed)
Patient ID: Alison Friedman, female   DOB: [birth year], 71 y.o.   MRN: [id number]           Chief complaint: Osteoporosis   History of Present Illness:  She had been referred in 2017 for management of osteoporosis  Initial history as follows: She has lost about 1 inch in height over the years She had periodic screening bone density measurements done and these have showed the following T-scores on the last measurement in 3/17: Femoral neck: Measurements  -2.6 and -2.5 Spine: -2.1 FRAX fracture risk at the hip is not calculated    Apparently her bone density has declined 7-10% at various sites  From prior measurements  She has no history of low trauma fracture or height loss  Age at menopause: 40-42 years.  She took hormone replacement only for a few months and has not had a hysterectomy Other risk factors: Prior history of smoking, she stopped in 2008 Positive family history of osteoporosis including her sister, mother and grandmother  Previous treatment: None Calcium supplements: None, she has intolerance to these because of constipation even if using calcium citrate or chewable calcium.  She does have some dairy products like yogurt and cheese and her diet and recently more milk. Exercise: She tries to walk as much as possible especially at her work  RECENT history: Current treatment: None Vitamin D supplements: D3, 2000 units daily  She was prescribed Actonel 150 mg weekly in 5/17 She said that after she took this she had chest discomfort and flulike symptoms as well as diarrhea for 4-5 days She did not report that she had side effects and is coming now for follow-up   LABS:  Vitamin D level in 2015 was 31  Lab Results  Component Value Date   VD25OH 18.22 (L) 11/24/2015     Past Medical History:  Diagnosis Date  . Allergy   . Anxiety   . Asthma   . Diabetes mellitus without complication ([place])   . Pulmonary embolism ([place]) At age 2    With associated DVT    Past  Surgical History:  Procedure Laterality Date  . APPENDECTOMY    . BREAST SURGERY    . NECK SURGERY     Disk and rod  . TUBAL LIGATION      Family History  Problem Relation Age of Onset  . Osteoporosis Mother   . Osteoporosis Sister   . Breast cancer Daughter 3  . Dementia Maternal Grandmother   . Stroke Maternal Grandfather     Social History:  reports that she quit smoking about 18 months ago. Her smoking use included Cigarettes. She has a 35.00 pack-year smoking history. She has never used smokeless tobacco. She reports that she does not drink alcohol or use drugs.  Allergies:  Allergies  Allergen Reactions  . Nitrofurantoin Monohyd Macro Anaphylaxis  . Penicillins Anaphylaxis  . Tuna [Fish Allergy] Anaphylaxis  . Clarithromycin Other (See Comments)    Face turns red.  . Crestor [Rosuvastatin]   . Iodinated Diagnostic Agents     Prev CT reports state anaphylaxis with IV contrast, pt also states severe reaction with contrast  . Levofloxacin Other (See Comments)    Face turned red.  . Lipitor [Atorvastatin Calcium] Other (See Comments)    Muscle pain  . Percocet [Oxycodone-Acetaminophen] Swelling  . Sulfa Antibiotics Swelling    Allergies as of 07/25/2016      Reactions   Nitrofurantoin Monohyd Macro Anaphylaxis   Penicillins Anaphylaxis  Geralyn Flash [fish Allergy] Anaphylaxis   Clarithromycin Other (See Comments)   Face turns red.   Crestor [rosuvastatin]    Iodinated Diagnostic Agents    Prev CT reports state anaphylaxis with IV contrast, pt also states severe reaction with contrast   Levofloxacin Other (See Comments)   Face turned red.   Lipitor [atorvastatin Calcium] Other (See Comments)   Muscle pain   Percocet [oxycodone-acetaminophen] Swelling   Sulfa Antibiotics Swelling      Medication List       Accurate as of 07/25/16  8:32 AM. Always use your most recent med list.          albuterol 108 (90 Base) MCG/ACT inhaler Commonly known as:  VENTOLIN  HFA INHALE TWO PUFFS BY MOUTH EVERY 4 HOURS AS NEEDED FOR WHEEZING OR SHORTNESS OF BREATH   ALPRAZolam 0.25 MG tablet Commonly known as:  XANAX TAKE ONE-HALF TO ONE TABLET BY MOUTH AS NEEDED STRESS   aspirin EC 81 MG tablet Take 81 mg by mouth daily.   budesonide-formoterol 160-4.5 MCG/ACT inhaler Commonly known as:  SYMBICORT Inhale 2 puffs into the lungs 2 (two) times daily.   cefUROXime 250 MG tablet Commonly known as:  CEFTIN Take 1 tablet (250 mg total) by mouth 2 (two) times daily with a meal.   clobetasol cream 0.05 % Commonly known as:  TEMOVATE Apply nightly at bedtime for x 1 week, Week 2 apply twice a weekly, third week and thereafter apply once a week external genitalia   dicyclomine 20 MG tablet Commonly known as:  BENTYL TAKE ONE TABLET BY MOUTH EVERY 6 HOURS   EPINEPHrine 0.3 mg/0.3 mL Soaj injection Commonly known as:  EPI-PEN Inject 0.3 mLs (0.3 mg total) into the muscle once.   famotidine 20 MG tablet Commonly known as:  PEPCID Take 20 mg by mouth 2 (two) times daily.   fluticasone 50 MCG/ACT nasal spray Commonly known as:  FLONASE USE TWO SPRAY IN EACH NOSTRIL EVERY DAY   ibuprofen 800 MG tablet Commonly known as:  ADVIL,MOTRIN Take 800 mg by mouth 1 day or 1 dose.   metFORMIN 500 MG 24 hr tablet Commonly known as:  GLUCOPHAGE-XR Take 2 tablets (1,000 mg total) by mouth 2 (two) times daily.   telmisartan 40 MG tablet Commonly known as:  MICARDIS TAKE ONE TABLET BY MOUTH ONCE DAILY        Review of Systems   She has type 2 diabetes diagnosed in 2003.   Currently on metformin 500 mg ER twice a day only She checks her blood sugars only in the morning and these are near normal Has not followed up with PCP recently  Lab Results  Component Value Date   HGBA1C 6.9 (H) 07/21/2016   HGBA1C 6.8 02/04/2016   HGBA1C 7.0 10/14/2015   Lab Results  Component Value Date   MICROALBUR 0.2 07/01/2015   LDLCALC 93 07/01/2015   CREATININE 1.15  07/21/2016   Previous thyroid levels:  Lab Results  Component Value Date   TSH 4.17 11/24/2015   TSH 3.147 05/09/2011   FREET4 0.81 11/24/2015     LABS:  Lab on 07/21/2016  Component Date Value Ref Range Status  . Sodium 07/21/2016 140  135 - 145 mEq/L Final  . Potassium 07/21/2016 4.3  3.5 - 5.1 mEq/L Final  . Chloride 07/21/2016 108  96 - 112 mEq/L Final  . CO2 07/21/2016 24  19 - 32 mEq/L Final  . Glucose, Bld 07/21/2016 114* 70 - 99 mg/dL  Final  . BUN 07/21/2016 26* 6 - 23 mg/dL Final  . Creatinine, Ser 07/21/2016 1.15  0.40 - 1.20 mg/dL Final  . Calcium 07/21/2016 9.1  8.4 - 10.5 mg/dL Final  . GFR 07/21/2016 49.49* >60.00 mL/min Final  . Hgb A1c MFr Bld 07/21/2016 6.9* 4.6 - 6.5 % Final     PHYSICAL EXAM:  BP 130/80   Pulse 61   Ht 5\' 3"  (1.6 m)   Wt 172 lb (78 kg)   SpO2 96%   BMI 30.47 kg/m   Exam not indicated   ASSESSMENT:   OSTEOPOROSIS with low T score of the femoral neck  Currently untreated and apparently had GI side effects from Actonel Also has had vitamin D deficiency She tried to Actonel in 11/2015 but is not on any treatment, she did not report the possible side effects from the medication until today  Diabetes: She has a BMI of 30 and an A1c of 6.9 and taking suboptimal doses of metformin Not checking postprandial reading   PLAN:    Since she may have flulike symptoms with Reclast will have her try Prolia.  Discussed how this works and overall relative safety and long-term efficacy, will get this prior authorization done  Meanwhile continue vitamin D and calcium  Follow-up vitamin D levels to be checked  She will need to increase her metformin to at least 3 a day and if tolerated 4 tablets a day for maximum efficacy and needs follow-up with PCP  Encouraged her to check more readings after meals  Jenascia Bumpass 07/25/2016, 8:32 AM

## 2016-07-25 NOTE — Patient Instructions (Signed)
2 metformin at dinner  Check blood sugars on waking up 2x per week   Also check blood sugars about 2 hours after a meal and do this after different meals by rotation  Recommended blood sugar levels on waking up is 90-130 and about 2 hours after meal is 130-160  Please bring your blood sugar monitor to each visit, thank you

## 2016-08-10 ENCOUNTER — Other Ambulatory Visit: Payer: Self-pay

## 2016-08-10 MED ORDER — TELMISARTAN 40 MG PO TABS: 40.0000 mg | ORAL_TABLET | Freq: Every day | ORAL | 0 refills | Status: DC

## 2016-08-14 DIAGNOSIS — D125 Benign neoplasm of sigmoid colon: Secondary | ICD-10-CM | POA: Diagnosis not present

## 2016-08-14 DIAGNOSIS — K635 Polyp of colon: Secondary | ICD-10-CM | POA: Diagnosis not present

## 2016-08-14 DIAGNOSIS — D124 Benign neoplasm of descending colon: Secondary | ICD-10-CM | POA: Diagnosis not present

## 2016-08-14 DIAGNOSIS — Z86010 Personal history of colonic polyps: Secondary | ICD-10-CM | POA: Diagnosis not present

## 2016-08-14 DIAGNOSIS — D123 Benign neoplasm of transverse colon: Secondary | ICD-10-CM | POA: Diagnosis not present

## 2016-08-15 ENCOUNTER — Telehealth: Payer: Self-pay | Admitting: Family

## 2016-08-15 NOTE — Telephone Encounter (Signed)
Rec'd from [facility] forward 2 pages to Dr. Mauricio Po

## 2016-08-25 ENCOUNTER — Telehealth: Payer: Self-pay | Admitting: *Deleted

## 2016-08-25 NOTE — Telephone Encounter (Signed)
I contacted the patient and advised of message. Patient stated she is going to speak with her husband and called back and advise on how she is going to proceed with the prolia.

## 2016-08-25 NOTE — Telephone Encounter (Signed)
Verification of benefits have been processed and an approval has been received for pts prolia injection. Pts estimated cost are appx $300. This is only an estimate and cannot be confirmed until benefits are paid. Please advise pt and schedule if needed. If scheduled, once the injection is received, pls contact me back with the date it was received so that I am able to update prolia folder. Should the pt not be able to cover this amt, pls contact me back so that we are able to see what assistance programs are available. thanks

## 2016-08-25 NOTE — Telephone Encounter (Signed)
Thanks! Should she decide not to receive it, let me know that as well so that I am able to remove her from the prolia folder. She can always be added back should she change her mind.

## 2016-08-31 ENCOUNTER — Telehealth: Payer: Self-pay | Admitting: Family

## 2016-08-31 NOTE — Telephone Encounter (Signed)
Please inform patient that her colonoscopy showed that she did have polyps with 1 that was considered pre-cancerous. Recommend follow up in 3 years.

## 2016-09-01 NOTE — Telephone Encounter (Signed)
Pt aware.

## 2016-09-14 ENCOUNTER — Ambulatory Visit (INDEPENDENT_AMBULATORY_CARE_PROVIDER_SITE_OTHER): Payer: Medicare Other | Admitting: Gynecology

## 2016-09-14 ENCOUNTER — Encounter: Payer: Self-pay | Admitting: Gynecology

## 2016-09-14 VITALS — BP 132/80

## 2016-09-14 DIAGNOSIS — Z09 Encounter for follow-up examination after completed treatment for conditions other than malignant neoplasm: Secondary | ICD-10-CM

## 2016-09-14 DIAGNOSIS — L438 Other lichen planus: Secondary | ICD-10-CM

## 2016-09-14 NOTE — Progress Notes (Signed)
   Patient presented to the office today for her postop visit. Patient is doing well after her labia biopsy back in January 2018. Her history as follows:  Patient was seen in the office in December 2017 as a new patient she had been found to have an ulcerated area on the superior portion between the labia minora and labia majora and it was suspected she had lichen sclerosus and was started on clobetasol 0.5% which she apply daily at bedtime for the first week then twice a week the second week and once week third week and was instructed to follow-up. It had not clear completely so she returned back to the office on 07/19/2016 which she underwent a key punch biopsy and pathology report demonstrated the following:  Diagnosis Labium, biopsy, left upper inner majora - LICHENOID INFLAMMATION. - SEE MICROSCOPIC DESCRIPTION. Microscopic Comment There is a dense band of lichenoid chronic inflammation in the superficial stroma which extends to the base and into the overlying squamous epidermis which is partially denuded. The epidermis shows reactive changes consistent with inflammatory reactive changes. The differential includes an unusual lichenoid drug reaction, lichen planus, and fungus. PAS stain for fungus will be performed and reported as an addendum  Exam today: External genitalia as well as the area previously treated completely healed.  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 Patient will continue to apply clobetasol 0.05% once a week and increase it twice we when necessary with any vulvar dryness or irritation. Patient scheduled to return to the office in December 2018 for annual exam.

## 2016-09-14 NOTE — Patient Instructions (Signed)
Lichen Planus Lichen planus is a skin problem. It causes redness, itching, small bumps, and sores. Areas of the body that are often affected include:  Arms, wrists, legs, or ankles.  Chest, back, or belly (abdomen).  Genital areas such as the vulva and vagina.  Gums and inside of the mouth.  Scalp.  Fingernails or toenails. Treatment can help to control symptoms. This condition is not passed from one person to another (not contagious). It can last for a long time. Follow these instructions at home:  Take over-the-counter and prescription medicines only as told by your doctor.  Use creams or ointments as told by your doctor.  Do not scratch the affected areas of skin.  Women should keep the vagina as clean and dry as they can. Contact a doctor if:  Your redness, swelling, or pain gets worse.  You have fluid, blood, or pus coming from the affected area.  Your eyes become irritated. This information is not intended to replace advice given to you by your health care provider. Make sure you discuss any questions you have with your health care provider. Document Released: 06/15/2008 Document Revised: 12/09/2015 Document Reviewed: 09/28/2014 Elsevier Interactive Patient Education  2017 [employer]  

## 2016-09-26 ENCOUNTER — Ambulatory Visit (INDEPENDENT_AMBULATORY_CARE_PROVIDER_SITE_OTHER): Payer: Medicare Other | Admitting: Family

## 2016-09-26 ENCOUNTER — Encounter: Payer: Self-pay | Admitting: Family

## 2016-09-26 VITALS — BP 138/82 | HR 65 | Temp 98.2°F | Resp 16 | Ht 63.0 in | Wt 181.0 lb

## 2016-09-26 DIAGNOSIS — E6609 Other obesity due to excess calories: Secondary | ICD-10-CM | POA: Diagnosis not present

## 2016-09-26 DIAGNOSIS — Z6832 Body mass index (BMI) 32.0-32.9, adult: Secondary | ICD-10-CM

## 2016-09-26 DIAGNOSIS — F411 Generalized anxiety disorder: Secondary | ICD-10-CM | POA: Diagnosis not present

## 2016-09-26 DIAGNOSIS — I10 Essential (primary) hypertension: Secondary | ICD-10-CM

## 2016-09-26 DIAGNOSIS — E119 Type 2 diabetes mellitus without complications: Secondary | ICD-10-CM | POA: Diagnosis not present

## 2016-09-26 MED ORDER — ALPRAZOLAM 0.25 MG PO TABS: 0.1250 mg | ORAL_TABLET | Freq: Every day | ORAL | 1 refills | Status: DC | PRN

## 2016-09-26 MED ORDER — TELMISARTAN 40 MG PO TABS: 40.0000 mg | ORAL_TABLET | Freq: Every day | ORAL | 1 refills | Status: DC

## 2016-09-26 NOTE — Assessment & Plan Note (Signed)
Blood pressure remains well-controlled and below goal 140/90 with current medication regimen and no adverse side effects. Denies worst headache of life with no symptoms of end organ damage noted on physical exam. Continue current dosage of Micardis. Monitor blood pressure at home and follow low-sodium diet. Continue to monitor.

## 2016-09-26 NOTE — Assessment & Plan Note (Signed)
BMI of 32 with most recent weight gain of approximately 11 pounds. Start calorie tracking to determine current eating patterns. Continue current physical activity with goal of 30 minutes of moderate level activity most days a week. Encourage weight loss of 5-10% of current body weight through nutrition and physical activity. Does qualify for weight loss medications with a BMI of 32 and multiple comorbid conditions. Follow-up in one month or sooner with dietary log.

## 2016-09-26 NOTE — Assessment & Plan Note (Addendum)
Anxiety appears likely controlled current medication regimen and no adverse side effects. Emphasized importance of stress and stress management. [place] controlled substance database reviewed with no irregularities. Continue current dosage of alprazolam. Continue to monitor.

## 2016-09-26 NOTE — Progress Notes (Signed)
Subjective:    Patient ID: Alison Friedman, female    DOB: 1946-06-14, 71 y.o.   MRN: [id number]  Chief Complaint  Patient presents with  . Follow-up    refill of xanax, telmisartan, and metformin    HPI:  Alison Friedman is a 71 y.o. female who  has a past medical history of Allergy; Anxiety; Asthma; Diabetes mellitus without complication ([place]); and Pulmonary embolism ([facility]) (At age 65 ). and presents today for a follow up office visit.  1.) Diabetes -  Reports taking the medication as prescribed and denies adverse side effects or hypoglycemic readings. Blood sugars at home averaging around 120 . Denies new symptoms of end organ damage. No excessive hunger, thirst, or urination. Working on a low/carbohydrate modified oral intake. Physical activity includes walking and doing workouts in the pool 2-3 times per week.    Lab Results  Component Value Date   HGBA1C 6.9 (H) 07/21/2016    Lab Results  Component Value Date   CREATININE 1.15 07/21/2016   BUN 26 (H) 07/21/2016   NA 140 07/21/2016   K 4.3 07/21/2016   CL 108 07/21/2016   CO2 24 07/21/2016    2.) Hypertension - Currently maintained on telmisartan and reports taking the medications as prescribed and denies adverse side effects or hypotensive readings. Blood pressures at home have been well controlled. Denies changes in vision, worst headache of life or new symptoms of end organ damage. Working on following a low sodium diet.  BP Readings from Last 3 Encounters:  09/26/16 138/82  09/14/16 132/80  07/25/16 130/80   3.) Anxiety - Currently maintained on alprazolam. Reports taking the medication as prescribed and denies adverse side effects. Notes symptoms daily well-controlled current medication regimen.  4.) Weight issues - Most recently completed smoking cessation has noted significant waking since previous office visit. Denies any changes to appetite or nutritional intake. She is exercising 2-3 times per week. Notes that  every time she continues to quit smoking she gains a significant amount of weight in a short period of time. No previous history of calorie tracking or weight management program. She is motivated not to regain weight.  Wt Readings from Last 3 Encounters:  09/26/16 181 lb (82.1 kg)  07/25/16 172 lb (78 kg)  06/29/16 172 lb (78 kg)     Allergies  Allergen Reactions  . Nitrofurantoin Monohyd Macro Anaphylaxis  . Penicillins Anaphylaxis  . Tuna [Fish Allergy] Anaphylaxis  . Clarithromycin Other (See Comments)    Face turns red.  . Crestor [Rosuvastatin]   . Iodinated Diagnostic Agents     Prev CT reports state anaphylaxis with IV contrast, pt also states severe reaction with contrast  . Levofloxacin Other (See Comments)    Face turned red.  . Lipitor [Atorvastatin Calcium] Other (See Comments)    Muscle pain  . Percocet [Oxycodone-Acetaminophen] Swelling  . Sulfa Antibiotics Swelling      Outpatient Medications Prior to Visit  Medication Sig Dispense Refill  . albuterol (VENTOLIN HFA) 108 (90 Base) MCG/ACT inhaler INHALE TWO PUFFS BY MOUTH EVERY 4 HOURS AS NEEDED FOR WHEEZING OR SHORTNESS OF BREATH 18 each 3  . aspirin EC 81 MG tablet Take 81 mg by mouth daily.    . budesonide-formoterol (SYMBICORT) 160-4.5 MCG/ACT inhaler Inhale 2 puffs into the lungs 2 (two) times daily. 1 Inhaler 11  . clobetasol cream (TEMOVATE) 0.05 % Apply nightly at bedtime for x 1 week, Week 2 apply twice a weekly, third  week and thereafter apply once a week external genitalia 30 g 2  . dicyclomine (BENTYL) 20 MG tablet TAKE ONE TABLET BY MOUTH EVERY 6 HOURS 40 tablet 4  . EPINEPHrine 0.3 mg/0.3 mL IJ SOAJ injection Inject 0.3 mLs (0.3 mg total) into the muscle once. 1 Device 5  . famotidine (PEPCID) 20 MG tablet Take 20 mg by mouth 2 (two) times daily.    . fluticasone (FLONASE) 50 MCG/ACT nasal spray USE TWO SPRAY IN EACH NOSTRIL EVERY DAY 16 g 4  . ibuprofen (ADVIL,MOTRIN) 800 MG tablet Take 800 mg by  mouth 1 day or 1 dose.    . metFORMIN (GLUCOPHAGE-XR) 500 MG 24 hr tablet Take 2 tablets (1,000 mg total) by mouth 2 (two) times daily. 360 tablet 3  . ALPRAZolam (XANAX) 0.25 MG tablet TAKE ONE-HALF TO ONE TABLET BY MOUTH AS NEEDED STRESS 20 tablet 3  . telmisartan (MICARDIS) 40 MG tablet Take 1 tablet (40 mg total) by mouth daily. 30 tablet 0   No facility-administered medications prior to visit.       Past Surgical History:  Procedure Laterality Date  . APPENDECTOMY    . BREAST SURGERY    . NECK SURGERY     Disk and rod  . TUBAL LIGATION        Past Medical History:  Diagnosis Date  . Allergy   . Anxiety   . Asthma   . Diabetes mellitus without complication ([place])   . Pulmonary embolism ([place]) At age 58    With associated DVT      Review of Systems  Constitutional: Negative for chills, fatigue and fever.  Eyes:       Denies changes in vision  Respiratory: Negative for cough, chest tightness, shortness of breath and wheezing.   Cardiovascular: Negative for chest pain, palpitations and leg swelling.  Endocrine: Negative for polydipsia, polyphagia and polyuria.  Neurological: Negative for dizziness, weakness, light-headedness and numbness.      Objective:    BP 138/82 (BP Location: Left Arm, Patient Position: Sitting, Cuff Size: Large)   Pulse 65   Temp 98.2 F (36.8 C) (Oral)   Resp 16   Ht 5\' 3"  (1.6 m)   Wt 181 lb (82.1 kg)   SpO2 98%   BMI 32.06 kg/m  Nursing note and vital signs reviewed.  Physical Exam  Constitutional: She is oriented to person, place, and time. She appears well-developed and well-nourished. No distress.  Cardiovascular: Normal rate, regular rhythm and intact distal pulses.  Exam reveals no gallop and no friction rub.   No murmur heard. Pulmonary/Chest: Effort normal and breath sounds normal. No respiratory distress. She has no wheezes. She has no rales. She exhibits no tenderness.  Neurological: She is alert and oriented to person,  place, and time.  Skin: Skin is warm and dry.  Psychiatric: She has a normal mood and affect. Her behavior is normal. Judgment and thought content normal.       Assessment & Plan:   Problem List Items Addressed This Visit      Cardiovascular and Mediastinum   Hypertension    Blood pressure remains well-controlled and below goal 140/90 with current medication regimen and no adverse side effects. Denies worst headache of life with no symptoms of end organ damage noted on physical exam. Continue current dosage of Micardis. Monitor blood pressure at home and follow low-sodium diet. Continue to monitor.      Relevant Medications   telmisartan (MICARDIS) 40 MG tablet  Endocrine   Diabetes mellitus ([place]) - Primary    Type 2 diabetes appears stable with current medication regimen and no adverse side effects or hypoglycemic readings. Maintained on telmisartan for CAD risk reduction. Diabetic foot exam is up to date. Due for diabetic eye exam encouraged to be completed independently. Continue current dosage of metformin. Continue to monitor blood sugars 1-2 times per day.       Relevant Medications   telmisartan (MICARDIS) 40 MG tablet     Other   Generalized anxiety disorder    Anxiety appears likely controlled current medication regimen and no adverse side effects. Emphasized importance of stress and stress management. [place] controlled substance database reviewed with no irregularities. Continue current dosage of alprazolam. Continue to monitor.      Class 1 obesity due to excess calories with serious comorbidity and body mass index (BMI) of 32.0 to 32.9 in adult    BMI of 32 with most recent weight gain of approximately 11 pounds. Start calorie tracking to determine current eating patterns. Continue current physical activity with goal of 30 minutes of moderate level activity most days a week. Encourage weight loss of 5-10% of current body weight through nutrition and physical  activity. Does qualify for weight loss medications with a BMI of 32 and multiple comorbid conditions. Follow-up in one month or sooner with dietary log.          I have changed Ms. Langlais's ALPRAZolam. I am also having her maintain her EPINEPHrine, fluticasone, dicyclomine, budesonide-formoterol, aspirin EC, famotidine, ibuprofen, metFORMIN, albuterol, clobetasol cream, and telmisartan.   Meds ordered this encounter  Medications  . telmisartan (MICARDIS) 40 MG tablet    Sig: Take 1 tablet (40 mg total) by mouth daily.    Dispense:  90 tablet    Refill:  1    Order Specific Question:   Supervising Provider    Answer:   Pricilla Holm A [[id number]]  . ALPRAZolam (XANAX) 0.25 MG tablet    Sig: Take 0.5-1 tablets (0.125-0.25 mg total) by mouth daily as needed for anxiety.    Dispense:  30 tablet    Refill:  1    Order Specific Question:   Supervising Provider    Answer:   Pricilla Holm A [[id number]]     Follow-up: Return in about 1 month (around 10/27/2016), or if symptoms worsen or fail to improve.  Mauricio Po, FNP

## 2016-09-26 NOTE — Patient Instructions (Addendum)
Thank you for choosing [facility].  SUMMARY AND INSTRUCTIONS:  Keep track of your caloric intake with the recommended goal of 1200-1500 calories per day. Use MyFitnessPal for calorie tracking.  Continue physical activity.    Continue to monitor your blood pressure and blood sugars at home.   Medication:  Continue to take your medications as prescribed.    Your prescription(s) have been submitted to your pharmacy or been printed and provided for you. Please take as directed and contact our office if you believe you are having problem(s) with the medication(s) or have any questions.  Labs:  Please stop by the lab on the lower level of the building for your blood work. Your results will be released to [place] (or called to you) after review, usually within 72 hours after test completion. If any changes need to be made, you will be notified at that same time.  1.) The lab is open from 7:30am to 5:30 pm Monday-Friday 2.) No appointment is necessary 3.) Fasting (if needed) is 6-8 hours after food and drink; black coffee and water are okay   Imaging / Radiology:  Please stop by radiology on the basement level of the building for your x-rays. Your results will be released to [place] (or called to you) after review, usually within 72 hours after test completion. If any treatments or changes are necessary, you will be notified at that same time.  Referrals:  Referrals have been made during this visit. You should expect to hear back from our schedulers in about 7-10 days in regards to establishing an appointment with the specialists we discussed.   Follow up:  If your symptoms worsen or fail to improve, please contact our office for further instruction, or in case of emergency go directly to the emergency room at the closest medical facility.     Calorie Counting for Weight Loss Calories are units of energy. Your body needs a certain amount of calories from food to keep you going  throughout the day. When you eat more calories than your body needs, your body stores the extra calories as fat. When you eat fewer calories than your body needs, your body burns fat to get the energy it needs. Calorie counting means keeping track of how many calories you eat and drink each day. Calorie counting can be helpful if you need to lose weight. If you make sure to eat fewer calories than your body needs, you should lose weight. Ask your health care provider what a healthy weight is for you. For calorie counting to work, you will need to eat the right number of calories in a day in order to lose a healthy amount of weight per week. A dietitian can help you determine how many calories you need in a day and will give you suggestions on how to reach your calorie goal.  A healthy amount of weight to lose per week is usually 1-2 lb (0.5-0.9 kg). This usually means that your daily calorie intake should be reduced by 500-750 calories.  Eating 1,200 - 1,500 calories per day can help most women lose weight.  Eating 1,500 - 1,800 calories per day can help most men lose weight. What is my plan? My goal is to have __________ calories per day. If I have this many calories per day, I should lose around __________ pounds per week. What do I need to know about calorie counting? In order to meet your daily calorie goal, you will need to:  Find  out how many calories are in each food you would like to eat. Try to do this before you eat.  Decide how much of the food you plan to eat.  Write down what you ate and how many calories it had. Doing this is called keeping a food log. To successfully lose weight, it is important to balance calorie counting with a healthy lifestyle that includes regular activity. Aim for 150 minutes of moderate exercise (such as walking) or 75 minutes of vigorous exercise (such as running) each week. Where do I find calorie information?   The number of calories in a food can be  found on a Nutrition Facts label. If a food does not have a Nutrition Facts label, try to look up the calories online or ask your dietitian for help. Remember that calories are listed per serving. If you choose to have more than one serving of a food, you will have to multiply the calories per serving by the amount of servings you plan to eat. For example, the label on a package of bread might say that a serving size is 1 slice and that there are 90 calories in a serving. If you eat 1 slice, you will have eaten 90 calories. If you eat 2 slices, you will have eaten 180 calories. How do I keep a food log? Immediately after each meal, record the following information in your food log:  What you ate. Don't forget to include toppings, sauces, and other extras on the food.  How much you ate. This can be measured in cups, ounces, or number of items.  How many calories each food and drink had.  The total number of calories in the meal. Keep your food log near you, such as in a small notebook in your pocket, or use a mobile app or website. Some programs will calculate calories for you and show you how many calories you have left for the day to meet your goal. What are some calorie counting tips?  Use your calories on foods and drinks that will fill you up and not leave you hungry:  Some examples of foods that fill you up are nuts and nut butters, vegetables, lean proteins, and high-fiber foods like whole grains. High-fiber foods are foods with more than 5 g fiber per serving.  Drinks such as sodas, specialty coffee drinks, alcohol, and juices have a lot of calories, yet do not fill you up.  Eat nutritious foods and avoid empty calories. Empty calories are calories you get from foods or beverages that do not have many vitamins or protein, such as candy, sweets, and soda. It is better to have a nutritious high-calorie food (such as an avocado) than a food with few nutrients (such as a bag of  chips).  Know how many calories are in the foods you eat most often. This will help you calculate calorie counts faster.  Pay attention to calories in drinks. Low-calorie drinks include water and unsweetened drinks.  Pay attention to nutrition labels for "low fat" or "fat free" foods. These foods sometimes have the same amount of calories or more calories than the full fat versions. They also often have added sugar, starch, or salt, to make up for flavor that was removed with the fat.  Find a way of tracking calories that works for you. Get creative. Try different apps or programs if writing down calories does not work for you. What are some portion control tips?  Know how many calories  are in a serving. This will help you know how many servings of a certain food you can have.  Use a measuring cup to measure serving sizes. You could also try weighing out portions on a kitchen scale. With time, you will be able to estimate serving sizes for some foods.  Take some time to put servings of different foods on your favorite plates, bowls, and cups so you know what a serving looks like.  Try not to eat straight from a bag or box. Doing this can lead to overeating. Put the amount you would like to eat in a cup or on a plate to make sure you are eating the right portion.  Use smaller plates, glasses, and bowls to prevent overeating.  Try not to multitask (for example, watch TV or use your computer) while eating. If it is time to eat, sit down at a table and enjoy your food. This will help you to know when you are full. It will also help you to be aware of what you are eating and how much you are eating. What are tips for following this plan? Reading food labels   Check the calorie count compared to the serving size. The serving size may be smaller than what you are used to eating.  Check the source of the calories. Make sure the food you are eating is high in vitamins and protein and low in  saturated and trans fats. Shopping   Read nutrition labels while you shop. This will help you make healthy decisions before you decide to purchase your food.  Make a grocery list and stick to it. Cooking   Try to cook your favorite foods in a healthier way. For example, try baking instead of frying.  Use low-fat dairy products. Meal planning   Use more fruits and vegetables. Half of your plate should be fruits and vegetables.  Include lean proteins like poultry and fish. How do I count calories when eating out?  Ask for smaller portion sizes.  Consider sharing an entree and sides instead of getting your own entree.  If you get your own entree, eat only half. Ask for a box at the beginning of your meal and put the rest of your entree in it so you are not tempted to eat it.  If calories are listed on the menu, choose the lower calorie options.  Choose dishes that include vegetables, fruits, whole grains, low-fat dairy products, and lean protein.  Choose items that are boiled, broiled, grilled, or steamed. Stay away from items that are buttered, battered, fried, or served with cream sauce. Items labeled "crispy" are usually fried, unless stated otherwise.  Choose water, low-fat milk, unsweetened iced tea, or other drinks without added sugar. If you want an alcoholic beverage, choose a lower calorie option such as a glass of wine or light beer.  Ask for dressings, sauces, and syrups on the side. These are usually high in calories, so you should limit the amount you eat.  If you want a salad, choose a garden salad and ask for grilled meats. Avoid extra toppings like bacon, cheese, or fried items. Ask for the dressing on the side, or ask for olive oil and vinegar or lemon to use as dressing.  Estimate how many servings of a food you are given. For example, a serving of cooked rice is  cup or about the size of half a baseball. Knowing serving sizes will help you be aware of how much  food you are eating at restaurants. The list below tells you how big or small some common portion sizes are based on everyday objects:  1 oz-4 stacked dice.  3 oz-1 deck of cards.  1 tsp-1 die.  1 Tbsp- a ping-pong ball.  2 Tbsp-1 ping-pong ball.   cup- baseball.  1 cup-1 baseball. Summary  Calorie counting means keeping track of how many calories you eat and drink each day. If you eat fewer calories than your body needs, you should lose weight.  A healthy amount of weight to lose per week is usually 1-2 lb (0.5-0.9 kg). This usually means reducing your daily calorie intake by 500-750 calories.  The number of calories in a food can be found on a Nutrition Facts label. If a food does not have a Nutrition Facts label, try to look up the calories online or ask your dietitian for help.  Use your calories on foods and drinks that will fill you up, and not on foods and drinks that will leave you hungry.  Use smaller plates, glasses, and bowls to prevent overeating. This information is not intended to replace advice given to you by your health care provider. Make sure you discuss any questions you have with your health care provider. Document Released: 07/03/2005 Document Revised: 06/02/2016 Document Reviewed: 06/02/2016 Elsevier Interactive Patient Education  2017 [employer].

## 2016-09-26 NOTE — Assessment & Plan Note (Signed)
Type 2 diabetes appears stable with current medication regimen and no adverse side effects or hypoglycemic readings. Maintained on telmisartan for CAD risk reduction. Diabetic foot exam is up to date. Due for diabetic eye exam encouraged to be completed independently. Continue current dosage of metformin. Continue to monitor blood sugars 1-2 times per day.

## 2016-11-09 DIAGNOSIS — Z6830 Body mass index (BMI) 30.0-30.9, adult: Secondary | ICD-10-CM | POA: Diagnosis not present

## 2016-11-09 DIAGNOSIS — E782 Mixed hyperlipidemia: Secondary | ICD-10-CM | POA: Diagnosis not present

## 2016-11-09 DIAGNOSIS — Z789 Other specified health status: Secondary | ICD-10-CM | POA: Diagnosis not present

## 2016-11-09 DIAGNOSIS — I2510 Atherosclerotic heart disease of native coronary artery without angina pectoris: Secondary | ICD-10-CM | POA: Diagnosis not present

## 2016-11-13 ENCOUNTER — Other Ambulatory Visit: Payer: Self-pay | Admitting: Emergency Medicine

## 2016-11-13 DIAGNOSIS — J301 Allergic rhinitis due to pollen: Secondary | ICD-10-CM

## 2016-11-16 DIAGNOSIS — E119 Type 2 diabetes mellitus without complications: Secondary | ICD-10-CM | POA: Diagnosis not present

## 2016-11-16 DIAGNOSIS — H25013 Cortical age-related cataract, bilateral: Secondary | ICD-10-CM | POA: Diagnosis not present

## 2016-11-16 LAB — HM DIABETES EYE EXAM

## 2016-11-17 ENCOUNTER — Other Ambulatory Visit (INDEPENDENT_AMBULATORY_CARE_PROVIDER_SITE_OTHER): Payer: Medicare Other

## 2016-11-17 DIAGNOSIS — M810 Age-related osteoporosis without current pathological fracture: Secondary | ICD-10-CM | POA: Diagnosis not present

## 2016-11-17 DIAGNOSIS — E559 Vitamin D deficiency, unspecified: Secondary | ICD-10-CM

## 2016-11-17 LAB — BASIC METABOLIC PANEL
BUN: 27 mg/dL — AB (ref 6–23)
CO2: 23 mEq/L (ref 19–32)
Calcium: 9.5 mg/dL (ref 8.4–10.5)
Chloride: 107 mEq/L (ref 96–112)
Creatinine, Ser: 1.12 mg/dL (ref 0.40–1.20)
GFR: 50.98 mL/min — ABNORMAL LOW (ref >60.00)
GLUCOSE: 143 mg/dL — AB (ref 70–99)
POTASSIUM: 4.3 meq/L (ref 3.5–5.1)
Sodium: 138 mEq/L (ref 135–145)

## 2016-11-17 LAB — VITAMIN D 25 HYDROXY (VIT D DEFICIENCY, FRACTURES): VITD: 36.32 ng/mL (ref 30.00–100.00)

## 2016-11-21 NOTE — Progress Notes (Signed)
Patient ID: Alison Friedman, female   DOB: [birth year], 71 y.o.   MRN: [id number]           Chief complaint: Osteoporosis   History of Present Illness:  Alison Friedman had been referred in 2017 for management of osteoporosis  Initial history as follows: Alison Friedman has lost about 1 inch in height over the years Alison Friedman had periodic screening bone density measurements done and these have showed the following T-scores on the last measurement in 3/17: Femoral neck: Measurements  -2.6 and -2.5 Spine: -2.1 FRAX fracture risk at the hip is not calculated    Apparently her bone density has declined 7-10% at various sites  From prior measurements  Alison Friedman has no history of low trauma fracture or height loss  Age at menopause: 40-42 years.  Alison Friedman took hormone replacement only for a few months and has not had a hysterectomy Other risk factors: Prior history of smoking, Alison Friedman stopped in 2008 Positive family history of osteoporosis including her sister, mother and grandmother  Previous treatment: None Calcium supplements: None, Alison Friedman has intolerance to these because of constipation even if using calcium citrate or chewable calcium.  Alison Friedman does have some dairy products like yogurt and cheese and her diet and recently more milk. Exercise: Alison Friedman tries to walk as much as possible especially at her work  RECENT history: Current treatment: None Vitamin D supplements: D3, 2000 units daily  Alison Friedman was prescribed Actonel 150 mg weekly in 5/17 Alison Friedman said that after Alison Friedman took this Alison Friedman had chest discomfort and flulike symptoms as well as diarrhea for 4-5 days Alison Friedman did not report that Alison Friedman had side effects and is coming now for follow-up   LABS:  Vitamin D level in 2015 was 31  Lab Results  Component Value Date   VD25OH 36.32 11/17/2016   VD25OH 18.22 (L) 11/24/2015     Past Medical History:  Diagnosis Date  . Allergy   . Anxiety   . Asthma   . Diabetes mellitus without complication ([place])   . Pulmonary embolism ([place]) At age 67    With associated DVT    Past Surgical History:  Procedure Laterality Date  . APPENDECTOMY    . BREAST SURGERY    . NECK SURGERY     Disk and rod  . TUBAL LIGATION      Family History  Problem Relation Age of Onset  . Osteoporosis Mother   . Osteoporosis Sister   . Breast cancer Daughter 53  . Dementia Maternal Grandmother   . Stroke Maternal Grandfather     Social History:  reports that Alison Friedman quit smoking about 21 months ago. Her smoking use included Cigarettes. Alison Friedman has a 35.00 pack-year smoking history. Alison Friedman has quit using smokeless tobacco. Alison Friedman reports that Alison Friedman does not drink alcohol or use drugs.  Allergies:  Allergies  Allergen Reactions  . Nitrofurantoin Monohyd Macro Anaphylaxis  . Penicillins Anaphylaxis  . Tuna [Fish Allergy] Anaphylaxis  . Clarithromycin Other (See Comments)    Face turns red.  . Crestor [Rosuvastatin]   . Iodinated Diagnostic Agents     Prev CT reports state anaphylaxis with IV contrast, pt also states severe reaction with contrast  . Levofloxacin Other (See Comments)    Face turned red.  . Lipitor [Atorvastatin Calcium] Other (See Comments)    Muscle pain  . Percocet [Oxycodone-Acetaminophen] Swelling  . Sulfa Antibiotics Swelling    Allergies as of 11/22/2016      Reactions   Nitrofurantoin Monohyd Macro Anaphylaxis  Penicillins Anaphylaxis   Blain Pais Allergy] Anaphylaxis   Clarithromycin Other (See Comments)   Face turns red.   Crestor [rosuvastatin]    Iodinated Diagnostic Agents    Prev CT reports state anaphylaxis with IV contrast, pt also states severe reaction with contrast   Levofloxacin Other (See Comments)   Face turned red.   Lipitor [atorvastatin Calcium] Other (See Comments)   Muscle pain   Percocet [oxycodone-acetaminophen] Swelling   Sulfa Antibiotics Swelling      Medication List       Accurate as of 11/22/16  8:24 AM. Always use your most recent med list.          albuterol 108 (90 Base) MCG/ACT  inhaler Commonly known as:  VENTOLIN HFA INHALE TWO PUFFS BY MOUTH EVERY 4 HOURS AS NEEDED FOR WHEEZING OR SHORTNESS OF BREATH   ALPRAZolam 0.25 MG tablet Commonly known as:  XANAX Take 0.5-1 tablets (0.125-0.25 mg total) by mouth daily as needed for anxiety.   aspirin EC 81 MG tablet Take 81 mg by mouth daily.   chlorpheniramine 4 MG tablet Commonly known as:  CHLOR-TRIMETON Take 4 mg by mouth 2 (two) times daily as needed for allergies.   clobetasol cream 0.05 % Commonly known as:  TEMOVATE Apply nightly at bedtime for x 1 week, Week 2 apply twice a weekly, third week and thereafter apply once a week external genitalia   dicyclomine 20 MG tablet Commonly known as:  BENTYL TAKE ONE TABLET BY MOUTH EVERY 6 HOURS   EPINEPHrine 0.3 mg/0.3 mL Soaj injection Commonly known as:  EPI-PEN Inject 0.3 mLs (0.3 mg total) into the muscle once.   famotidine 20 MG tablet Commonly known as:  PEPCID Take 20 mg by mouth 2 (two) times daily.   fluticasone 50 MCG/ACT nasal spray Commonly known as:  FLONASE USE TWO SPRAY(S) IN EACH NOSTRIL ONCE DAILY   ibuprofen 800 MG tablet Commonly known as:  ADVIL,MOTRIN Take 800 mg by mouth 1 day or 1 dose.   metFORMIN 500 MG 24 hr tablet Commonly known as:  GLUCOPHAGE-XR Take 2 tablets (1,000 mg total) by mouth 2 (two) times daily.   telmisartan 40 MG tablet Commonly known as:  MICARDIS Take 1 tablet (40 mg total) by mouth daily.        Review of Systems   Alison Friedman has type 2 diabetes diagnosed in 2003.   Currently on metformin 1000 mg ER twice a day   Alison Friedman checks her blood sugars: Fasting are about 120, pc up to 178  Has not had any A1c from PCP recently Alison Friedman thinks Alison Friedman is gaining weight because of stopping smoking  Lab Results  Component Value Date   HGBA1C 6.9 (H) 07/21/2016   HGBA1C 6.8 02/04/2016   HGBA1C 7.0 10/14/2015   Lab Results  Component Value Date   MICROALBUR 0.2 07/01/2015   LDLCALC 93 07/01/2015   CREATININE 1.12  11/17/2016   Previous thyroid levels:  Lab Results  Component Value Date   TSH 4.17 11/24/2015   TSH 3.147 05/09/2011   FREET4 0.81 11/24/2015     LABS:  Lab on 11/17/2016  Component Date Value Ref Range Status  . VITD 11/17/2016 36.32  30.00 - 100.00 ng/mL Final  . Sodium 11/17/2016 138  135 - 145 mEq/L Final  . Potassium 11/17/2016 4.3  3.5 - 5.1 mEq/L Final  . Chloride 11/17/2016 107  96 - 112 mEq/L Final  . CO2 11/17/2016 23  19 - 32 mEq/L Final  .  Glucose, Bld 11/17/2016 143* 70 - 99 mg/dL Final  . BUN 11/17/2016 27* 6 - 23 mg/dL Final  . Creatinine, Ser 11/17/2016 1.12  0.40 - 1.20 mg/dL Final  . Calcium 11/17/2016 9.5  8.4 - 10.5 mg/dL Final  . 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 neck  Currently untreated and Alison Friedman was concerned about the cost of Prolia Vitamin D is sufficient currently  Diabetes: Alison Friedman has a BMI of 32 and an A1c of 6.9  Alison Friedman reports fairly good blood sugars at home, this was 143 fasting in the lab Alison Friedman probably can do better with diet and exercise regimen as well as increased efforts to lose weight as Alison Friedman is already on maximum dose of metformin   PLAN:    Alison Friedman agrees to retry Actonel at the lower dose of 35 mg weekly, this should be adequate treatment for her asymptomatic mild osteoporosis  Continue vitamin D supplement  Consultation with dietitian  Encouraged her to check more readings after meals  Follow-up with PCP in couple of months for another A1c and Alison Friedman will be seen here in 6 months  Wenda Vanschaick 11/22/2016, 8:24 AM

## 2016-11-22 ENCOUNTER — Encounter: Payer: Self-pay | Admitting: Endocrinology

## 2016-11-22 ENCOUNTER — Ambulatory Visit (INDEPENDENT_AMBULATORY_CARE_PROVIDER_SITE_OTHER): Payer: Medicare Other | Admitting: Endocrinology

## 2016-11-22 VITALS — BP 120/78 | HR 97 | Ht 63.0 in | Wt 180.8 lb

## 2016-11-22 DIAGNOSIS — E559 Vitamin D deficiency, unspecified: Secondary | ICD-10-CM | POA: Diagnosis not present

## 2016-11-22 DIAGNOSIS — M810 Age-related osteoporosis without current pathological fracture: Secondary | ICD-10-CM | POA: Diagnosis not present

## 2016-11-22 DIAGNOSIS — E1165 Type 2 diabetes mellitus with hyperglycemia: Secondary | ICD-10-CM | POA: Diagnosis not present

## 2016-11-22 MED ORDER — RISEDRONATE SODIUM 35 MG PO TABS: 35.0000 mg | ORAL_TABLET | ORAL | 3 refills | Status: DC

## 2016-11-29 ENCOUNTER — Encounter: Payer: Self-pay | Admitting: Gynecology

## 2016-12-05 ENCOUNTER — Encounter: Payer: Self-pay | Admitting: Endocrinology

## 2016-12-21 ENCOUNTER — Encounter: Payer: Medicare Other | Attending: Endocrinology | Admitting: Dietician

## 2016-12-21 ENCOUNTER — Encounter: Payer: Self-pay | Admitting: Dietician

## 2016-12-21 DIAGNOSIS — Z713 Dietary counseling and surveillance: Secondary | ICD-10-CM | POA: Insufficient documentation

## 2016-12-21 DIAGNOSIS — E1165 Type 2 diabetes mellitus with hyperglycemia: Secondary | ICD-10-CM | POA: Insufficient documentation

## 2016-12-21 DIAGNOSIS — E119 Type 2 diabetes mellitus without complications: Secondary | ICD-10-CM

## 2016-12-21 NOTE — Patient Instructions (Signed)
Breakfast, lunch, dinner daily. Avoid skipping meals. Small meals are fine. Aim for foods that have the most nutrition. Increase your intake of non starchy vegetables as tolerated.  Consider stopping smoking. Consider decreasing your diet coke.  Drink more water. Continue to stay active.  Aim for 2-3 Carb Choices per meal (30-45 grams) +/- 1 either way  Aim for 0-1 Carbs per snack if hungry  Include protein in moderation with your meals and snacks Consider reading food labels for Total Carbohydrate and Fat Grams of foods Consider checking BG at alternate times per day as directed by MD  Consider taking medication as directed by MD

## 2016-12-21 NOTE — Progress Notes (Signed)
Diabetes Self-Management Education  Visit Type: First/Initial  Appt. Start Time: 1310 Appt. End Time: 1430  12/21/2016  [name], identified by name and date of birth, is a 71 y.o. female with a diagnosis of Diabetes: Type 2. Other hx includes osteoporosis, HTN, anxiety, smoking (She stopped many times but concerned about weight gain.  She now smokes 1 cigarette each am.).  She also has severe constipation and goes 10 days between bowel movements at times.  She states that she has  A kink in her bowel, and an enlarged colon. She reports that honey nut cheerios is one of the few foods that cause her to have a bowel movement.  She does not tolerate broccoli and brussels sprouts as they cause severe gas. She cannot tolerate pharmaceutical fiber.  She is allergic to many medications, shellfish, and fish.  Patient lives with her husband.  They eat out frequently and their 28 yo grandson eats at their home twice per week.  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  Her mother and father in law are in other nursing homes in other local towns as well.  ASSESSMENT  Height 5\' 3"  (1.6 m), weight 177 lb (80.3 kg). Body mass index is 31.35 kg/m.      Diabetes Self-Management Education - 12/21/16 1344      Visit Information   Visit Type First/Initial     Initial Visit   Diabetes Type Type 2   Are you currently following a meal plan? No   Are you taking your medications as prescribed? No  metformin yes some others are inconsistent   Date Diagnosed 2005     Health Coping   How would you rate your overall health? Good     Psychosocial Assessment   Patient Belief/Attitude about Diabetes Other (comment)  frustrated by health in general   Self-care barriers None   Self-management support Doctor's office   Other persons present Patient   Patient Concerns Weight Control;Glycemic Control;Nutrition/Meal planning   Special Needs None   Preferred Learning Style No preference  indicated   Learning Readiness Ready   How often do you need to have someone help you when you read instructions, pamphlets, or other written materials from your doctor or pharmacy? 1 - Never   What is the last grade level you completed in school? 4 years college     Pre-Education Assessment   Patient understands the diabetes disease and treatment process. Demonstrates understanding / competency   Patient understands incorporating nutritional management into lifestyle. Needs Review   Patient undertands incorporating physical activity into lifestyle. Demonstrates understanding / competency   Patient understands using medications safely. Demonstrates understanding / competency   Patient understands monitoring blood glucose, interpreting and using results Needs Review   Patient understands prevention, detection, and treatment of acute complications. Demonstrates understanding / competency   Patient understands prevention, detection, and treatment of chronic complications. Demonstrates understanding / competency   Patient understands how to develop strategies to address psychosocial issues. Needs Review   Patient understands how to develop strategies to promote health/change behavior. Needs Review     Complications   Last HgB A1C per patient/outside source 6.9 %  07/21/16   How often do you check your blood sugar? 0 times/day (not testing)   Fasting Blood glucose range (mg/dL) 180-200;130-179;70-129  3 weeks ago   Number of hypoglycemic episodes per month 0   Number of hyperglycemic episodes per week 0  Have you had a dilated eye exam in the past 12 months? Yes   Have you had a dental exam in the past 12 months? Yes   Are you checking your feet? Yes   How many days per week are you checking your feet? 4     Dietary Intake   Breakfast 2-3 cups black coffee and SKIPS and occasional scrambled eggs and cheese OR [place] or Honey nut cheerios   Snack (morning) none   Lunch salad with cottage  cheese, bluecheese dressing, meat, vegetables OR can of peaches or pears OR cheese burger at home   Dinner Out to eat ([name]'s salad with biue cheese and cheese pizza and wine), (Blue Agave Taco salad without the shell, chips and queso), (Herbies chef salad) OR cooks Medical sales representative and [place] OR pork loin, mashed potatoes and green peas OR meat loaf and macaroni and cheese OR Stamey's hot dog with slaw and chili   Snack (evening) cheese, crackers, 1/2 glass wine OR a ciggarette.   Beverage(s) water, coffee (black-4-5 cups daily), 32-40 ounces diet coke daily, water, 1 cup 2% milk daily,      Exercise   Exercise Type Light (walking / raking leaves)  walks at work 30 minutes per day, walks 1/2 mile each afternoon most days.  Armchair exercises.   How many days per week to you exercise? 6   How many minutes per day do you exercise? 45   Total minutes per week of exercise 270     Patient Education   Previous Diabetes Education Yes (please comment)  multiple times   Nutrition management  Role of diet in the treatment of diabetes and the relationship between the three main macronutrients and blood glucose level;Food label reading, portion sizes and measuring food.;Meal options for control of blood glucose level and chronic complications.   Physical activity and exercise  Role of exercise on diabetes management, blood pressure control and cardiac health.   Monitoring Purpose and frequency of SMBG.;Yearly dilated eye exam;Daily foot exams   Chronic complications Relationship between chronic complications and blood glucose control   Psychosocial adjustment Worked with patient to identify barriers to care and solutions   Personal strategies to promote health Review risk of smoking and offered smoking cessation     Individualized Goals (developed by patient)   Nutrition General guidelines for healthy choices and portions discussed   Physical Activity Exercise 5-7 days per week;30 minutes per day    Medications take my medication as prescribed   Problem Solving regular meal schedule and create consistent bowel schedule   Reducing Risk stop smoking   Health Coping discuss diabetes with (comment)  RD/MD     Post-Education Assessment   Patient understands the diabetes disease and treatment process. Demonstrates understanding / competency   Patient understands incorporating nutritional management into lifestyle. Demonstrates understanding / competency   Patient undertands incorporating physical activity into lifestyle. Demonstrates understanding / competency   Patient understands using medications safely. Demonstrates understanding / competency   Patient understands monitoring blood glucose, interpreting and using results Demonstrates understanding / competency   Patient understands prevention, detection, and treatment of acute complications. Demonstrates understanding / competency   Patient understands prevention, detection, and treatment of chronic complications. Demonstrates understanding / competency   Patient understands how to develop strategies to address psychosocial issues. Demonstrates understanding / competency   Patient understands how to develop strategies to promote health/change behavior. Demonstrates understanding / competency     Outcomes   Expected Outcomes Other (  comment)  demonstrated interest but questions change   Future DMSE PRN   Program Status Completed      Individualized Plan for Diabetes Self-Management Training:   Learning Objective:  Patient will have a greater understanding of diabetes self-management. Patient education plan is to attend individual and/or group sessions per assessed needs and concerns.   Plan:   Patient Instructions  Breakfast, lunch, dinner daily. Avoid skipping meals. Small meals are fine. Aim for foods that have the most nutrition. Increase your intake of non starchy vegetables as tolerated.  Consider stopping  smoking. Consider decreasing your diet coke.  Drink more water. Continue to stay active.  Aim for 2-3 Carb Choices per meal (30-45 grams) +/- 1 either way  Aim for 0-1 Carbs per snack if hungry  Include protein in moderation with your meals and snacks Consider reading food labels for Total Carbohydrate and Fat Grams of foods Consider checking BG at alternate times per day as directed by MD  Consider taking medication as directed by MD        Expected Outcomes:  Other (comment) (demonstrated interest but questions change)  Education material provided: Food label handouts, A1C conversion sheet, Meal plan card, My Plate and Snack sheet  If problems or questions, patient to contact team via:  Phone  Future DSME appointment: PRN

## 2016-12-25 ENCOUNTER — Other Ambulatory Visit: Payer: Self-pay | Admitting: Emergency Medicine

## 2016-12-25 NOTE — Telephone Encounter (Signed)
Has established with FNP [facility].  Will forward to [place] for approval.

## 2017-02-01 DIAGNOSIS — E782 Mixed hyperlipidemia: Secondary | ICD-10-CM | POA: Diagnosis not present

## 2017-02-08 DIAGNOSIS — Z789 Other specified health status: Secondary | ICD-10-CM | POA: Diagnosis not present

## 2017-02-08 DIAGNOSIS — E782 Mixed hyperlipidemia: Secondary | ICD-10-CM | POA: Diagnosis not present

## 2017-02-08 DIAGNOSIS — Z6830 Body mass index (BMI) 30.0-30.9, adult: Secondary | ICD-10-CM | POA: Diagnosis not present

## 2017-02-08 DIAGNOSIS — I2510 Atherosclerotic heart disease of native coronary artery without angina pectoris: Secondary | ICD-10-CM | POA: Diagnosis not present

## 2017-03-12 DIAGNOSIS — Z1231 Encounter for screening mammogram for malignant neoplasm of breast: Secondary | ICD-10-CM | POA: Diagnosis not present

## 2017-03-12 LAB — HM MAMMOGRAPHY

## 2017-03-14 ENCOUNTER — Encounter: Payer: Self-pay | Admitting: Family

## 2017-04-05 ENCOUNTER — Encounter: Payer: Self-pay | Admitting: Physician Assistant

## 2017-04-05 ENCOUNTER — Ambulatory Visit (INDEPENDENT_AMBULATORY_CARE_PROVIDER_SITE_OTHER): Payer: Medicare Other | Admitting: Physician Assistant

## 2017-04-05 VITALS — BP 109/65 | HR 60 | Temp 98.5°F | Resp 18 | Ht 63.0 in | Wt 176.6 lb

## 2017-04-05 DIAGNOSIS — R300 Dysuria: Secondary | ICD-10-CM

## 2017-04-05 LAB — POCT UA - MICROSCOPIC ONLY

## 2017-04-05 LAB — POCT URINALYSIS DIPSTICK
BILIRUBIN UA: NEGATIVE
Blood, UA: NEGATIVE
Color, UA: NEGATIVE
GLUCOSE UA: NEGATIVE
KETONES UA: NEGATIVE
Nitrite, UA: NEGATIVE
PROTEIN UA: NEGATIVE
Spec Grav, UA: 1.025 (ref 1.010–1.025)
Urobilinogen, UA: 0.2 E.U./dL
pH, UA: 5 (ref 5.0–8.0)

## 2017-04-05 MED ORDER — CEPHALEXIN 500 MG PO CAPS: 500.0000 mg | ORAL_CAPSULE | Freq: Three times a day (TID) | ORAL | 1 refills | Status: AC

## 2017-04-05 NOTE — Progress Notes (Signed)
04/05/2017 4:57 PM   DOB: [birth year] / MRN: [id number]  SUBJECTIVE:  Alison Friedman is a 71 y.o. female presenting for dysuria that started 4 days.  Had some nausea last night.  Associates malaise.  Has tried two keflex and this seemed to help.  Describes the pain as a burning and has been having some frequency.  Also has pressure in the upper abdomen.  No fever or flank pain.   She is allergic to nitrofurantoin monohyd macro; penicillins; shellfish allergy; tuna [fish allergy]; clarithromycin; crestor [rosuvastatin]; iodinated diagnostic agents; levofloxacin; lipitor [atorvastatin calcium]; percocet [oxycodone-acetaminophen]; and sulfa antibiotics.   She  has a past medical history of Allergy; Anxiety; Asthma; Diabetes mellitus without complication ([place]); and Pulmonary embolism (HCC) (At age 60 ).    She  reports that she has been smoking Cigarettes.  She has a 8.75 pack-year smoking history. She has quit using smokeless tobacco. She reports that she does not drink alcohol or use drugs. She  reports that she currently engages in sexual activity. She reports using the following method of birth control/protection: None. The patient  has a past surgical history that includes Appendectomy; Tubal ligation; Breast surgery; and Neck surgery.  Her family history includes Breast cancer (age of onset: 41) in her daughter; Dementia in her maternal grandmother; Osteoporosis in her mother and sister; Stroke in her maternal grandfather.  Review of Systems  Constitutional: Negative for chills, diaphoresis and fever.  Eyes: Negative.   Respiratory: Negative for cough, hemoptysis, sputum production, shortness of breath and wheezing.   Cardiovascular: Negative for chest pain, orthopnea and leg swelling.  Gastrointestinal: Negative for nausea.  Skin: Negative for rash.  Neurological: Negative for dizziness, sensory change, speech change, focal weakness and headaches.    The problem list and medications were  reviewed and updated by myself where necessary and exist elsewhere in the encounter.   OBJECTIVE:  BP 109/65 (BP Location: Right Arm, Patient Position: Sitting, Cuff Size: Large)   Pulse 60   Temp 98.5 F (36.9 C) (Oral)   Resp 18   Ht 5\' 3"  (1.6 m)   Wt 176 lb 9.6 oz (80.1 kg)   SpO2 97%   BMI 31.28 kg/m   Wt Readings from Last 3 Encounters:  04/05/17 176 lb 9.6 oz (80.1 kg)  12/21/16 177 lb (80.3 kg)  11/22/16 180 lb 12.8 oz (82 kg)     Physical Exam  Constitutional: She is active.  Non-toxic appearance.  Cardiovascular: Normal rate.   Pulmonary/Chest: Effort normal. No tachypnea.  Abdominal: There is tenderness in the suprapubic area. There is no CVA tenderness.  Neurological: She is alert.  Skin: Skin is warm and dry. She is not diaphoretic. No pallor.    Lab Results  Component Value Date   HGBA1C 6.9 (H) 07/21/2016   Lab Results  Component Value Date   CREATININE 1.12 11/17/2016     Results for orders placed or performed in visit on 04/05/17 (from the past 72 hour(s))  POCT urinalysis dipstick     Status: Abnormal   Collection Time: 04/05/17 10:18 AM  Result Value Ref Range   Color, UA NEG    Clarity, UA CLEAR    Glucose, UA NEG    Bilirubin, UA NEG    Ketones, UA NEG    Spec Grav, UA 1.025 1.010 - 1.025   Blood, UA NEG    pH, UA 5.0 5.0 - 8.0   Protein, UA NEG    Urobilinogen, UA 0.2  0.2 or 1.0 E.U./dL   Nitrite, UA NEG    Leukocytes, UA Trace (A) Negative  POCT UA - Microscopic Only     Status: None   Collection Time: 04/05/17 10:33 AM  Result Value Ref Range   WBC, Ur, HPF, POC few    RBC, urine, microscopic none    Bacteria, U Microscopic few    Mucus, UA none    Epithelial cells, urine per micros moderate    Crystals, Ur, HPF, POC none    Casts, Ur, LPF, POC none    Yeast, UA none     No results found.  ASSESSMENT AND PLAN:  Josclyn was seen today for dysuria.  Diagnoses and all orders for this visit:  Dysuria: She has taken  keflex, again with relief of symptoms which later returned. Will represcribe. She will call or come back if she is not getting relief.  -     POCT urinalysis dipstick -     POCT UA - Microscopic Only -     Urine Culture  Other orders -     cephALEXin (KEFLEX) 500 MG capsule; Take 1 capsule (500 mg total) by mouth 3 (three) times daily.    The patient is advised to call or return to clinic if she does not see an improvement in symptoms, or to seek the care of the closest emergency department if she worsens with the above plan.   Philis Fendt, MHS, PA-C [facility] 04/05/2017 4:57 PM

## 2017-04-06 ENCOUNTER — Telehealth: Payer: Self-pay | Admitting: General Practice

## 2017-04-06 LAB — URINE CULTURE

## 2017-04-06 NOTE — Telephone Encounter (Signed)
Pt calling to update status: she is not feeling better.

## 2017-04-09 NOTE — Telephone Encounter (Signed)
Spoke with patient no longer having any symptoms

## 2017-04-30 ENCOUNTER — Ambulatory Visit: Payer: Medicare Other | Admitting: Physician Assistant

## 2017-05-03 ENCOUNTER — Ambulatory Visit (INDEPENDENT_AMBULATORY_CARE_PROVIDER_SITE_OTHER): Payer: Medicare Other | Admitting: Physician Assistant

## 2017-05-03 ENCOUNTER — Encounter: Payer: Self-pay | Admitting: Physician Assistant

## 2017-05-03 VITALS — BP 132/76 | HR 72 | Temp 99.0°F | Resp 16 | Ht 63.0 in | Wt 177.0 lb

## 2017-05-03 DIAGNOSIS — J4520 Mild intermittent asthma, uncomplicated: Secondary | ICD-10-CM | POA: Diagnosis not present

## 2017-05-03 DIAGNOSIS — R102 Pelvic and perineal pain: Secondary | ICD-10-CM

## 2017-05-03 DIAGNOSIS — R1030 Lower abdominal pain, unspecified: Secondary | ICD-10-CM

## 2017-05-03 DIAGNOSIS — J3489 Other specified disorders of nose and nasal sinuses: Secondary | ICD-10-CM | POA: Diagnosis not present

## 2017-05-03 DIAGNOSIS — J301 Allergic rhinitis due to pollen: Secondary | ICD-10-CM

## 2017-05-03 LAB — POCT URINALYSIS DIP (MANUAL ENTRY)
BILIRUBIN UA: NEGATIVE
BILIRUBIN UA: NEGATIVE mg/dL
Glucose, UA: NEGATIVE mg/dL
LEUKOCYTES UA: NEGATIVE
Nitrite, UA: NEGATIVE
PH UA: 5.5 (ref 5.0–8.0)
PROTEIN UA: NEGATIVE mg/dL
RBC UA: NEGATIVE
SPEC GRAV UA: 1.025 (ref 1.010–1.025)
Urobilinogen, UA: 0.2 E.U./dL

## 2017-05-03 MED ORDER — OXYBUTYNIN CHLORIDE ER 5 MG PO TB24: 5.0000 mg | ORAL_TABLET | Freq: Every day | ORAL | 3 refills | Status: DC

## 2017-05-03 MED ORDER — FLUTICASONE PROPIONATE 50 MCG/ACT NA SUSP: NASAL | 4 refills | Status: DC

## 2017-05-03 MED ORDER — ALBUTEROL SULFATE HFA 108 (90 BASE) MCG/ACT IN AERS: INHALATION_SPRAY | RESPIRATORY_TRACT | 3 refills | Status: DC

## 2017-05-03 NOTE — Progress Notes (Signed)
05/03/2017 6:50 PM   DOB: September 16, 1945 / MRN: [id number]  SUBJECTIVE:  Alison Friedman is a 71 y.o. female presenting for "an apple in her crouch." She tells me this is mildly painful and describes this as a pressure sensation. Tells me that she has seen GYN and was advised that she did not have a uterine prolapse. Sitting or peeing makes this more noticeable.  She just saw GI in January and some polyps were removed and internal hemorrhoids were noted however the remainder of that exam was normal, as shown below.  I recently treated her for UTI and this has improved her urinary frequency. She tells me that it is normal for her to urinate 3-4 times due to coffee and this once every 3-4 hours for the rest of the day. Gets up at night 2 times a night.  Has some urinary leakage with coughing but this may happen once a month.   Tells me that she needs her inhaler about once every three weeks. No night time cough.  One inhlaer last about 1 year.   She is allergic to nitrofurantoin monohyd macro; penicillins; shellfish allergy; tuna [fish allergy]; clarithromycin; crestor [rosuvastatin]; iodinated diagnostic agents; levofloxacin; lipitor [atorvastatin calcium]; percocet [oxycodone-acetaminophen]; and sulfa antibiotics.   She  has a past medical history of Allergy; Anxiety; Asthma; Diabetes mellitus without complication ([place]); and Pulmonary embolism (HCC) (At age 24 ).    She  reports that she has been smoking Cigarettes.  She has a 8.75 pack-year smoking history. She has quit using smokeless tobacco. She reports that she does not drink alcohol or use drugs. She  reports that she currently engages in sexual activity. She reports using the following method of birth control/protection: None. The patient  has a past surgical history that includes Appendectomy; Tubal ligation; Breast surgery; and Neck surgery.  Her family history includes Breast cancer (age of onset: 75) in her daughter; Dementia in her maternal  grandmother; Osteoporosis in her mother and sister; Stroke in her maternal grandfather.  Review of Systems  Constitutional: Negative for chills and fever.  Gastrointestinal: Negative for nausea.  Skin: Negative for itching and rash.  Neurological: Negative for dizziness.  Psychiatric/Behavioral: Negative for depression.    The problem list and medications were reviewed and updated by myself where necessary and exist elsewhere in the encounter.   OBJECTIVE:  BP 132/76   Pulse 72   Temp 99 F (37.2 C) (Oral)   Resp 16   Ht 5\' 3"  (1.6 m)   Wt 177 lb (80.3 kg)   SpO2 98%   BMI 31.35 kg/m   Physical Exam  Constitutional: She is oriented to person, place, and time. She is active.  Non-toxic appearance.  Cardiovascular: Normal rate, regular rhythm, S1 normal, S2 normal, normal heart sounds and intact distal pulses.  Exam reveals no gallop, no friction rub and no decreased pulses.   No murmur heard. Pulmonary/Chest: Effort normal. No stridor. No tachypnea. No respiratory distress. She has no wheezes. She has no rales.  Abdominal: Soft. Bowel sounds are normal. She exhibits no distension and no mass. There is no hepatosplenomegaly. There is tenderness in the suprapubic area. There is no rigidity, no rebound, no guarding, no CVA tenderness, no tenderness at McBurney's point and negative Murphy's sign.  Musculoskeletal: Normal range of motion. She exhibits no edema.  Neurological: She is alert and oriented to person, place, and time. She has normal reflexes. She displays normal reflexes. No cranial nerve deficit. She  exhibits normal muscle tone. Coordination normal.  Skin: Skin is warm and dry. She is not diaphoretic. No pallor.    Lab Results  Component Value Date   HGBA1C 6.9 (H) 07/21/2016     Results for orders placed or performed in visit on 05/03/17 (from the past 72 hour(s))  POCT urinalysis dipstick     Status: None   Collection Time: 05/03/17  4:47 PM  Result Value Ref  Range   Color, UA yellow yellow   Clarity, UA clear clear   Glucose, UA negative negative mg/dL   Bilirubin, UA negative negative   Ketones, POC UA negative negative mg/dL   Spec Grav, UA 1.025 1.010 - 1.025   Blood, UA negative negative   pH, UA 5.5 5.0 - 8.0   Protein Ur, POC negative negative mg/dL   Urobilinogen, UA 0.2 0.2 or 1.0 E.U./dL   Nitrite, UA Negative Negative   Leukocytes, UA Negative Negative       ASSESSMENT AND PLAN:  Stpehanie was seen today for pelvic pain, nasal congestion and medication refill.  Diagnoses and all orders for this visit:  Sinus pain: Most likely allergies. Will restart her flonase.    COPD, moderate (HCC) -     albuterol (VENTOLIN HFA) 108 (90 Base) MCG/ACT inhaler; INHALE TWO PUFFS BY MOUTH EVERY 4 HOURS AS NEEDED FOR WHEEZING OR SHORTNESS OF BREATH  Seasonal allergic rhinitis due to pollen:  -     fluticasone (FLONASE) 50 MCG/ACT nasal spray; USE TWO SPRAY(S) IN EACH NOSTRIL ONCE DAILY  Pelvic pain -     oxybutynin (DITROPAN XL) 5 MG 24 hr tablet; Take 1 tablet (5 mg total) by mouth at bedtime.       -     POCT urinalysis dipstick  Lower abdominal pain: This pain is new, her colonoscopy is current, her urine is negative and she has seen GYN since this problem stared.  This may be a manifestation of overactive bladder, however I would hate to assume a benign process and miss an insidious one.  -     Sedimentation Rate -     CT Abdomen Pelvis Wo Contrast; Future -     CBC with Differential/Platelet    The patient is advised to call or return to clinic if she does not see an improvement in symptoms, or to seek the care of the closest emergency department if she worsens with the above plan.   Philis Fendt, MHS, PA-C [facility] 05/03/2017 6:50 PM

## 2017-05-03 NOTE — Patient Instructions (Signed)
     IF you received an x-ray today, you will receive an invoice from [facility]. Please contact [facility] at [phone] with questions or concerns regarding your invoice.   IF you received labwork today, you will receive an invoice from [facility]. Please contact [facility] at [phone] with questions or concerns regarding your invoice.   Our billing staff will not be able to assist you with questions regarding bills from these companies.  You will be contacted with the lab results as soon as they are available. The fastest way to get your results is to activate your My Chart account. Instructions are located on the last page of this paperwork. If you have not heard from us regarding the results in 2 weeks, please contact this office.     

## 2017-05-04 LAB — CBC WITH DIFFERENTIAL/PLATELET
Basophils Absolute: 0 10*3/uL (ref 0.0–0.2)
Basos: 0 %
EOS (ABSOLUTE): 0.3 10*3/uL (ref 0.0–0.4)
EOS: 4 %
HEMATOCRIT: 36.6 % (ref 34.0–46.6)
Hemoglobin: 12.5 g/dL (ref 11.1–15.9)
IMMATURE GRANS (ABS): 0 10*3/uL (ref 0.0–0.1)
IMMATURE GRANULOCYTES: 0 %
LYMPHS: 36 %
Lymphocytes Absolute: 3.3 10*3/uL — ABNORMAL HIGH (ref 0.7–3.1)
MCH: 30.7 pg (ref 26.6–33.0)
MCHC: 34.2 g/dL (ref 31.5–35.7)
MCV: 90 fL (ref 79–97)
Monocytes Absolute: 1.2 10*3/uL — ABNORMAL HIGH (ref 0.1–0.9)
Monocytes: 13 %
NEUTROS PCT: 47 %
Neutrophils Absolute: 4.3 10*3/uL (ref 1.4–7.0)
PLATELETS: 458 10*3/uL — AB (ref 150–379)
RBC: 4.07 x10E6/uL (ref 3.77–5.28)
RDW: 13.8 % (ref 12.3–15.4)
WBC: 9.2 10*3/uL (ref 3.4–10.8)

## 2017-05-04 LAB — SEDIMENTATION RATE: Sed Rate: 20 mm/hr (ref 0–40)

## 2017-05-16 ENCOUNTER — Ambulatory Visit
Admission: RE | Admit: 2017-05-16 | Discharge: 2017-05-16 | Disposition: A | Discharge status: Home / Self Care | Payer: Medicare Other | Source: Ambulatory Visit | Attending: Physician Assistant | Admitting: Physician Assistant

## 2017-05-16 DIAGNOSIS — R1030 Lower abdominal pain, unspecified: Secondary | ICD-10-CM

## 2017-05-16 DIAGNOSIS — R1032 Left lower quadrant pain: Secondary | ICD-10-CM | POA: Diagnosis not present

## 2017-05-17 DIAGNOSIS — K222 Esophageal obstruction: Secondary | ICD-10-CM | POA: Insufficient documentation

## 2017-05-17 DIAGNOSIS — Z86010 Personal history of colonic polyps: Secondary | ICD-10-CM | POA: Insufficient documentation

## 2017-05-17 DIAGNOSIS — K581 Irritable bowel syndrome with constipation: Secondary | ICD-10-CM | POA: Insufficient documentation

## 2017-05-17 DIAGNOSIS — K219 Gastro-esophageal reflux disease without esophagitis: Secondary | ICD-10-CM | POA: Insufficient documentation

## 2017-05-18 DIAGNOSIS — R1032 Left lower quadrant pain: Secondary | ICD-10-CM | POA: Diagnosis not present

## 2017-05-18 DIAGNOSIS — K581 Irritable bowel syndrome with constipation: Secondary | ICD-10-CM | POA: Diagnosis not present

## 2017-05-18 DIAGNOSIS — Z86010 Personal history of colonic polyps: Secondary | ICD-10-CM | POA: Diagnosis not present

## 2017-05-23 NOTE — Progress Notes (Signed)
Patient ID: Alison Friedman, female   DOB: May 01, 1946, 71 y.o.   MRN: [id number]           Chief complaint: Follow-up for endocrine problems   History of Present Illness:  PROBLEM 1: She had been referred in 2017 for management of osteoporosis  Initial history as follows: She has lost about 1 inch in height over the years She had periodic screening bone density measurements done and these have showed the following T-scores on the last measurement in 3/17: Femoral neck: Measurements  -2.6 and -2.5 Spine: -2.1 FRAX fracture risk at the hip is not calculated    Apparently her bone density has declined 7-10% at various sites  From prior measurements  She has no history of low trauma fracture or height loss  Age at menopause: 40-42 years.  She took hormone replacement only for a few months and has not had a hysterectomy Other risk factors: Prior history of smoking, she stopped in 2008 Positive family history of osteoporosis including her sister, mother and grandmother  Calcium supplements: None, she has intolerance to these because of constipation even if using calcium citrate or chewable calcium.  She does have some dairy products like yogurt and cheese and her diet and recently more milk. Exercise: She tries to walk as much as possible especially at her work  RECENT history: Current treatment: Actonel 35 mg weekly Vitamin D supplements: D3, 2000 units daily  She was prescribed Actonel 150 mg weekly in 5/17 but because of an episode of flulike symptoms and diarrhea she did not continue this However she agreed to try the 35 mg weekly dose on her visit in 5/18 This has not caused any GI side effects or other symptoms  LABS:  Vitamin D levels  Lab Results  Component Value Date   VD25OH 36.32 11/17/2016   VD25OH 18.22 (L) 11/24/2015    DIABETES: See review of systems   Past Medical History:  Diagnosis Date  . Allergy   . Anxiety   . Asthma   . Diabetes mellitus without  complication ([place])   . Pulmonary embolism ([place]) At age 76    With associated DVT    Past Surgical History:  Procedure Laterality Date  . APPENDECTOMY    . BREAST SURGERY    . NECK SURGERY     Disk and rod  . TUBAL LIGATION      Family History  Problem Relation Age of Onset  . Osteoporosis Mother   . Osteoporosis Sister   . Breast cancer Daughter 17  . Dementia Maternal Grandmother   . Stroke Maternal Grandfather     Social History:  reports that she has been smoking cigarettes.  She has a 8.75 pack-year smoking history. She has quit using smokeless tobacco. She reports that she does not drink alcohol or use drugs.  Allergies:  Allergies  Allergen Reactions  . Nitrofurantoin Monohyd Macro Anaphylaxis  . Penicillins Anaphylaxis  . Shellfish Allergy Anaphylaxis  . Tuna [Fish Allergy] Anaphylaxis  . Clarithromycin Other (See Comments)    Face turns red.  . Crestor [Rosuvastatin]   . Iodinated Diagnostic Agents     Prev CT reports state anaphylaxis with IV contrast, pt also states severe reaction with contrast  . Levofloxacin Other (See Comments)    Face turned red.  . Lipitor [Atorvastatin Calcium] Other (See Comments)    Muscle pain  . Percocet [Oxycodone-Acetaminophen] Swelling  . Sulfa Antibiotics Swelling    Allergies as of 05/24/2017  Reactions   Nitrofurantoin Monohyd Macro Anaphylaxis   Penicillins Anaphylaxis   Shellfish Allergy Anaphylaxis   Blain Pais Allergy] Anaphylaxis   Clarithromycin Other (See Comments)   Face turns red.   Crestor [rosuvastatin]    Iodinated Diagnostic Agents    Prev CT reports state anaphylaxis with IV contrast, pt also states severe reaction with contrast   Levofloxacin Other (See Comments)   Face turned red.   Lipitor [atorvastatin Calcium] Other (See Comments)   Muscle pain   Percocet [oxycodone-acetaminophen] Swelling   Sulfa Antibiotics Swelling      Medication List        Accurate as of 05/24/17 12:13 PM. Always  use your most recent med list.          albuterol 108 (90 Base) MCG/ACT inhaler Commonly known as:  VENTOLIN HFA INHALE TWO PUFFS BY MOUTH EVERY 4 HOURS AS NEEDED FOR WHEEZING OR SHORTNESS OF BREATH   ALPRAZolam 0.25 MG tablet Commonly known as:  XANAX Take 0.5-1 tablets (0.125-0.25 mg total) by mouth daily as needed for anxiety.   aspirin EC 81 MG tablet Take 81 mg by mouth daily.   chlorpheniramine 4 MG tablet Commonly known as:  CHLOR-TRIMETON Take 4 mg by mouth 2 (two) times daily as needed for allergies.   cholecalciferol 1000 units tablet Commonly known as:  VITAMIN D Take 2,000 Units by mouth 2 (two) times daily.   clobetasol cream 0.05 % Commonly known as:  TEMOVATE Apply nightly at bedtime for x 1 week, Week 2 apply twice a weekly, third week and thereafter apply once a week external genitalia   dicyclomine 20 MG tablet Commonly known as:  BENTYL TAKE ONE TABLET BY MOUTH EVERY 6 HOURS   EPINEPHrine 0.3 mg/0.3 mL Soaj injection Commonly known as:  EPI-PEN Inject 0.3 mLs (0.3 mg total) into the muscle once.   famotidine 20 MG tablet Commonly known as:  PEPCID Take 20 mg by mouth 2 (two) times daily.   fluticasone 50 MCG/ACT nasal spray Commonly known as:  FLONASE USE TWO SPRAY(S) IN EACH NOSTRIL ONCE DAILY   ibuprofen 800 MG tablet Commonly known as:  ADVIL,MOTRIN Take 800 mg by mouth 1 day or 1 dose.   linaclotide 72 MCG capsule Commonly known as:  LINZESS Take by mouth.   metFORMIN 500 MG 24 hr tablet Commonly known as:  GLUCOPHAGE-XR Take 2 tablets (1,000 mg total) by mouth 2 (two) times daily.   oxybutynin 5 MG 24 hr tablet Commonly known as:  DITROPAN XL Take 1 tablet (5 mg total) by mouth at bedtime.   pravastatin 40 MG tablet Commonly known as:  PRAVACHOL TAKE ONE TABLET BY MOUTH ONCE DAILY   risedronate 35 MG tablet Commonly known as:  ACTONEL Take 1 tablet (35 mg total) by mouth every 7 (seven) days. with water on empty stomach,  nothing by mouth or lie down for next 30 minutes.   telmisartan 40 MG tablet Commonly known as:  MICARDIS Take 1 tablet (40 mg total) by mouth daily.        Review of Systems  DIABETES: She has type 2 diabetes diagnosed in 2003.   Was started on metformin 1000 mg ER twice a day by her PCP and this has been continued unchanged  She checks her blood sugars occasionally with a Generic monitor: Fasting are 96-148 range, does not check after meals Has not had any A1c from PCP recently for any follow-up  She thinks she is doing better with her  diet since she had a consultation with the dietitian in 6/18  She is fairly active and trying to walk regularly  A1c is improved  Wt Readings from Last 3 Encounters:  05/24/17 175 lb (79.4 kg)  05/03/17 177 lb (80.3 kg)  04/05/17 176 lb 9.6 oz (80.1 kg)    Lab Results  Component Value Date   HGBA1C 6.4 05/24/2017   HGBA1C 6.9 (H) 07/21/2016   HGBA1C 6.8 02/04/2016   Lab Results  Component Value Date   MICROALBUR 0.2 07/01/2015   [place] 93 07/01/2015   CREATININE 1.12 11/17/2016    LABS:  Office Visit on 05/24/2017  Component Date Value Ref Range Status  . Hemoglobin A1C 05/24/2017 6.4   Final     PHYSICAL EXAM:  BP 128/66   Pulse 67   Ht 5\' 3"  (1.6 m)   Wt 175 lb (79.4 kg)   SpO2 98%   BMI 31.00 kg/m      ASSESSMENT:   OSTEOPOROSIS with mildly low T score of the femoral neck  She is now taking Actonel 35 mg weekly for the last 6 months or so without side effects Vitamin D has been supplemented and she has increased intake of dairy products and her diet She will continue the same regimen and plan on getting a bone density repeated in about a year   Diabetes: She has a BMI of 31 Treated with metformin A1c is now better at 6.4 Most likely she is doing better with diet since seeing the dietitian She is trying to be active also    PLAN:    She agrees to continue Actonel weekly  Continue vitamin D  supplement, she needs to add calcium in the form for multivitamin and she agrees to do that  She was given a One Touch Verio monitor to check her sugars more regularly and after meals also, to bring this on her follow-up visit   Alison Friedman 05/24/2017, 12:13 PM

## 2017-05-24 ENCOUNTER — Encounter: Payer: Self-pay | Admitting: Endocrinology

## 2017-05-24 ENCOUNTER — Ambulatory Visit (INDEPENDENT_AMBULATORY_CARE_PROVIDER_SITE_OTHER): Payer: Medicare Other | Admitting: Endocrinology

## 2017-05-24 VITALS — BP 128/66 | HR 67 | Ht 63.0 in | Wt 175.0 lb

## 2017-05-24 DIAGNOSIS — H40033 Anatomical narrow angle, bilateral: Secondary | ICD-10-CM | POA: Diagnosis not present

## 2017-05-24 DIAGNOSIS — M810 Age-related osteoporosis without current pathological fracture: Secondary | ICD-10-CM

## 2017-05-24 DIAGNOSIS — E1165 Type 2 diabetes mellitus with hyperglycemia: Secondary | ICD-10-CM

## 2017-05-24 LAB — POCT GLYCOSYLATED HEMOGLOBIN (HGB A1C): HEMOGLOBIN A1C: 6.4

## 2017-05-24 MED ORDER — GLUCOSE BLOOD VI STRP: ORAL_STRIP | 1 refills | Status: DC

## 2017-05-24 NOTE — Addendum Note (Signed)
Addended by: Elayne Snare on: 05/24/2017 03:48 PM   Modules accepted: Orders

## 2017-05-24 NOTE — Patient Instructions (Signed)
Check blood sugars on waking up  2-3/7  Also check blood sugars about 2 hours after a meal and do this after different meals by rotation  Recommended blood sugar levels on waking up is 90-130 and about 2 hours after meal is 130-160  Please bring your blood sugar monitor to each visit, thank you   

## 2017-05-29 ENCOUNTER — Telehealth: Payer: Self-pay | Admitting: Physician Assistant

## 2017-05-29 NOTE — Telephone Encounter (Signed)
Please review

## 2017-05-29 NOTE — Telephone Encounter (Signed)
Copied from [place] [id number]. Topic: Quick Communication - See Telephone Encounter >> May 29, 2017  2:57 PM Cleaster Corin, [place] wrote: CRM for notification. See Telephone encounter for:   05/29/17. Pt called needs a refill on telmisarlan 40mg  (micardis) pt. Satated that she took the last pill today. Pt. Stated that she went to the pharmacy over the weekend and they gave her 3 emergency pills. But now she is completely out. Please call pt. When done.   Not sure who her primary provider is- unsure if can refill.

## 2017-05-29 NOTE — Telephone Encounter (Signed)
Copied from [place] [id number]. Topic: Quick Communication - See Telephone Encounter >> May 29, 2017  2:57 PM Cleaster Corin, [place] wrote: CRM for notification. See Telephone encounter for:   05/29/17. Pt called needs a refill on telmisarlan 40mg  (micardis) pt. Satated that she took the last pill today. Pt. Stated that she went to the pharmacy over the weekend and they gave her 3 emergency pills. But now she is completely out. Please call pt. When done.

## 2017-05-30 DIAGNOSIS — E782 Mixed hyperlipidemia: Secondary | ICD-10-CM | POA: Diagnosis not present

## 2017-05-30 DIAGNOSIS — E119 Type 2 diabetes mellitus without complications: Secondary | ICD-10-CM | POA: Diagnosis not present

## 2017-05-30 DIAGNOSIS — Z87891 Personal history of nicotine dependence: Secondary | ICD-10-CM | POA: Diagnosis not present

## 2017-05-30 DIAGNOSIS — I2510 Atherosclerotic heart disease of native coronary artery without angina pectoris: Secondary | ICD-10-CM | POA: Diagnosis not present

## 2017-05-31 ENCOUNTER — Telehealth: Payer: Self-pay | Admitting: Physician Assistant

## 2017-05-31 NOTE — Telephone Encounter (Unsigned)
Copied from [place] [id number]. Topic: Inquiry >> May 31, 2017 10:44 AM Alison Friedman I, [place] wrote: Reason for CRM: pt call for her Rx refill for MICARDIS 40 MG she use [facility] [phone] [phone]

## 2017-05-31 NOTE — Telephone Encounter (Signed)
Patient is calling again requesting to make sure that we had the right medicine sent over to the DR for the refill. She said she has been out for two days & called yesterday also, she would like it sent to [facility]. Telmisartan 40 mg

## 2017-05-31 NOTE — Telephone Encounter (Signed)
Okay to refill for 90 days. I'd like to see her back as we have never gone through her HTN.

## 2017-05-31 NOTE — Telephone Encounter (Signed)
Sent to Legrand [facility] to review as to if he will prescribe.

## 2017-05-31 NOTE — Telephone Encounter (Signed)
Pt requesting refill for Micardis. Was originally rx by Terri Piedra FNP and [place] at [place]. Now sees Philis Fendt at [facility]. Do you want this refilled ?

## 2017-06-01 ENCOUNTER — Other Ambulatory Visit: Payer: Self-pay

## 2017-06-01 MED ORDER — TELMISARTAN 40 MG PO TABS: 40.0000 mg | ORAL_TABLET | Freq: Every day | ORAL | 0 refills | Status: DC

## 2017-06-01 NOTE — Telephone Encounter (Signed)
[facility] PA reviewed and ordered refill for #90. LMOVM med was sent to pharmacy and it was the same as prior MD ordered. Telmisartan 40mg  Advised pt to make appt with Philis Fendt PA asap to evaluate HTN - prior to needing more med

## 2017-06-01 NOTE — Telephone Encounter (Signed)
Done

## 2017-06-05 ENCOUNTER — Other Ambulatory Visit: Payer: Self-pay

## 2017-06-05 MED ORDER — GLUCOSE BLOOD VI STRP: ORAL_STRIP | 1 refills | Status: DC

## 2017-06-06 ENCOUNTER — Encounter: Payer: Self-pay | Admitting: Acute Care

## 2017-06-13 ENCOUNTER — Ambulatory Visit (INDEPENDENT_AMBULATORY_CARE_PROVIDER_SITE_OTHER)
Admission: RE | Admit: 2017-06-13 | Discharge: 2017-06-13 | Disposition: A | Discharge status: Home / Self Care | Payer: Medicare Other | Source: Ambulatory Visit | Attending: Acute Care | Admitting: Acute Care

## 2017-06-13 DIAGNOSIS — Z87891 Personal history of nicotine dependence: Secondary | ICD-10-CM | POA: Diagnosis not present

## 2017-06-13 DIAGNOSIS — F17210 Nicotine dependence, cigarettes, uncomplicated: Secondary | ICD-10-CM

## 2017-06-21 ENCOUNTER — Other Ambulatory Visit: Payer: Self-pay | Admitting: Acute Care

## 2017-06-21 DIAGNOSIS — F17210 Nicotine dependence, cigarettes, uncomplicated: Secondary | ICD-10-CM

## 2017-06-21 DIAGNOSIS — Z122 Encounter for screening for malignant neoplasm of respiratory organs: Secondary | ICD-10-CM

## 2017-07-17 IMAGING — CR DG CHEST 2V
2 series · 2 of 2 positions shown · non-contrast
Comparison: 09/10/2012

CLINICAL DATA: Wheezing, smoker

EXAM:
CHEST  2 VIEW

[lateral]
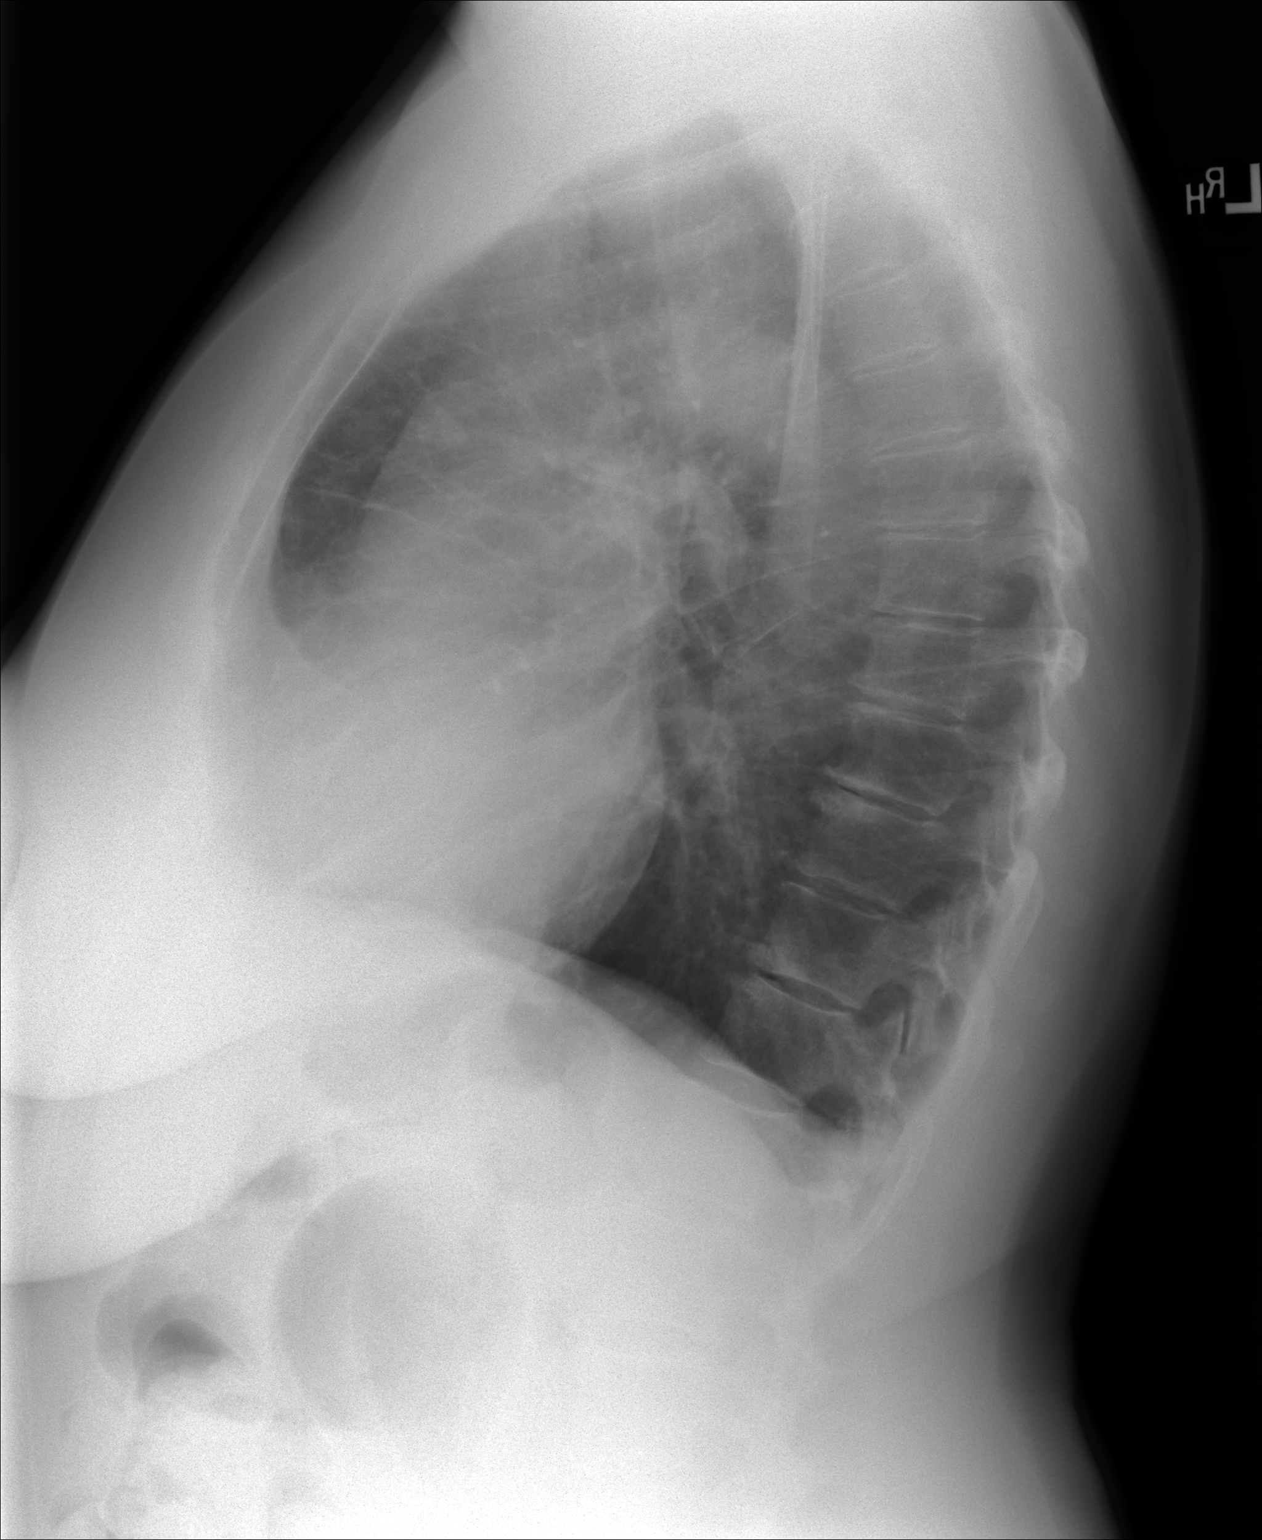

[PA]
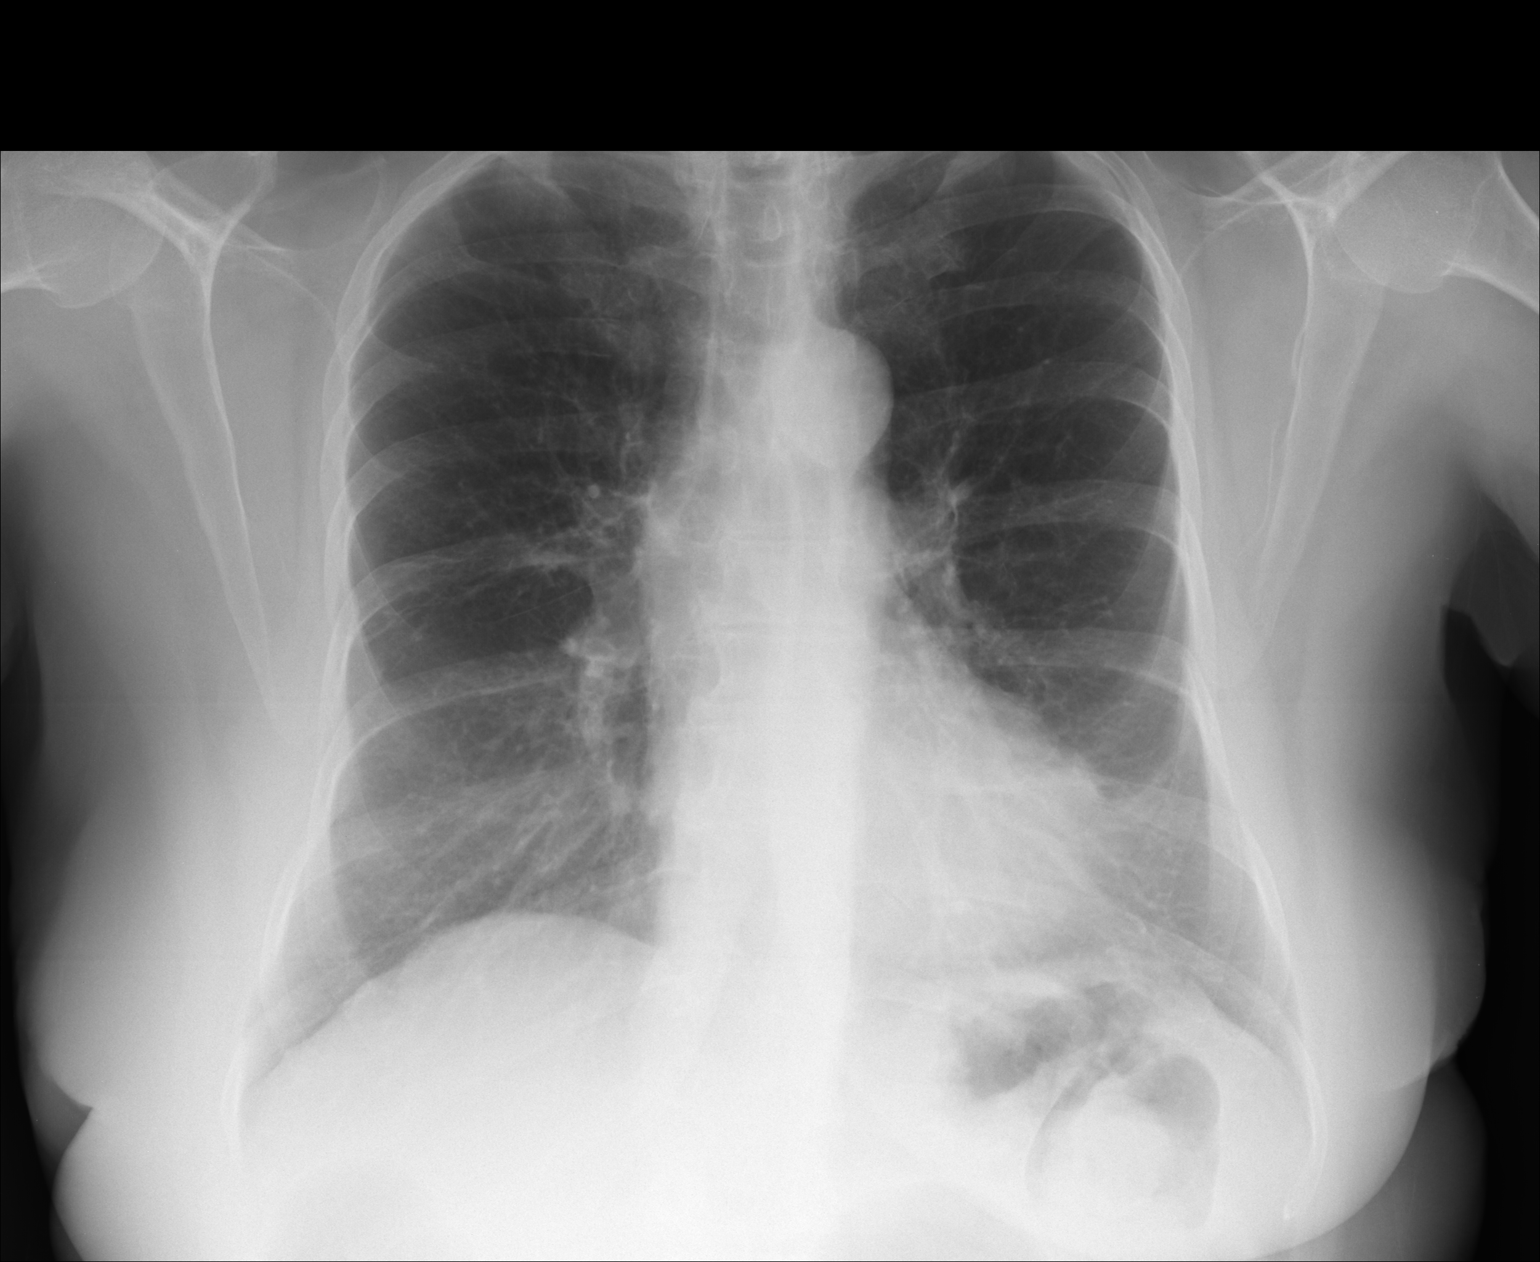

[2 of 2 positions shown; findings below may reference images not displayed]

FINDINGS: COPD with hyperinflation. Heart size within normal limits. Lungs are
clear without infiltrate or effusion. Negative for heart failure.
There is mild scarring bilaterally.
IMPRESSION: COPD without acute abnormality.

## 2017-07-18 ENCOUNTER — Encounter: Payer: Self-pay | Admitting: Physician Assistant

## 2017-07-18 ENCOUNTER — Ambulatory Visit (INDEPENDENT_AMBULATORY_CARE_PROVIDER_SITE_OTHER): Payer: Medicare Other | Admitting: Physician Assistant

## 2017-07-18 VITALS — BP 126/80 | HR 70 | Temp 98.1°F | Resp 17 | Ht 63.0 in | Wt 174.0 lb

## 2017-07-18 DIAGNOSIS — R05 Cough: Secondary | ICD-10-CM

## 2017-07-18 DIAGNOSIS — R059 Cough, unspecified: Secondary | ICD-10-CM

## 2017-07-18 DIAGNOSIS — E119 Type 2 diabetes mellitus without complications: Secondary | ICD-10-CM | POA: Diagnosis not present

## 2017-07-18 MED ORDER — HYDROCOD POLST-CPM POLST ER 10-8 MG/5ML PO SUER: 5.0000 mL | Freq: Two times a day (BID) | ORAL | 0 refills | Status: DC | PRN

## 2017-07-18 MED ORDER — PRAVASTATIN SODIUM 40 MG PO TABS: 40.0000 mg | ORAL_TABLET | Freq: Every day | ORAL | 3 refills | Status: DC

## 2017-07-18 MED ORDER — METFORMIN HCL ER 500 MG PO TB24: 1000.0000 mg | ORAL_TABLET | Freq: Two times a day (BID) | ORAL | 3 refills | Status: DC

## 2017-07-18 MED ORDER — DOXYCYCLINE HYCLATE 100 MG PO CAPS: 100.0000 mg | ORAL_CAPSULE | Freq: Two times a day (BID) | ORAL | 0 refills | Status: AC

## 2017-07-18 MED ORDER — TELMISARTAN 40 MG PO TABS: 40.0000 mg | ORAL_TABLET | Freq: Every day | ORAL | 3 refills | Status: DC

## 2017-07-18 NOTE — Patient Instructions (Addendum)
Take 1000 mg of Tylenol every 8 hours as needed for every day aches and pains.  Take Ibuprofen when this is not working.     IF you received an x-ray today, you will receive an invoice from [facility]. Please contact [facility] at [phone] with questions or concerns regarding your invoice.   IF you received labwork today, you will receive an invoice from [facility]. Please contact [facility] at [phone] with questions or concerns regarding your invoice.   Our billing staff will not be able to assist you with questions regarding bills from these companies.  You will be contacted with the lab results as soon as they are available. The fastest way to get your results is to activate your My Chart account. Instructions are located on the last page of this paperwork. If you have not heard from [place] regarding the results in 2 weeks, please contact this office.

## 2017-07-18 NOTE — Progress Notes (Signed)
07/22/2017 8:28 AM   DOB: [birth year] / MRN: [id number]  SUBJECTIVE:  Alison Friedman is a 72 y.o. female presenting for diabetes recheck. She last saw her eye doctor and tells me her eyes are healthy from a diabetes standpoint.    She is concerned about her chest with regard to cough.  Husband was here a few days back and was treated for the flu.  She associates chest tightness but denies difficulty breathing.  She feels that she is no getting better or worse.       She is allergic to nitrofurantoin monohyd macro; penicillins; shellfish allergy; tuna [fish allergy]; clarithromycin; crestor [rosuvastatin]; iodinated diagnostic agents; levofloxacin; lipitor [atorvastatin calcium]; percocet [oxycodone-acetaminophen]; and sulfa antibiotics.   She  has a past medical history of Allergy, Anxiety, Asthma, Diabetes mellitus without complication ([place]), and Pulmonary embolism ([place]) (At age 5 ).    She  reports that she has been smoking cigarettes.  She has a 8.75 pack-year smoking history. She has quit using smokeless tobacco. She reports that she does not drink alcohol or use drugs. She  reports that she currently engages in sexual activity. She reports using the following method of birth control/protection: None. The patient  has a past surgical history that includes Appendectomy; Tubal ligation; Breast surgery; and Neck surgery.  Her family history includes Breast cancer (age of onset: 74) in her daughter; Dementia in her maternal grandmother; Osteoporosis in her mother and sister; Stroke in her maternal grandfather.  Review of Systems  Constitutional: Negative for chills, diaphoresis and fever.  HENT: Positive for congestion.   Respiratory: Positive for cough. Negative for hemoptysis, sputum production, shortness of breath and wheezing.   Cardiovascular: Negative for chest pain, orthopnea and leg swelling.  Gastrointestinal: Negative for nausea.  Skin: Negative for rash.  Neurological: Negative  for dizziness.    The problem list and medications were reviewed and updated by myself where necessary and exist elsewhere in the encounter.   OBJECTIVE:  BP 126/80   Pulse 70   Temp 98.1 F (36.7 C) (Oral)   Resp 17   Ht _0  (1.6 m)   Wt 174 lb (78.9 kg)   SpO2 98%   BMI 30.82 kg/m   Physical Exam  Constitutional: She is oriented to person, place, and time. She is active.  Non-toxic appearance.  HENT:  Right Ear: Hearing, tympanic membrane, external ear and ear canal normal.  Left Ear: Hearing, tympanic membrane, external ear and ear canal normal.  Nose: Nose normal. Right sinus exhibits no maxillary sinus tenderness and no frontal sinus tenderness. Left sinus exhibits no maxillary sinus tenderness and no frontal sinus tenderness.  Mouth/Throat: Uvula is midline, oropharynx is clear and moist and mucous membranes are normal. Mucous membranes are not dry. No oropharyngeal exudate, posterior oropharyngeal edema or tonsillar abscesses.  Eyes: EOM are normal. Pupils are equal, round, and reactive to light.  Cardiovascular: Normal rate, regular rhythm, S1 normal, S2 normal, normal heart sounds and intact distal pulses. Exam reveals no gallop, no friction rub and no decreased pulses.  No murmur heard. Pulmonary/Chest: Effort normal. No stridor. No tachypnea. No respiratory distress. She has no wheezes. She has no rales.  Abdominal: She exhibits no distension.  Musculoskeletal: She exhibits no edema.  Lymphadenopathy:       Head (right side): No submandibular and no tonsillar adenopathy present.       Head (left side): No submandibular and no tonsillar adenopathy present.    She has  no cervical adenopathy.  Neurological: She is alert and oriented to person, place, and time. She has normal strength and normal reflexes. She is not disoriented. She displays no atrophy. No cranial nerve deficit or sensory deficit. She exhibits normal muscle tone. Coordination and gait normal.  Skin: Skin  is warm and dry. She is not diaphoretic. No pallor.  Psychiatric: Her behavior is normal.    Lab Results  Component Value Date   HGBA1C 6.4 05/24/2017   Lab Results  Component Value Date   MICROALBUR 0.2 07/01/2015   Lab Results  Component Value Date   CREATININE 1.16 (H) 07/18/2017   Lab Results  Component Value Date   ALT 15 07/18/2017   AST 18 07/18/2017   ALKPHOS 98 07/18/2017   BILITOT 0.3 07/18/2017     No results found for this or any previous visit (from the past 72 hour(s)).  No results found.  ASSESSMENT AND PLAN:  Alison Friedman was seen today for medication refill.  Diagnoses and all orders for this visit:  Well controlled diabetes mellitus ([place]) -     CMP14+EGFR -     Microalbumin, urine  Type 2 diabetes mellitus without complication, without long-term current use of insulin ([facility]) -     metFORMIN (GLUCOPHAGE-XR) 500 MG 24 hr tablet; Take 2 tablets (1,000 mg total) by mouth 2 (two) times daily. -     pravastatin (PRAVACHOL) 40 MG tablet; Take 1 tablet (40 mg total) by mouth daily. -     telmisartan (MICARDIS) 40 MG tablet; Take 1 tablet (40 mg total) by mouth daily.  Cough: Given history of diabetes and exposure to the flu will go ahead and cover for an early staph and atypical pneumonia.  RTC as needed.  Fortunately she has had the flu shot.  -     doxycycline (VIBRAMYCIN) 100 MG capsule; Take 1 capsule (100 mg total) by mouth 2 (two) times daily for 10 days. -     chlorpheniramine-HYDROcodone (TUSSIONEX PENNKINETIC ER) 10-8 MG/5ML SUER; Take 5 mLs by mouth every 12 (twelve) hours as needed for cough.    The patient is advised to call or return to clinic if she does not see an improvement in symptoms, or to seek the care of the closest emergency department if she worsens with the above plan.   Philis Fendt, MHS, PA-C [facility] 07/22/2017 8:28 AM

## 2017-07-19 LAB — CMP14+EGFR
A/G RATIO: 1.5 (ref 1.2–2.2)
ALT: 15 IU/L (ref 0–32)
AST: 18 IU/L (ref 0–40)
Albumin: 4.3 g/dL (ref 3.5–4.8)
Alkaline Phosphatase: 98 IU/L (ref 39–117)
BILIRUBIN TOTAL: 0.3 mg/dL (ref 0.0–1.2)
BUN/Creatinine Ratio: 21 (ref 12–28)
BUN: 24 mg/dL (ref 8–27)
CHLORIDE: 100 mmol/L (ref 96–106)
CO2: 17 mmol/L — ABNORMAL LOW (ref 20–29)
Calcium: 9.9 mg/dL (ref 8.7–10.3)
Creatinine, Ser: 1.16 mg/dL — ABNORMAL HIGH (ref 0.57–1.00)
GFR calc Af Amer: 55 mL/min/{1.73_m2} — ABNORMAL LOW (ref >59)
GFR calc non Af Amer: 47 mL/min/{1.73_m2} — ABNORMAL LOW (ref >59)
GLOBULIN, TOTAL: 2.9 g/dL (ref 1.5–4.5)
Glucose: 101 mg/dL — ABNORMAL HIGH (ref 65–99)
POTASSIUM: 5 mmol/L (ref 3.5–5.2)
SODIUM: 136 mmol/L (ref 134–144)
Total Protein: 7.2 g/dL (ref 6.0–8.5)

## 2017-07-19 LAB — MICROALBUMIN, URINE: Microalbumin, Urine: <3 ug/mL

## 2017-08-01 DIAGNOSIS — R1031 Right lower quadrant pain: Secondary | ICD-10-CM | POA: Insufficient documentation

## 2017-08-02 DIAGNOSIS — K581 Irritable bowel syndrome with constipation: Secondary | ICD-10-CM | POA: Diagnosis not present

## 2017-08-02 DIAGNOSIS — R1032 Left lower quadrant pain: Secondary | ICD-10-CM | POA: Diagnosis not present

## 2017-08-02 DIAGNOSIS — K219 Gastro-esophageal reflux disease without esophagitis: Secondary | ICD-10-CM | POA: Diagnosis not present

## 2017-08-02 DIAGNOSIS — K222 Esophageal obstruction: Secondary | ICD-10-CM | POA: Diagnosis not present

## 2017-08-06 DIAGNOSIS — E782 Mixed hyperlipidemia: Secondary | ICD-10-CM | POA: Diagnosis not present

## 2017-08-06 DIAGNOSIS — I2510 Atherosclerotic heart disease of native coronary artery without angina pectoris: Secondary | ICD-10-CM | POA: Diagnosis not present

## 2017-08-09 DIAGNOSIS — E119 Type 2 diabetes mellitus without complications: Secondary | ICD-10-CM | POA: Diagnosis not present

## 2017-08-09 DIAGNOSIS — I2510 Atherosclerotic heart disease of native coronary artery without angina pectoris: Secondary | ICD-10-CM | POA: Diagnosis not present

## 2017-08-09 DIAGNOSIS — E782 Mixed hyperlipidemia: Secondary | ICD-10-CM | POA: Diagnosis not present

## 2017-08-09 DIAGNOSIS — Z87891 Personal history of nicotine dependence: Secondary | ICD-10-CM | POA: Diagnosis not present

## 2017-08-24 DIAGNOSIS — E782 Mixed hyperlipidemia: Secondary | ICD-10-CM | POA: Diagnosis not present

## 2017-08-27 DIAGNOSIS — H40031 Anatomical narrow angle, right eye: Secondary | ICD-10-CM | POA: Diagnosis not present

## 2017-09-03 DIAGNOSIS — H40032 Anatomical narrow angle, left eye: Secondary | ICD-10-CM | POA: Diagnosis not present

## 2017-09-07 DIAGNOSIS — I2510 Atherosclerotic heart disease of native coronary artery without angina pectoris: Secondary | ICD-10-CM | POA: Diagnosis not present

## 2017-09-28 DIAGNOSIS — E782 Mixed hyperlipidemia: Secondary | ICD-10-CM | POA: Diagnosis not present

## 2017-10-04 DIAGNOSIS — Z789 Other specified health status: Secondary | ICD-10-CM | POA: Diagnosis not present

## 2017-10-04 DIAGNOSIS — Z87891 Personal history of nicotine dependence: Secondary | ICD-10-CM | POA: Diagnosis not present

## 2017-10-04 DIAGNOSIS — I2510 Atherosclerotic heart disease of native coronary artery without angina pectoris: Secondary | ICD-10-CM | POA: Diagnosis not present

## 2017-10-04 DIAGNOSIS — E782 Mixed hyperlipidemia: Secondary | ICD-10-CM | POA: Diagnosis not present

## 2017-10-16 DIAGNOSIS — K581 Irritable bowel syndrome with constipation: Secondary | ICD-10-CM | POA: Diagnosis not present

## 2017-10-16 DIAGNOSIS — R1032 Left lower quadrant pain: Secondary | ICD-10-CM | POA: Diagnosis not present

## 2017-10-16 DIAGNOSIS — K219 Gastro-esophageal reflux disease without esophagitis: Secondary | ICD-10-CM | POA: Diagnosis not present

## 2017-10-16 DIAGNOSIS — K222 Esophageal obstruction: Secondary | ICD-10-CM | POA: Diagnosis not present

## 2017-10-18 DIAGNOSIS — E782 Mixed hyperlipidemia: Secondary | ICD-10-CM | POA: Diagnosis not present

## 2017-10-22 ENCOUNTER — Other Ambulatory Visit: Payer: Self-pay

## 2017-10-22 ENCOUNTER — Ambulatory Visit (INDEPENDENT_AMBULATORY_CARE_PROVIDER_SITE_OTHER): Payer: Medicare Other | Admitting: Physician Assistant

## 2017-10-22 ENCOUNTER — Encounter: Payer: Self-pay | Admitting: Physician Assistant

## 2017-10-22 VITALS — BP 144/86 | HR 70 | Resp 16 | Ht 63.0 in | Wt 178.0 lb

## 2017-10-22 DIAGNOSIS — D72820 Lymphocytosis (symptomatic): Secondary | ICD-10-CM | POA: Diagnosis not present

## 2017-10-22 DIAGNOSIS — T782XXD Anaphylactic shock, unspecified, subsequent encounter: Secondary | ICD-10-CM

## 2017-10-22 DIAGNOSIS — Z8659 Personal history of other mental and behavioral disorders: Secondary | ICD-10-CM

## 2017-10-22 DIAGNOSIS — E119 Type 2 diabetes mellitus without complications: Secondary | ICD-10-CM

## 2017-10-22 LAB — POCT GLYCOSYLATED HEMOGLOBIN (HGB A1C): Hemoglobin A1C: 7.1

## 2017-10-22 MED ORDER — EPINEPHRINE 0.3 MG/0.3ML IJ SOAJ: 0.3000 mg | Freq: Once | INTRAMUSCULAR | 1 refills | Status: AC

## 2017-10-22 MED ORDER — TELMISARTAN 40 MG PO TABS: 40.0000 mg | ORAL_TABLET | Freq: Every day | ORAL | 3 refills | Status: DC

## 2017-10-22 MED ORDER — DAPAGLIFLOZIN PROPANEDIOL 5 MG PO TABS
5.0000 mg | ORAL_TABLET | Freq: Every day | ORAL | 0 refills | Status: DC
Start: 2017-10-22 — End: 2018-01-21

## 2017-10-22 MED ORDER — ALPRAZOLAM 0.25 MG PO TABS: 0.1250 mg | ORAL_TABLET | Freq: Every day | ORAL | 1 refills | Status: DC | PRN

## 2017-10-22 NOTE — Progress Notes (Signed)
10/23/2017 4:09 PM   DOB: [birth year] / MRN: [id number]  SUBJECTIVE:  Alison Friedman is a 72 y.o. female presenting for diabetes recheck.  She is excited today because Dr. Nadyne Coombes has been administering Repatha injections and her cholesterol is better than it has ever been. She tells me that she has gained some weight since her last visit.  She is complaint with metformin 2 grams daily and does not complain about diarrhea. Labs reveal no diabetic nephropathy. She denies foot pain today.   She is allergic to nitrofurantoin monohyd macro; penicillins; shellfish allergy; tuna [fish allergy]; clarithromycin; crestor [rosuvastatin]; iodinated diagnostic agents; levofloxacin; lipitor [atorvastatin calcium]; percocet [oxycodone-acetaminophen]; and sulfa antibiotics.   She  has a past medical history of Allergy, Anxiety, Asthma, Diabetes mellitus without complication ([place]), and Pulmonary embolism ([place]) (At age 29 ).    She  reports that she has been smoking cigarettes.  She has a 8.75 pack-year smoking history. She has quit using smokeless tobacco. She reports that she does not drink alcohol or use drugs. She  reports that she currently engages in sexual activity. She reports using the following method of birth control/protection: None. The patient  has a past surgical history that includes Appendectomy; Tubal ligation; Breast surgery; and Neck surgery.  Her family history includes Breast cancer (age of onset: 52) in her daughter; Dementia in her maternal grandmother; Osteoporosis in her mother and sister; Stroke in her maternal grandfather.  Review of Systems  Constitutional: Negative for chills, diaphoresis and fever.  Eyes: Negative.   Respiratory: Negative for cough, hemoptysis, sputum production, shortness of breath and wheezing.   Cardiovascular: Negative for chest pain, orthopnea and leg swelling.  Gastrointestinal: Negative for nausea.  Skin: Negative for rash.  Neurological: Negative for  dizziness, sensory change, speech change, focal weakness and headaches.    The problem list and medications were reviewed and updated by myself where necessary and exist elsewhere in the encounter.   OBJECTIVE:  BP (!) 144/86   Pulse 70   Resp 16   Ht 5\' 3"  (1.6 m)   Wt 178 lb (80.7 kg)   SpO2 95%   BMI 31.53 kg/m    BP Readings from Last 3 Encounters:  10/22/17 (!) 144/86  07/18/17 126/80  05/24/17 128/66   Wt Readings from Last 3 Encounters:  10/22/17 178 lb (80.7 kg)  07/18/17 174 lb (78.9 kg)  05/24/17 175 lb (79.4 kg)      Lab Results  Component Value Date   HGBA1C 7.1 10/22/2017   Lab Results  Component Value Date   WBC 9.5 10/22/2017   WBC WILL FOLLOW 10/22/2017   HGB 13.1 10/22/2017   HGB WILL FOLLOW 10/22/2017   HCT 39.0 10/22/2017   HCT WILL FOLLOW 10/22/2017   MCV 91 10/22/2017   MCV WILL FOLLOW 10/22/2017   PLT 423 (H) 10/22/2017   PLT WILL FOLLOW 10/22/2017    Lab Results  Component Value Date   CREATININE 1.09 (H) 10/22/2017   BUN 25 10/22/2017   NA 139 10/22/2017   K 4.8 10/22/2017   CL 103 10/22/2017   CO2 19 (L) 10/22/2017    Lab Results  Component Value Date   ALT 15 07/18/2017   AST 18 07/18/2017   ALKPHOS 98 07/18/2017   BILITOT 0.3 07/18/2017    Lab Results  Component Value Date   TSH 4.17 11/24/2015    Lab Results  Component Value Date   HGBA1C 7.1 10/22/2017    Lab  Results  Component Value Date   CHOL 181 07/01/2015   HDL 46 07/01/2015   LDLCALC 93 07/01/2015   TRIG 209 (H) 07/01/2015   CHOLHDL 3.9 07/01/2015   Lab Results  Component Value Date   MICROALBUR 0.2 07/01/2015      Physical Exam  Constitutional: She is oriented to person, place, and time. She is active.  Non-toxic appearance.  Eyes: Pupils are equal, round, and reactive to light. EOM are normal.  Cardiovascular: Normal rate, regular rhythm, S1 normal, S2 normal, normal heart sounds and intact distal pulses. Exam reveals no gallop, no  friction rub and no decreased pulses.  No murmur heard. Pulmonary/Chest: Effort normal. No stridor. No tachypnea. No respiratory distress. She has no wheezes. She has no rales.  Abdominal: She exhibits no distension.  Musculoskeletal: She exhibits no edema.  Lymphadenopathy:       Head (right side): No submental, no submandibular, no tonsillar, no preauricular, no posterior auricular and no occipital adenopathy present.       Head (left side): No submental, no submandibular, no tonsillar, no preauricular, no posterior auricular and no occipital adenopathy present.    She has no cervical adenopathy.  Neurological: She is alert and oriented to person, place, and time. She has normal strength and normal reflexes. She is not disoriented. She displays no atrophy. No cranial nerve deficit or sensory deficit. She exhibits normal muscle tone. Coordination and gait normal.  Skin: Skin is warm and dry. She is not diaphoretic. No pallor.  Psychiatric: Her behavior is normal.    Results for orders placed or performed in visit on 10/22/17 (from the past 72 hour(s))  CBC with Differential/Platelet     Status: Abnormal   Collection Time: 10/22/17  4:31 PM  Result Value Ref Range   WBC 9.5 3.4 - 10.8 x10E3/uL   RBC 4.31 3.77 - 5.28 x10E6/uL   Hemoglobin 13.1 11.1 - 15.9 g/dL   Hematocrit 39.0 34.0 - 46.6 %   MCV 91 79 - 97 fL   MCH 30.4 26.6 - 33.0 pg   MCHC 33.6 31.5 - 35.7 g/dL   RDW 14.1 12.3 - 15.4 %   Platelets 423 (H) 150 - 379 x10E3/uL   Neutrophils 48 Not Estab. %   Lymphs 38 Not Estab. %   Monocytes 11 Not Estab. %   Eos 3 Not Estab. %   Basos 0 Not Estab. %   Neutrophils Absolute 4.6 1.4 - 7.0 x10E3/uL   Lymphocytes Absolute 3.6 (H) 0.7 - 3.1 x10E3/uL   Monocytes Absolute 1.0 (H) 0.1 - 0.9 x10E3/uL   EOS (ABSOLUTE) 0.3 0.0 - 0.4 x10E3/uL   Basophils Absolute 0.0 0.0 - 0.2 x10E3/uL   Immature Granulocytes 0 Not Estab. %   Immature Grans (Abs) 0.0 0.0 - 0.1 x10E3/uL  Renal Function  Panel     Status: Abnormal   Collection Time: 10/22/17  4:31 PM  Result Value Ref Range   Glucose 83 65 - 99 mg/dL   BUN 25 8 - 27 mg/dL   Creatinine, Ser 1.09 (H) 0.57 - 1.00 mg/dL   GFR calc non Af Amer 51 (L) >59 mL/min/1.73   GFR calc Af Amer 59 (L) >59 mL/min/1.73   BUN/Creatinine Ratio 23 12 - 28   Sodium 139 134 - 144 mmol/L   Potassium 4.8 3.5 - 5.2 mmol/L   Chloride 103 96 - 106 mmol/L   CO2 19 (L) 20 - 29 mmol/L   Calcium 9.7 8.7 - 10.3 mg/dL  Phosphorus 3.7 2.5 - 4.5 mg/dL   Albumin 4.2 3.5 - 4.8 g/dL  Pathologist smear review     Status: None (Preliminary result)   Collection Time: 10/22/17  4:31 PM  Result Value Ref Range   Path Rev WBC Comment     Comment: Specimen has been received and testing has been initiated.   Path Rev RBC WILL FOLLOW    Path Rev PLTs WILL FOLLOW    PATH INTERP BLD-IMP WILL FOLLOW    PATHOLOGIST NAME WILL FOLLOW    WBC WILL FOLLOW    RBC WILL FOLLOW    Hemoglobin WILL FOLLOW    Hematocrit WILL FOLLOW    MCV WILL FOLLOW    MCH WILL FOLLOW    MCHC WILL FOLLOW    RDW WILL FOLLOW    Platelets WILL FOLLOW    Neutrophils WILL FOLLOW    Lymphs WILL FOLLOW    Monocytes WILL FOLLOW    Eos WILL FOLLOW    Basos WILL FOLLOW    Immature Cells WILL FOLLOW    Neutrophils Absolute WILL FOLLOW    Lymphocytes Absolute WILL FOLLOW    Monocytes Absolute WILL FOLLOW    EOS (ABSOLUTE) WILL FOLLOW    Basophils Absolute WILL FOLLOW    Immature Granulocytes WILL FOLLOW    Immature Grans (Abs) WILL FOLLOW    NRBC WILL FOLLOW    Hematology Comments: WILL FOLLOW   Specimen status report     Status: None (Preliminary result)   Collection Time: 10/22/17  4:31 PM  Result Value Ref Range   specimen status report Comment     Comment: Written Authorization Written Authorization   POCT glycosylated hemoglobin (Hb A1C)     Status: None   Collection Time: 10/22/17  4:34 PM  Result Value Ref Range   Hemoglobin A1C 7.1     No results found.  ASSESSMENT  AND PLAN:  Alison Friedman was seen today for diabetes and medication refill.  Diagnoses and all orders for this visit:  Type 2 diabetes mellitus without complication, without long-term current use of insulin ([place]): Mild increase in A1c today.Starting [place] at its lowest dose.  Will recheck A1c in an office visit in three months.   -     telmisartan (MICARDIS) 40 MG tablet; Take 1 tablet (40 mg total) by mouth daily. -     POCT glycosylated hemoglobin (Hb A1C) -     CBC with Differential/Platelet -     Renal Function Panel  Anaphylactic reaction, subsequent encounter -     EPINEPHrine 0.3 mg/0.3 mL IJ SOAJ injection; Inject 0.3 mLs (0.3 mg total) into the muscle once for 1 dose.  History of panic attacks -     ALPRAZolam (XANAX) 0.25 MG tablet; Take 0.5-1 tablets (0.125-0.25 mg total) by mouth daily as needed for anxiety.  Other orders -     dapagliflozin propanediol (FARXIGA) 5 MG TABS tablet; Take 5 mg by mouth daily. -     Pathologist smear review -     Specimen status report    The patient is advised to call or return to clinic if she does not see an improvement in symptoms, or to seek the care of the closest emergency department if she worsens with the above plan.   Philis Fendt, MHS, PA-C [facility] 10/23/2017 4:09 PM

## 2017-10-22 NOTE — Patient Instructions (Signed)
     IF you received an x-ray today, you will receive an invoice from [facility]. Please contact [facility] at [phone] with questions or concerns regarding your invoice.   IF you received labwork today, you will receive an invoice from [facility]. Please contact [facility] at [phone] with questions or concerns regarding your invoice.   Our billing staff will not be able to assist you with questions regarding bills from these companies.  You will be contacted with the lab results as soon as they are available. The fastest way to get your results is to activate your My Chart account. Instructions are located on the last page of this paperwork. If you have not heard from us regarding the results in 2 weeks, please contact this office.     

## 2017-10-23 LAB — CBC WITH DIFFERENTIAL/PLATELET
BASOS: 0 %
Basophils Absolute: 0 10*3/uL (ref 0.0–0.2)
EOS (ABSOLUTE): 0.3 10*3/uL (ref 0.0–0.4)
EOS: 3 %
HEMATOCRIT: 39 % (ref 34.0–46.6)
Hemoglobin: 13.1 g/dL (ref 11.1–15.9)
IMMATURE GRANULOCYTES: 0 %
Immature Grans (Abs): 0 10*3/uL (ref 0.0–0.1)
LYMPHS ABS: 3.6 10*3/uL — AB (ref 0.7–3.1)
Lymphs: 38 %
MCH: 30.4 pg (ref 26.6–33.0)
MCHC: 33.6 g/dL (ref 31.5–35.7)
MCV: 91 fL (ref 79–97)
MONOCYTES: 11 %
Monocytes Absolute: 1 10*3/uL — ABNORMAL HIGH (ref 0.1–0.9)
NEUTROS PCT: 48 %
Neutrophils Absolute: 4.6 10*3/uL (ref 1.4–7.0)
Platelets: 423 10*3/uL — ABNORMAL HIGH (ref 150–379)
RBC: 4.31 x10E6/uL (ref 3.77–5.28)
RDW: 14.1 % (ref 12.3–15.4)
WBC: 9.5 10*3/uL (ref 3.4–10.8)

## 2017-10-23 LAB — RENAL FUNCTION PANEL
ALBUMIN: 4.2 g/dL (ref 3.5–4.8)
BUN/Creatinine Ratio: 23 (ref 12–28)
BUN: 25 mg/dL (ref 8–27)
CALCIUM: 9.7 mg/dL (ref 8.7–10.3)
CHLORIDE: 103 mmol/L (ref 96–106)
CO2: 19 mmol/L — AB (ref 20–29)
CREATININE: 1.09 mg/dL — AB (ref 0.57–1.00)
GFR calc Af Amer: 59 mL/min/{1.73_m2} — ABNORMAL LOW (ref >59)
GFR calc non Af Amer: 51 mL/min/{1.73_m2} — ABNORMAL LOW (ref >59)
Glucose: 83 mg/dL (ref 65–99)
Phosphorus: 3.7 mg/dL (ref 2.5–4.5)
Potassium: 4.8 mmol/L (ref 3.5–5.2)
Sodium: 139 mmol/L (ref 134–144)

## 2017-10-26 LAB — PATHOLOGIST SMEAR REVIEW
Basophils Absolute: 0 10*3/uL (ref 0.0–0.2)
Basos: 0 %
EOS (ABSOLUTE): 0.3 10*3/uL (ref 0.0–0.4)
Eos: 3 %
Hematocrit: 39 % (ref 34.0–46.6)
Hemoglobin: 13.1 g/dL (ref 11.1–15.9)
Immature Grans (Abs): 0 10*3/uL (ref 0.0–0.1)
Immature Granulocytes: 0 %
LYMPHS ABS: 3.6 10*3/uL — AB (ref 0.7–3.1)
Lymphs: 38 %
MCH: 30.4 pg (ref 26.6–33.0)
MCHC: 33.6 g/dL (ref 31.5–35.7)
MCV: 91 fL (ref 79–97)
MONOCYTES: 11 %
MONOS ABS: 1 10*3/uL — AB (ref 0.1–0.9)
NEUTROS ABS: 4.6 10*3/uL (ref 1.4–7.0)
Neutrophils: 48 %
PATH REV RBC: NORMAL
PATH REV WBC: NORMAL
Platelets: 423 10*3/uL — ABNORMAL HIGH (ref 150–379)
RBC: 4.31 x10E6/uL (ref 3.77–5.28)
RDW: 14.1 % (ref 12.3–15.4)
WBC: 9.5 10*3/uL (ref 3.4–10.8)

## 2017-10-26 LAB — SPECIMEN STATUS REPORT

## 2017-11-01 DIAGNOSIS — E782 Mixed hyperlipidemia: Secondary | ICD-10-CM | POA: Diagnosis not present

## 2017-11-05 ENCOUNTER — Encounter: Payer: Self-pay | Admitting: Physician Assistant

## 2017-11-15 DIAGNOSIS — E782 Mixed hyperlipidemia: Secondary | ICD-10-CM | POA: Diagnosis not present

## 2017-11-15 DIAGNOSIS — R1031 Right lower quadrant pain: Secondary | ICD-10-CM | POA: Diagnosis not present

## 2017-11-15 DIAGNOSIS — K219 Gastro-esophageal reflux disease without esophagitis: Secondary | ICD-10-CM | POA: Diagnosis not present

## 2017-11-15 DIAGNOSIS — K581 Irritable bowel syndrome with constipation: Secondary | ICD-10-CM | POA: Diagnosis not present

## 2017-11-15 DIAGNOSIS — K222 Esophageal obstruction: Secondary | ICD-10-CM | POA: Diagnosis not present

## 2017-11-16 ENCOUNTER — Other Ambulatory Visit (INDEPENDENT_AMBULATORY_CARE_PROVIDER_SITE_OTHER): Payer: Medicare Other

## 2017-11-16 DIAGNOSIS — E1165 Type 2 diabetes mellitus with hyperglycemia: Secondary | ICD-10-CM

## 2017-11-16 DIAGNOSIS — E559 Vitamin D deficiency, unspecified: Secondary | ICD-10-CM

## 2017-11-16 LAB — COMPREHENSIVE METABOLIC PANEL
ALT: 10 U/L (ref 0–35)
AST: 12 U/L (ref 0–37)
Albumin: 3.7 g/dL (ref 3.5–5.2)
Alkaline Phosphatase: 78 U/L (ref 39–117)
BILIRUBIN TOTAL: 0.4 mg/dL (ref 0.2–1.2)
BUN: 21 mg/dL (ref 6–23)
CHLORIDE: 109 meq/L (ref 96–112)
CO2: 25 meq/L (ref 19–32)
CREATININE: 1.05 mg/dL (ref 0.40–1.20)
Calcium: 9 mg/dL (ref 8.4–10.5)
GFR: 54.76 mL/min — ABNORMAL LOW (ref >60.00)
GLUCOSE: 135 mg/dL — AB (ref 70–99)
Potassium: 4.2 mEq/L (ref 3.5–5.1)
SODIUM: 141 meq/L (ref 135–145)
Total Protein: 6.3 g/dL (ref 6.0–8.3)

## 2017-11-16 LAB — HEMOGLOBIN A1C: HEMOGLOBIN A1C: 7.1 % — AB (ref 4.6–6.5)

## 2017-11-16 LAB — MICROALBUMIN / CREATININE URINE RATIO
Creatinine,U: 92.4 mg/dL
MICROALB/CREAT RATIO: 0.8 mg/g (ref 0.0–30.0)
Microalb, Ur: <0.7 mg/dL (ref 0.0–1.9)

## 2017-11-16 LAB — VITAMIN D 25 HYDROXY (VIT D DEFICIENCY, FRACTURES): VITD: 26.25 ng/mL — ABNORMAL LOW (ref 30.00–100.00)

## 2017-11-21 ENCOUNTER — Encounter: Payer: Self-pay | Admitting: Endocrinology

## 2017-11-21 ENCOUNTER — Ambulatory Visit (INDEPENDENT_AMBULATORY_CARE_PROVIDER_SITE_OTHER): Payer: Medicare Other | Admitting: Endocrinology

## 2017-11-21 VITALS — BP 128/70 | HR 68 | Ht 63.0 in | Wt 174.4 lb

## 2017-11-21 DIAGNOSIS — E559 Vitamin D deficiency, unspecified: Secondary | ICD-10-CM

## 2017-11-21 DIAGNOSIS — M810 Age-related osteoporosis without current pathological fracture: Secondary | ICD-10-CM | POA: Diagnosis not present

## 2017-11-21 DIAGNOSIS — E1165 Type 2 diabetes mellitus with hyperglycemia: Secondary | ICD-10-CM | POA: Diagnosis not present

## 2017-11-21 DIAGNOSIS — E7800 Pure hypercholesterolemia, unspecified: Secondary | ICD-10-CM

## 2017-11-21 NOTE — Progress Notes (Signed)
Patient ID: Alison Friedman, female   DOB: 1946/05/14, 72 y.o.   MRN: [id number]           Chief complaint: Follow-up for endocrine problems   History of Present Illness:  PROBLEM 1: She had been referred in 2017 for management of osteoporosis  Initial history as follows: She had lost about 1 inch in height over the years She had periodic screening bone density measurements done and these have showed the following T-scores on the last measurement in 3/17:  Femoral neck: Measurements  -2.6 and -2.5 Spine: -2.1 FRAX fracture risk at the hip is not calculated    Apparently her bone density has declined 7-10% at various sites  From prior measurements  She has no history of low trauma fracture or height loss  Age at menopause: 40-42 years.  She took hormone replacement only for a few months and has not had a hysterectomy Other risk factors: Prior history of smoking, she stopped in 2008 Positive family history of osteoporosis including her sister, mother and grandmother  Calcium supplements: None, she has intolerance to these because of constipation even if using calcium citrate or chewable calcium.  She does have some dairy products like yogurt and cheese and her diet and recently more milk. Exercise: She tries to walk as much as possible especially at her work  RECENT history: Current treatment: Actonel 35 mg weekly  Vitamin D supplements: D3, 2000 units daily  She was prescribed Actonel 150 mg weekly in 5/17 but because of an episode of flulike symptoms and diarrhea she did not continue this However she has been able to tolerate the 35 mg weekly dose since her visit in 5/18 This has not caused any GI side effects or other symptoms  No recent back pain Although she thinks she is taking her vitamin D as above regularly her level is lower than usual  LABS:  Vitamin D levels  Lab Results  Component Value Date   VD25OH 26.25 (L) 11/16/2017   VD25OH 36.32 11/17/2016     DIABETES: See review of systems   Past Medical History:  Diagnosis Date  . Abnormal CT scan, chest    stable  . Allergy   . Anxiety   . Asthma   . Asthma   . Cervical disc disease   . Diabetes mellitus   . Diabetes mellitus without complication ([place])   . Discoid lupus   . Gallbladder polyp   . Hyperlipidemia   . Hypertension   . Lymphadenopathy   . Postmenopausal HRT (hormone replacement therapy)   . Pulmonary embolism ([place]) At age 51    With associated DVT  . Renal cyst   . Shingles 12/2010    Past Surgical History:  Procedure Laterality Date  . APPENDECTOMY     child  . APPENDECTOMY    . BREAST SURGERY    . LAPAROSCOPIC HELLER MYOTOMY    . NECK SURGERY     Disk and rod  . SPINE SURGERY  2004   c spine  . TONSILLECTOMY AND ADENOIDECTOMY    . TUBAL LIGATION      Family History  Problem Relation Age of Onset  . Osteoporosis Mother   . Osteoporosis Sister   . Breast cancer Daughter 11  . Dementia Maternal Grandmother   . Stroke Maternal Grandfather     Social History:  reports that she has been smoking cigarettes.  She has a 8.75 pack-year smoking history. She has quit using smokeless tobacco. She  reports that she does not drink alcohol or use drugs.  Allergies:  Allergies  Allergen Reactions  . Nitrofurantoin Monohyd Macro Anaphylaxis  . Penicillins Anaphylaxis  . Shellfish Allergy Anaphylaxis  . Tuna [Fish Allergy] Anaphylaxis  . Clarithromycin Other (See Comments)    Face turns red.  . Crestor [Rosuvastatin]   . Food     tuna  . Iodinated Diagnostic Agents     Prev CT reports state anaphylaxis with IV contrast, pt also states severe reaction with contrast  . Levofloxacin   . Levofloxacin Other (See Comments)    Face turned red.  . Lipitor [Atorvastatin Calcium]   . Lipitor [Atorvastatin Calcium] Other (See Comments)    Muscle pain  . Macrolides And Ketolides   . Nitrofurantoin Monohyd Macro   . Penicillins   . Percocet  [Oxycodone-Acetaminophen]   . Percocet [Oxycodone-Acetaminophen] Swelling  . Sulfa Antibiotics   . Sulfa Antibiotics Swelling  . Tuna [Fish Allergy] Hives    Allergies as of 11/21/2017      Reactions   Nitrofurantoin Monohyd Macro Anaphylaxis   Penicillins Anaphylaxis   Shellfish Allergy Anaphylaxis   Blain Pais Allergy] Anaphylaxis   Clarithromycin Other (See Comments)   Face turns red.   Crestor [rosuvastatin]    Food    tuna   Iodinated Diagnostic Agents    Prev CT reports state anaphylaxis with IV contrast, pt also states severe reaction with contrast   Levofloxacin    Levofloxacin Other (See Comments)   Face turned red.   Lipitor [atorvastatin Calcium]    Lipitor [atorvastatin Calcium] Other (See Comments)   Muscle pain   Macrolides And Ketolides    Nitrofurantoin Monohyd Macro    Penicillins    Percocet [oxycodone-acetaminophen]    Percocet [oxycodone-acetaminophen] Swelling   Sulfa Antibiotics    Sulfa Antibiotics Swelling   Tuna [fish Allergy] Hives      Medication List        Accurate as of 11/21/17  9:44 AM. Always use your most recent med list.          albuterol 108 (90 Base) MCG/ACT inhaler Commonly known as:  PROVENTIL HFA;VENTOLIN HFA Inhale 2 puffs into the lungs every 6 (six) hours as needed.   albuterol 108 (90 Base) MCG/ACT inhaler Commonly known as:  VENTOLIN HFA INHALE TWO PUFFS BY MOUTH EVERY 4 HOURS AS NEEDED FOR WHEEZING OR SHORTNESS OF BREATH   ALPRAZolam 0.25 MG tablet Commonly known as:  XANAX Take 0.5 mg by mouth every 6 (six) hours as needed.   ALPRAZolam 0.25 MG tablet Commonly known as:  XANAX Take 0.5-1 tablets (0.125-0.25 mg total) by mouth daily as needed for anxiety.   aspirin EC 81 MG tablet Take 81 mg by mouth daily.   cholecalciferol 1000 units tablet Commonly known as:  VITAMIN D Take 2,000 Units by mouth 2 (two) times daily.   dapagliflozin propanediol 5 MG Tabs tablet Commonly known as:  FARXIGA Take 5 mg by  mouth daily.   dicyclomine 20 MG tablet Commonly known as:  BENTYL TAKE ONE TABLET BY MOUTH EVERY 6 HOURS   famotidine 20 MG tablet Commonly known as:  PEPCID Take 20 mg by mouth 2 (two) times daily.   fluticasone 50 MCG/ACT nasal spray Commonly known as:  FLONASE Place 2 sprays into the nose daily.   fluticasone 50 MCG/ACT nasal spray Commonly known as:  FLONASE USE TWO SPRAY(S) IN EACH NOSTRIL ONCE DAILY   glucose blood test strip Commonly known as:  ONETOUCH VERIO Use as instructed to check blood sugar once a day at various times-Dx code E11.9   ibuprofen 200 MG tablet Commonly known as:  ADVIL,MOTRIN Take 400 mg by mouth once.   metFORMIN 500 MG tablet Commonly known as:  GLUCOPHAGE Take 500 mg by mouth 2 (two) times daily with a meal.   metFORMIN 500 MG 24 hr tablet Commonly known as:  GLUCOPHAGE-XR Take 2 tablets (1,000 mg total) by mouth 2 (two) times daily.   REPATHA Greer Inject into the skin.   risedronate 35 MG tablet Commonly known as:  ACTONEL Take 1 tablet (35 mg total) by mouth every 7 (seven) days. with water on empty stomach, nothing by mouth or lie down for next 30 minutes.   sitaGLIPtin 100 MG tablet Commonly known as:  JANUVIA Take 100 mg by mouth daily.   telmisartan 40 MG tablet Commonly known as:  MICARDIS Take 40 mg by mouth daily.   telmisartan 40 MG tablet Commonly known as:  MICARDIS Take 1 tablet (40 mg total) by mouth daily.        Review of Systems   LIPIDS: She is on Repatha from her cardiologist and has had excellent control  DIABETES: She has type 2 diabetes diagnosed in 2003.   Has been on metformin 1000 mg ER twice a day by her PCP and this has been continued unchanged Recently with her A1c being higher her PCP has asked her to start [place] but she was hesitant to start this because of fear of getting UTI  She checks her blood sugars occasionally with a One Touch monitor:  Checking blood sugar at different times of  the day with a range of 78-264 and some readings after meals Has sporadic high readings after meals Fasting readings are averaging about 120 Lab glucose 135 fasting  OVERALL median blood sugar is 116+/-39  Previously she had a consultation with the dietitian in 6/18  She is fairly active and able to walk regularly However has not been able to lose weight  A1c as follows:  Wt Readings from Last 3 Encounters:  11/21/17 174 lb 6.4 oz (79.1 kg)  10/22/17 178 lb (80.7 kg)  07/18/17 174 lb (78.9 kg)    Lab Results  Component Value Date   HGBA1C 7.1 (H) 11/16/2017   HGBA1C 7.1 10/22/2017   HGBA1C 6.4 05/24/2017   Lab Results  Component Value Date   MICROALBUR <0.7 11/16/2017   [place] 93 07/01/2015   CREATININE 1.05 11/16/2017    LABS:  Lab on 11/16/2017  Component Date Value Ref Range Status  . Microalb, Ur 11/16/2017 <0.7  0.0 - 1.9 mg/dL Final  . Creatinine,U 11/16/2017 92.4  mg/dL Final  . Microalb Creat Ratio 11/16/2017 0.8  0.0 - 30.0 mg/g Final  . VITD 11/16/2017 26.25* 30.00 - 100.00 ng/mL Final  . Hgb A1c MFr Bld 11/16/2017 7.1* 4.6 - 6.5 % Final   Glycemic Control Guidelines for People with Diabetes:Non Diabetic:  <6%Goal of Therapy: <7%Additional Action Suggested:  >8%   . Sodium 11/16/2017 141  135 - 145 mEq/L Final  . Potassium 11/16/2017 4.2  3.5 - 5.1 mEq/L Final  . Chloride 11/16/2017 109  96 - 112 mEq/L Final  . CO2 11/16/2017 25  19 - 32 mEq/L Final  . Glucose, Bld 11/16/2017 135* 70 - 99 mg/dL Final  . BUN 11/16/2017 21  6 - 23 mg/dL Final  . Creatinine, Ser 11/16/2017 1.05  0.40 - 1.20 mg/dL Final  .  Total Bilirubin 11/16/2017 0.4  0.2 - 1.2 mg/dL Final  . Alkaline Phosphatase 11/16/2017 78  39 - 117 U/L Final  . AST 11/16/2017 12  0 - 37 U/L Final  . ALT 11/16/2017 10  0 - 35 U/L Final  . Total Protein 11/16/2017 6.3  6.0 - 8.3 g/dL Final  . Albumin 11/16/2017 3.7  3.5 - 5.2 g/dL Final  . Calcium 11/16/2017 9.0  8.4 - 10.5 mg/dL Final  . GFR  11/16/2017 54.76* >60.00 mL/min Final     PHYSICAL EXAM:  BP 128/70 (BP Location: Left Arm, Patient Position: Sitting, Cuff Size: Normal)   Pulse 68   Ht 5\' 3"  (1.6 m)   Wt 174 lb 6.4 oz (79.1 kg)   SpO2 98%   BMI 30.89 kg/m      ASSESSMENT:   OSTEOPOROSIS with low T score of the femoral neck previously declining bone density  She is taking Actonel 35 mg weekly for the last 12 months or so without side effects Vitamin D level appears to be low and she needs to double her dose of vitamin D from the 2000 units that she is taking  Will order a bone density also   Diabetes: She has an A1c of 7.1 although deviously better Reassured her that [place]ade is safe to take and she can start this and she will have benefits of weight loss and potentially reduced cardiovascular events May have mild side effects of candidiasis or UTI which is rare She will continue metformin also and follow-up with PCP in July  LIPIDS: She is asking about continuing Repatha because of the reported side effects of potentially making the blood sugar go up We discussed that the benefits are much greater than any potential risks and she is already getting management of her diabetes adequately   PLAN:    She will need to continue Actonel weekly  We will discuss bone density results when available  Diabetes management as above:  Total visit time for evaluation and management of multiple problems and counseling =25 minutes  Elayne Snare 11/21/2017, 9:44 AM

## 2017-11-21 NOTE — Patient Instructions (Addendum)
Take 10-4998 Vitamin D3  Calcium should be 1000mg   With farxiga take 1/2 Micardis

## 2017-11-27 DIAGNOSIS — E119 Type 2 diabetes mellitus without complications: Secondary | ICD-10-CM | POA: Diagnosis not present

## 2017-11-27 DIAGNOSIS — H2513 Age-related nuclear cataract, bilateral: Secondary | ICD-10-CM | POA: Diagnosis not present

## 2017-11-27 LAB — HM DIABETES EYE EXAM

## 2017-11-29 DIAGNOSIS — E782 Mixed hyperlipidemia: Secondary | ICD-10-CM | POA: Diagnosis not present

## 2017-12-20 DIAGNOSIS — Z86010 Personal history of colonic polyps: Secondary | ICD-10-CM | POA: Diagnosis not present

## 2017-12-20 DIAGNOSIS — K219 Gastro-esophageal reflux disease without esophagitis: Secondary | ICD-10-CM | POA: Diagnosis not present

## 2017-12-20 DIAGNOSIS — K581 Irritable bowel syndrome with constipation: Secondary | ICD-10-CM | POA: Diagnosis not present

## 2017-12-20 DIAGNOSIS — R1031 Right lower quadrant pain: Secondary | ICD-10-CM | POA: Diagnosis not present

## 2017-12-20 DIAGNOSIS — K222 Esophageal obstruction: Secondary | ICD-10-CM | POA: Diagnosis not present

## 2017-12-24 ENCOUNTER — Encounter: Payer: Self-pay | Admitting: *Deleted

## 2018-01-10 ENCOUNTER — Encounter: Payer: Self-pay | Admitting: Physician Assistant

## 2018-01-10 ENCOUNTER — Ambulatory Visit (INDEPENDENT_AMBULATORY_CARE_PROVIDER_SITE_OTHER): Payer: Medicare Other | Admitting: Physician Assistant

## 2018-01-10 ENCOUNTER — Other Ambulatory Visit: Payer: Self-pay

## 2018-01-10 VITALS — BP 130/80 | HR 67 | Temp 98.5°F | Resp 16 | Ht 62.0 in | Wt 172.4 lb

## 2018-01-10 DIAGNOSIS — J22 Unspecified acute lower respiratory infection: Secondary | ICD-10-CM | POA: Diagnosis not present

## 2018-01-10 DIAGNOSIS — R350 Frequency of micturition: Secondary | ICD-10-CM | POA: Diagnosis not present

## 2018-01-10 LAB — POCT URINALYSIS DIP (MANUAL ENTRY)
BILIRUBIN UA: NEGATIVE
BILIRUBIN UA: NEGATIVE mg/dL
GLUCOSE UA: NEGATIVE mg/dL
NITRITE UA: NEGATIVE
PH UA: 5 (ref 5.0–8.0)
Protein Ur, POC: NEGATIVE mg/dL
Spec Grav, UA: <=1.005 — AB (ref 1.010–1.025)
Urobilinogen, UA: 0.2 E.U./dL

## 2018-01-10 LAB — POC MICROSCOPIC URINALYSIS (UMFC): MUCUS RE: ABSENT

## 2018-01-10 MED ORDER — DOXYCYCLINE HYCLATE 100 MG PO CAPS: 100.0000 mg | ORAL_CAPSULE | Freq: Two times a day (BID) | ORAL | 0 refills | Status: AC

## 2018-01-10 MED ORDER — HYDROCOD POLST-CPM POLST ER 10-8 MG/5ML PO SUER: 5.0000 mL | Freq: Two times a day (BID) | ORAL | 0 refills | Status: DC | PRN

## 2018-01-10 NOTE — Patient Instructions (Addendum)
     IF you received an x-ray today, you will receive an invoice from [facility]. Please contact [facility] at [phone] with questions or concerns regarding your invoice.   IF you received labwork today, you will receive an invoice from [facility]. Please contact [facility] at [phone] with questions or concerns regarding your invoice.   Our billing staff will not be able to assist you with questions regarding bills from these companies.  You will be contacted with the lab results as soon as they are available. The fastest way to get your results is to activate your My Chart account. Instructions are located on the last page of this paperwork. If you have not heard from us regarding the results in 2 weeks, please contact this office.     

## 2018-01-10 NOTE — Progress Notes (Signed)
01/10/2018 2:52 PM   DOB: 05-Nov-1945 / MRN: [id number]  SUBJECTIVE:  Alison Friedman is a 73 y.o. female presenting for cough. Symptoms present for about 1 week.  The problem is worsening. She has tried tussionex with good relief. She is a diabetic and has a history of asthma.   She tells me that she had to stop taking the SGLT2 inhibitor secondary to the frequency and urgency.  This was started at a medium dose because her diabetes was running out of control.  She states that she has been working hard at losing weight and getting enough exercise.  Tells me that she has had some mild dysuria since stopping the medication.  She is allergic to nitrofurantoin monohyd macro; penicillins; shellfish allergy; tuna [fish allergy]; clarithromycin; crestor [rosuvastatin]; food; iodinated diagnostic agents; levofloxacin; levofloxacin; lipitor [atorvastatin calcium]; lipitor [atorvastatin calcium]; macrolides and ketolides; nitrofurantoin monohyd macro; penicillins; percocet [oxycodone-acetaminophen]; percocet [oxycodone-acetaminophen]; sulfa antibiotics; sulfa antibiotics; and tuna [fish allergy].   She  has a past medical history of Abnormal CT scan, chest, Allergy, Anxiety, Asthma, Asthma, Cervical disc disease, Diabetes mellitus, Diabetes mellitus without complication ([place]), Discoid lupus, Gallbladder polyp, Hyperlipidemia, Hypertension, Lymphadenopathy, Postmenopausal HRT (hormone replacement therapy), Pulmonary embolism ([place]) (At age 89 ), Renal cyst, and Shingles (12/2010).    She  reports that she has been smoking cigarettes.  She has a 8.75 pack-year smoking history. She has quit using smokeless tobacco. She reports that she does not drink alcohol or use drugs. She  reports that she currently engages in sexual activity. She reports using the following method of birth control/protection: None. The patient  has a past surgical history that includes Spine surgery (2004); Appendectomy; Tonsillectomy and  adenoidectomy; Laparoscopic Heller myotomy; Appendectomy; Tubal ligation; Breast surgery; and Neck surgery.  Her family history includes Breast cancer (age of onset: 69) in her daughter; Dementia in her maternal grandmother; Osteoporosis in her mother and sister; Stroke in her maternal grandfather.  Review of Systems  Constitutional: Negative for chills, diaphoresis and fever.  Eyes: Negative.   Respiratory: Positive for cough and sputum production (thick and green). Negative for hemoptysis, shortness of breath and wheezing.   Cardiovascular: Negative for chest pain.  Gastrointestinal: Negative for abdominal pain, blood in stool, constipation, diarrhea, heartburn, melena, nausea and vomiting.  Genitourinary: Positive for urgency. Negative for dysuria, flank pain, frequency and hematuria.  Musculoskeletal: Negative for myalgias.  Skin: Negative for rash.  Neurological: Negative for dizziness, sensory change, speech change, focal weakness and headaches.    The problem list and medications were reviewed and updated by myself where necessary and exist elsewhere in the encounter.   OBJECTIVE:  BP 130/80 (BP Location: Left Arm, Patient Position: Sitting, Cuff Size: Normal)   Pulse 67   Temp 98.5 F (36.9 C) (Oral)   Resp 16   Ht 5\' 2"  (1.575 m)   Wt 172 lb 6.4 oz (78.2 kg)   SpO2 97%   BMI 31.53 kg/m   Wt Readings from Last 3 Encounters:  01/10/18 172 lb 6.4 oz (78.2 kg)  11/21/17 174 lb 6.4 oz (79.1 kg)  10/22/17 178 lb (80.7 kg)   Temp Readings from Last 3 Encounters:  01/10/18 98.5 F (36.9 C) (Oral)  07/18/17 98.1 F (36.7 C) (Oral)  05/03/17 99 F (37.2 C) (Oral)   BP Readings from Last 3 Encounters:  01/10/18 130/80  11/21/17 128/70  10/22/17 (!) 144/86   Pulse Readings from Last 3 Encounters:  01/10/18 67  11/21/17 68  10/22/17 70    Physical Exam  Constitutional: She is oriented to person, place, and time. She appears well-nourished.  Non-toxic appearance. No  distress.  HENT:  Right Ear: External ear normal.  Left Ear: External ear normal.  Nose: Mucosal edema present. Right sinus exhibits no maxillary sinus tenderness and no frontal sinus tenderness. Left sinus exhibits no maxillary sinus tenderness and no frontal sinus tenderness.  Mouth/Throat: Oropharynx is clear and moist. No oropharyngeal exudate.  Eyes: Pupils are equal, round, and reactive to light. Conjunctivae and EOM are normal.  Cardiovascular: Normal rate, regular rhythm, S1 normal, S2 normal, normal heart sounds and intact distal pulses. Exam reveals no gallop, no friction rub and no decreased pulses.  No murmur heard. Pulmonary/Chest: Effort normal and breath sounds normal. No stridor. No respiratory distress. She has no wheezes. She has no rales.  Abdominal: She exhibits no distension.  Musculoskeletal: She exhibits no edema.  Neurological: She is alert and oriented to person, place, and time. No cranial nerve deficit. Gait normal.  Skin: Skin is warm and dry. No rash noted. She is not diaphoretic. No erythema. No pallor.  Psychiatric: She has a normal mood and affect. Her behavior is normal.  Vitals reviewed.   Lab Results  Component Value Date   HGBA1C 7.1 (H) 11/16/2017    Lab Results  Component Value Date   WBC 9.5 10/22/2017   WBC 9.5 10/22/2017   HGB 13.1 10/22/2017   HGB 13.1 10/22/2017   HCT 39.0 10/22/2017   HCT 39.0 10/22/2017   MCV 91 10/22/2017   MCV 91 10/22/2017   PLT 423 (H) 10/22/2017   PLT 423 (H) 10/22/2017    Lab Results  Component Value Date   CREATININE 1.05 11/16/2017   BUN 21 11/16/2017   NA 141 11/16/2017   K 4.2 11/16/2017   CL 109 11/16/2017   CO2 25 11/16/2017    Lab Results  Component Value Date   ALT 10 11/16/2017   AST 12 11/16/2017   ALKPHOS 78 11/16/2017   BILITOT 0.4 11/16/2017    Lab Results  Component Value Date   TSH 4.17 11/24/2015    Lab Results  Component Value Date   CHOL 181 07/01/2015   HDL 46  07/01/2015   LDLCALC 93 07/01/2015   TRIG 209 (H) 07/01/2015   CHOLHDL 3.9 07/01/2015   Results for orders placed or performed in visit on 01/10/18  POCT urinalysis dipstick  Result Value Ref Range   Color, UA yellow yellow   Clarity, UA clear clear   Glucose, UA negative negative mg/dL   Bilirubin, UA negative negative   Ketones, POC UA negative negative mg/dL   Spec Grav, UA <=1.005 (A) 1.010 - 1.025   Blood, UA trace-lysed (A) negative   pH, UA 5.0 5.0 - 8.0   Protein Ur, POC negative negative mg/dL   Urobilinogen, UA 0.2 0.2 or 1.0 E.U./dL   Nitrite, UA Negative Negative   Leukocytes, UA Small (1+) (A) Negative  POCT Microscopic Urinalysis ([facility])  Result Value Ref Range   WBC,UR,HPF,POC Many (A) None WBC/hpf   RBC,UR,HPF,POC Few (A) None RBC/hpf   Bacteria Moderate (A) None, Too numerous to count   Mucus Absent Absent   Epithelial Cells, UR Per Microscopy Moderate (A) None, Too numerous to count cells/hpf     ASSESSMENT AND PLAN:  Desirey was seen today for cough, urinary burning and urinary frequency.  Diagnoses and all orders for this visit:  Urinary frequency it looks as  if her urine is contaminated with epithelials.:  I will culture and await treatment. -     POCT urinalysis dipstick -     POCT Microscopic Urinalysis ([facility]) -     Urine Culture  LRTI (lower respiratory tract infection): Diabetic with a history of asthma and age greater than 84.  She had doxycycline before without any side effects.  Will cover for an atypical.  Fortunately exam and vital signs within normal limits today. -     doxycycline (VIBRAMYCIN) 100 MG capsule; Take 1 capsule (100 mg total) by mouth 2 (two) times daily for 10 days. -     chlorpheniramine-HYDROcodone (TUSSIONEX PENNKINETIC ER) 10-8 MG/5ML SUER; Take 5 mLs by mouth every 12 (twelve) hours as needed for cough.    The patient is advised to call or return to clinic if she does not see an improvement in symptoms, or to seek the care  of the closest emergency department if she worsens with the above plan.   Philis Fendt, MHS, PA-C [facility] 01/10/2018 2:52 PM

## 2018-01-12 LAB — URINE CULTURE

## 2018-01-13 ENCOUNTER — Other Ambulatory Visit: Payer: Self-pay | Admitting: Physician Assistant

## 2018-01-13 MED ORDER — FOSFOMYCIN TROMETHAMINE 3 G PO PACK: 3.0000 g | PACK | Freq: Once | ORAL | 0 refills | Status: AC

## 2018-01-21 ENCOUNTER — Ambulatory Visit (INDEPENDENT_AMBULATORY_CARE_PROVIDER_SITE_OTHER): Payer: Medicare Other | Admitting: Physician Assistant

## 2018-01-21 ENCOUNTER — Other Ambulatory Visit: Payer: Self-pay

## 2018-01-21 ENCOUNTER — Encounter: Payer: Self-pay | Admitting: Physician Assistant

## 2018-01-21 VITALS — BP 96/62 | HR 68 | Temp 98.6°F | Resp 16 | Ht 62.0 in | Wt 171.8 lb

## 2018-01-21 DIAGNOSIS — E119 Type 2 diabetes mellitus without complications: Secondary | ICD-10-CM

## 2018-01-21 LAB — POCT GLYCOSYLATED HEMOGLOBIN (HGB A1C): HEMOGLOBIN A1C: 7 % — AB (ref 4.0–5.6)

## 2018-01-21 NOTE — Patient Instructions (Addendum)
Continue working on [employer] and get lots of exericse.   On another note I will be leaving the practice in the end of August 2019 and will be moving back to the [place] area.  It was a pleasure to get to know you and serve you medically.  Please continue to take your medications.  I am happy to help you transition to another provider in the practice. I am happy to see you and Cherlynn Kaiser and my new practice at [facility] in [place].      IF you received an x-ray today, you will receive an invoice from [facility]. Please contact [facility] at [phone] with questions or concerns regarding your invoice.   IF you received labwork today, you will receive an invoice from [facility]. Please contact [facility] at [phone] with questions or concerns regarding your invoice.   Our billing staff will not be able to assist you with questions regarding bills from these companies.  You will be contacted with the lab results as soon as they are available. The fastest way to get your results is to activate your My Chart account. Instructions are located on the last page of this paperwork. If you have not heard from [place] regarding the results in 2 weeks, please contact this office.

## 2018-01-21 NOTE — Progress Notes (Signed)
01/21/2018 4:37 PM   DOB: 05-13-1946 / MRN: [id number]  SUBJECTIVE:  Alison Friedman is a 72 y.o. female presenting for diabetes recheck. Symptoms present for years. Last I checked she was becoming uncontrolled with an A1c of 7.1.  She elected to start new meds but later took herself off in favor of lifestyle modification. She feels well today. She is taking repatha given she did not tolerate statins. She takes ASA daily. She takes ARB. Last renal panel normal.  Eye exam current without retinopathy.    Current Outpatient Medications:  .  albuterol (VENTOLIN HFA) 108 (90 Base) MCG/ACT inhaler, INHALE TWO PUFFS BY MOUTH EVERY 4 HOURS AS NEEDED FOR WHEEZING OR SHORTNESS OF BREATH, Disp: 18 each, Rfl: 3 .  ALPRAZolam (XANAX) 0.25 MG tablet, Take 0.5-1 tablets (0.125-0.25 mg total) by mouth daily as needed for anxiety., Disp: 30 tablet, Rfl: 1 .  aspirin EC 81 MG tablet, Take 81 mg by mouth daily., Disp: , Rfl:  .  cholecalciferol (VITAMIN D) 1000 units tablet, Take 2,000 Units by mouth 2 (two) times daily., Disp: , Rfl:  .  dicyclomine (BENTYL) 20 MG tablet, TAKE ONE TABLET BY MOUTH EVERY 6 HOURS, Disp: 40 tablet, Rfl: 4 .  Evolocumab (REPATHA [place]), Inject into the skin., Disp: , Rfl:  .  famotidine (PEPCID) 20 MG tablet, Take 20 mg by mouth 2 (two) times daily., Disp: , Rfl:  .  fluticasone (FLONASE) 50 MCG/ACT nasal spray, USE TWO SPRAY(S) IN EACH NOSTRIL ONCE DAILY, Disp: 16 g, Rfl: 4 .  glucose blood (ONETOUCH VERIO) test strip, Use as instructed to check blood sugar once a day at various times-Dx code E11.9, Disp: 50 each, Rfl: 1 .  metFORMIN (GLUCOPHAGE-XR) 500 MG 24 hr tablet, Take 2 tablets (1,000 mg total) by mouth 2 (two) times daily., Disp: 360 tablet, Rfl: 3 .  risedronate (ACTONEL) 35 MG tablet, Take 1 tablet (35 mg total) by mouth every 7 (seven) days. with water on empty stomach, nothing by mouth or lie down for next 30 minutes., Disp: 4 tablet, Rfl: 3 .  telmisartan (MICARDIS) 40 MG  tablet, Take 1 tablet (40 mg total) by mouth daily., Disp: 90 tablet, Rfl: 3   She is allergic to nitrofurantoin monohyd macro; penicillins; shellfish allergy; tuna [fish allergy]; clarithromycin; crestor [rosuvastatin]; food; iodinated diagnostic agents; levofloxacin; levofloxacin; lipitor [atorvastatin calcium]; lipitor [atorvastatin calcium]; macrolides and ketolides; nitrofurantoin monohyd macro; penicillins; percocet [oxycodone-acetaminophen]; percocet [oxycodone-acetaminophen]; sulfa antibiotics; sulfa antibiotics; and tuna [fish allergy].   She  has a past medical history of Abnormal CT scan, chest, Allergy, Anxiety, Asthma, Asthma, Cervical disc disease, Diabetes mellitus, Diabetes mellitus without complication ([place]), Discoid lupus, Gallbladder polyp, Hyperlipidemia, Hypertension, Lymphadenopathy, Postmenopausal HRT (hormone replacement therapy), Pulmonary embolism ([place]) (At age 45 ), Renal cyst, and Shingles (12/2010).    She  reports that she has been smoking cigarettes.  She has a 8.75 pack-year smoking history. She has quit using smokeless tobacco. She reports that she does not drink alcohol or use drugs. She  reports that she currently engages in sexual activity. She reports using the following method of birth control/protection: None. The patient  has a past surgical history that includes Spine surgery (2004); Appendectomy; Tonsillectomy and adenoidectomy; Laparoscopic Heller myotomy; Appendectomy; Tubal ligation; Breast surgery; and Neck surgery.  Her family history includes Breast cancer (age of onset: 70) in her daughter; Dementia in her maternal grandmother; Osteoporosis in her mother and sister; Stroke in her maternal grandfather.  Review of  Systems  Constitutional: Negative for diaphoresis.  Eyes: Negative.   Respiratory: Negative for cough, hemoptysis, sputum production, shortness of breath and wheezing.   Cardiovascular: Negative for chest pain, orthopnea and leg swelling.    Gastrointestinal: Negative for abdominal pain, blood in stool, constipation, diarrhea, heartburn, melena, nausea and vomiting.  Genitourinary: Negative for dysuria, flank pain, frequency, hematuria and urgency.  Neurological: Negative for dizziness, sensory change, speech change, focal weakness and headaches.    The problem list and medications were reviewed and updated by myself where necessary and exist elsewhere in the encounter.   OBJECTIVE:  BP 96/62   Pulse 68   Temp 98.6 F (37 C)   Resp 16   Ht 5\' 2"  (1.575 m)   Wt 171 lb 12.8 oz (77.9 kg)   SpO2 94%   BMI 31.42 kg/m   Wt Readings from Last 3 Encounters:  01/21/18 171 lb 12.8 oz (77.9 kg)  01/10/18 172 lb 6.4 oz (78.2 kg)  11/21/17 174 lb 6.4 oz (79.1 kg)   Temp Readings from Last 3 Encounters:  01/21/18 98.6 F (37 C)  01/10/18 98.5 F (36.9 C) (Oral)  07/18/17 98.1 F (36.7 C) (Oral)   BP Readings from Last 3 Encounters:  01/21/18 96/62  01/10/18 130/80  11/21/17 128/70   Pulse Readings from Last 3 Encounters:  01/21/18 68  01/10/18 67  11/21/17 68    Physical Exam  Constitutional: She is oriented to person, place, and time. She appears well-nourished. No distress.  Eyes: Pupils are equal, round, and reactive to light. EOM are normal.  Cardiovascular: Normal rate, regular rhythm, S1 normal, S2 normal, normal heart sounds and intact distal pulses. Exam reveals no gallop, no friction rub and no decreased pulses.  No murmur heard. Pulmonary/Chest: Effort normal. No stridor. No respiratory distress. She has no wheezes. She has no rales.  Abdominal: She exhibits no distension.  Musculoskeletal: She exhibits no edema.  Neurological: She is alert and oriented to person, place, and time. No cranial nerve deficit. Gait normal.  Skin: Skin is dry. No rash noted. She is not diaphoretic. No erythema. No pallor.  Psychiatric: She has a normal mood and affect.  Vitals reviewed.   Lab Results  Component Value  Date   HGBA1C 7.0 (A) 01/21/2018    Lab Results  Component Value Date   WBC 9.5 10/22/2017   WBC 9.5 10/22/2017   HGB 13.1 10/22/2017   HGB 13.1 10/22/2017   HCT 39.0 10/22/2017   HCT 39.0 10/22/2017   MCV 91 10/22/2017   MCV 91 10/22/2017   PLT 423 (H) 10/22/2017   PLT 423 (H) 10/22/2017    Lab Results  Component Value Date   CREATININE 1.05 11/16/2017   BUN 21 11/16/2017   NA 141 11/16/2017   K 4.2 11/16/2017   CL 109 11/16/2017   CO2 25 11/16/2017    Lab Results  Component Value Date   ALT 10 11/16/2017   AST 12 11/16/2017   ALKPHOS 78 11/16/2017   BILITOT 0.4 11/16/2017    Lab Results  Component Value Date   TSH 4.17 11/24/2015    Lab Results  Component Value Date   CHOL 181 07/01/2015   HDL 46 07/01/2015   LDLCALC 93 07/01/2015   TRIG 209 (H) 07/01/2015   CHOLHDL 3.9 07/01/2015   Lab Results  Component Value Date   MICROALBUR <0.7 11/16/2017    ASSESSMENT AND PLAN:  Vandella was seen today for diabetes.  Diagnoses and all  orders for this visit:  Type 2 diabetes mellitus without complication, without long-term current use of insulin ([place]): Improving albeit slowly. On ARB and repatha.  -     POCT glycosylated hemoglobin (Hb A1C)     The patient is advised to call or return to clinic if she does not see an improvement in symptoms, or to seek the care of the closest emergency department if she worsens with the above plan.   Philis Fendt, MHS, PA-C [facility] 01/21/2018 4:37 PM

## 2018-01-28 DIAGNOSIS — Z86010 Personal history of colonic polyps: Secondary | ICD-10-CM | POA: Diagnosis not present

## 2018-01-28 DIAGNOSIS — K581 Irritable bowel syndrome with constipation: Secondary | ICD-10-CM | POA: Diagnosis not present

## 2018-01-28 DIAGNOSIS — K222 Esophageal obstruction: Secondary | ICD-10-CM | POA: Diagnosis not present

## 2018-01-28 DIAGNOSIS — K219 Gastro-esophageal reflux disease without esophagitis: Secondary | ICD-10-CM | POA: Diagnosis not present

## 2018-02-12 ENCOUNTER — Other Ambulatory Visit: Payer: Self-pay | Admitting: Endocrinology

## 2018-03-15 ENCOUNTER — Encounter: Payer: Self-pay | Admitting: Physician Assistant

## 2018-03-15 DIAGNOSIS — Z1231 Encounter for screening mammogram for malignant neoplasm of breast: Secondary | ICD-10-CM | POA: Diagnosis not present

## 2018-03-15 DIAGNOSIS — Z803 Family history of malignant neoplasm of breast: Secondary | ICD-10-CM | POA: Diagnosis not present

## 2018-03-15 DIAGNOSIS — M8588 Other specified disorders of bone density and structure, other site: Secondary | ICD-10-CM | POA: Diagnosis not present

## 2018-03-15 DIAGNOSIS — M810 Age-related osteoporosis without current pathological fracture: Secondary | ICD-10-CM | POA: Diagnosis not present

## 2018-04-05 DIAGNOSIS — E782 Mixed hyperlipidemia: Secondary | ICD-10-CM | POA: Diagnosis not present

## 2018-04-08 DIAGNOSIS — Z789 Other specified health status: Secondary | ICD-10-CM | POA: Diagnosis not present

## 2018-04-08 DIAGNOSIS — E782 Mixed hyperlipidemia: Secondary | ICD-10-CM | POA: Diagnosis not present

## 2018-04-08 DIAGNOSIS — E119 Type 2 diabetes mellitus without complications: Secondary | ICD-10-CM | POA: Diagnosis not present

## 2018-04-08 DIAGNOSIS — I2510 Atherosclerotic heart disease of native coronary artery without angina pectoris: Secondary | ICD-10-CM | POA: Diagnosis not present

## 2018-04-13 NOTE — Progress Notes (Signed)
Negative  For malignancy f/u 1 yr

## 2018-04-13 NOTE — Progress Notes (Signed)
Osteoporosis multiple sites T-score: -2.9 STATISTICALLY SIGNIFICANT INCREASE IN BMD AP SPINE, DECREASE IN THE LEFT FEMORAL NECK THE RIGHT FEMORAL NECK IS STABLE

## 2018-04-15 ENCOUNTER — Telehealth: Payer: Self-pay | Admitting: Endocrinology

## 2018-04-15 NOTE — Telephone Encounter (Signed)
Bone density results from 03/15/2018 show slight decrease only on the left hip but otherwise looking better.  Confirm that she is taking the risedronate every week

## 2018-04-16 NOTE — Telephone Encounter (Signed)
Pt is aware and states she is taking her medication weekly

## 2018-04-23 ENCOUNTER — Ambulatory Visit (INDEPENDENT_AMBULATORY_CARE_PROVIDER_SITE_OTHER): Payer: Medicare Other | Admitting: Emergency Medicine

## 2018-04-23 ENCOUNTER — Encounter: Payer: Self-pay | Admitting: Emergency Medicine

## 2018-04-23 ENCOUNTER — Other Ambulatory Visit: Payer: Self-pay

## 2018-04-23 VITALS — BP 154/86 | HR 68 | Temp 98.7°F | Resp 16 | Wt 178.4 lb

## 2018-04-23 DIAGNOSIS — J301 Allergic rhinitis due to pollen: Secondary | ICD-10-CM

## 2018-04-23 DIAGNOSIS — R350 Frequency of micturition: Secondary | ICD-10-CM

## 2018-04-23 DIAGNOSIS — E119 Type 2 diabetes mellitus without complications: Secondary | ICD-10-CM

## 2018-04-23 LAB — POCT URINALYSIS DIP (MANUAL ENTRY)
Bilirubin, UA: NEGATIVE
Glucose, UA: NEGATIVE mg/dL
Leukocytes, UA: NEGATIVE
Nitrite, UA: NEGATIVE
PH UA: 5 (ref 5.0–8.0)
PROTEIN UA: NEGATIVE mg/dL
RBC UA: NEGATIVE
Spec Grav, UA: 1.025 (ref 1.010–1.025)
UROBILINOGEN UA: 0.2 U/dL

## 2018-04-23 MED ORDER — FLUTICASONE PROPIONATE 50 MCG/ACT NA SUSP: NASAL | 4 refills | Status: DC

## 2018-04-23 MED ORDER — TELMISARTAN 40 MG PO TABS: 40.0000 mg | ORAL_TABLET | Freq: Every day | ORAL | 3 refills | Status: DC

## 2018-04-23 MED ORDER — CEPHALEXIN 500 MG PO CAPS: 500.0000 mg | ORAL_CAPSULE | Freq: Three times a day (TID) | ORAL | 0 refills | Status: AC

## 2018-04-23 NOTE — Patient Instructions (Addendum)
     If you have lab work done today you will be contacted with your lab results within the next 2 weeks.  If you have not heard from [place] then please contact us. The fastest way to get your results is to register for My Chart.   IF you received an x-ray today, you will receive an invoice from [facility]. Please contact [facility] at [phone] with questions or concerns regarding your invoice.   IF you received labwork today, you will receive an invoice from [place]. Please contact [facility] at [phone] with questions or concerns regarding your invoice.   Our billing staff will not be able to assist you with questions regarding bills from these companies.  You will be contacted with the lab results as soon as they are available. The fastest way to get your results is to activate your My Chart account. Instructions are located on the last page of this paperwork. If you have not heard from [place] regarding the results in 2 weeks, please contact this office.     Urinary Tract Infection, Adult A urinary tract infection (UTI) is an infection of any part of the urinary tract. The urinary tract includes the:  Kidneys.  Ureters.  Bladder.  Urethra.  These organs make, store, and get rid of pee (urine) in the body. Follow these instructions at home:  Take over-the-counter and prescription medicines only as told by your doctor.  If you were prescribed an antibiotic medicine, take it as told by your doctor. Do not stop taking the antibiotic even if you start to feel better.  Avoid the following drinks: ? Alcohol. ? Caffeine. ? Tea. ? Carbonated drinks.  Drink enough fluid to keep your pee clear or pale yellow.  Keep all follow-up visits as told by your doctor. This is important.  Make sure to: ? Empty your bladder often and completely. Do not to hold pee for long periods of time. ? Empty your bladder before and after sex. ? Wipe from front to back after a  bowel movement if you are female. Use each tissue one time when you wipe. Contact a doctor if:  You have back pain.  You have a fever.  You feel sick to your stomach (nauseous).  You throw up (vomit).  Your symptoms do not get better after 3 days.  Your symptoms go away and then come back. Get help right away if:  You have very bad back pain.  You have very bad lower belly (abdominal) pain.  You are throwing up and cannot keep down any medicines or water. This information is not intended to replace advice given to you by your health care provider. Make sure you discuss any questions you have with your health care provider. Document Released: 12/20/2007 Document Revised: 12/09/2015 Document Reviewed: 05/24/2015 Elsevier Interactive Patient Education  [employer].

## 2018-04-23 NOTE — Progress Notes (Signed)
Alison Friedman 72 y.o.   Chief Complaint  Patient presents with  . Establish Care    medication refill - flonase and MICARDIS  . Urinary Frequency    x 3 days    HISTORY OF PRESENT ILLNESS: This is a 72 y.o. female here to establish care.  First visit with me.  Needs medication refills.  Has a history of hypertension and diabetes.  Chronic smoker. Also has a history of recurrent UTIs complaining of urinary frequency and dysuria for 3 days.  Allergic to most antibiotics but she has taken Keflex before without any problems.  HPI   Prior to Admission medications   Medication Sig Start Date End Date Taking? Authorizing Provider  albuterol (VENTOLIN HFA) 108 (90 Base) MCG/ACT inhaler INHALE TWO PUFFS BY MOUTH EVERY 4 HOURS AS NEEDED FOR WHEEZING OR SHORTNESS OF BREATH 05/03/17  Yes Tereasa Coop, PA-C  ALPRAZolam Duanne Moron) 0.25 MG tablet Take 0.5-1 tablets (0.125-0.25 mg total) by mouth daily as needed for anxiety. 10/22/17  Yes Tereasa Coop, PA-C  aspirin EC 81 MG tablet Take 81 mg by mouth daily.   Yes [provider]  cholecalciferol (VITAMIN D) 1000 units tablet Take 2,000 Units by mouth 2 (two) times daily.   Yes [provider]  dicyclomine (BENTYL) 20 MG tablet TAKE ONE TABLET BY MOUTH EVERY 6 HOURS 04/08/15  Yes Daub, Loura Back, MD  Evolocumab (REPATHA [place]eth) Inject into the skin.   Yes [provider]  famotidine (PEPCID) 20 MG tablet Take 20 mg by mouth 2 (two) times daily.   Yes [provider]  fluticasone (FLONASE) 50 MCG/ACT nasal spray USE TWO SPRAY(S) IN EACH NOSTRIL ONCE DAILY 05/03/17  Yes Tereasa Coop, PA-C  metFORMIN (GLUCOPHAGE-XR) 500 MG 24 hr tablet Take 2 tablets (1,000 mg total) by mouth 2 (two) times daily. 07/18/17  Yes Tereasa Coop, PA-C  risedronate (ACTONEL) 35 MG tablet TAKE ONE TABLET BY MOUTH EVERY 7 DAYS WITH WATER ON EMPTY STOMACH, NOTHING BY MOUTH OR LIE DOWN FOR NEXT 30 MINUTES 02/13/18  Yes Elayne Snare, MD    telmisartan (MICARDIS) 40 MG tablet Take 1 tablet (40 mg total) by mouth daily. 10/22/17  Yes Tereasa Coop, PA-C  glucose blood (ONETOUCH VERIO) test strip Use as instructed to check blood sugar once a day at various times-Dx code E11.9 06/05/17   Elayne Snare, MD    Allergies  Allergen Reactions  . Nitrofurantoin Monohyd Macro Anaphylaxis  . Penicillins Anaphylaxis  . Shellfish Allergy Anaphylaxis  . Tuna [Fish Allergy] Anaphylaxis  . Clarithromycin Other (See Comments)    Face turns red.  . Crestor [Rosuvastatin]   . Food     tuna  . Iodinated Diagnostic Agents     Prev CT reports state anaphylaxis with IV contrast, pt also states severe reaction with contrast  . Levofloxacin   . Levofloxacin Other (See Comments)    Face turned red.  . Lipitor [Atorvastatin Calcium]   . Lipitor [Atorvastatin Calcium] Other (See Comments)    Muscle pain  . Macrolides And Ketolides   . Nitrofurantoin Monohyd Macro   . Penicillins   . Percocet [Oxycodone-Acetaminophen]   . Percocet [Oxycodone-Acetaminophen] Swelling  . Sulfa Antibiotics   . Sulfa Antibiotics Swelling  . Geralyn Flash [Fish Allergy] Hives    Patient Active Problem List   Diagnosis Date Noted  . Hypertension 09/26/2016  . Class 1 obesity due to excess calories with serious comorbidity and body mass index (BMI) of  32.0 to 32.9 in adult 09/26/2016  . 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 Generalized anxiety disorder 05/29/2016  . Osteoporosis 10/14/2015  . Coronary artery calcification 07/01/2015  . Smoker 07/01/2015  . COPD, moderate ([place]) 07/01/2015  . Enlargement of lymph nodes 01/01/2014  . Cervical disc disease 05/21/2012  . Recurrent sinusitis 05/21/2012  . Diabetes mellitus ([place]) 10/10/2011  . Allergic rhinitis 10/10/2011  . RAD (reactive airway disease) 10/10/2011  . Diabetes mellitus 08/22/2011  . Asthma 08/22/2011  . Anxiety   . Hyperlipidemia   . Hypertension     Past Medical History:  Diagnosis Date   . Abnormal CT scan, chest    stable  . Allergy   . Anxiety   . Asthma   . Asthma   . Cervical disc disease   . Diabetes mellitus   . Diabetes mellitus without complication ([place])   . Discoid lupus   . Gallbladder polyp   . Hyperlipidemia   . Hypertension   . Lymphadenopathy   . Postmenopausal HRT (hormone replacement therapy)   . Pulmonary embolism ([place]) At age 35    With associated DVT  . Renal cyst   . Shingles 12/2010    Past Surgical History:  Procedure Laterality Date  . APPENDECTOMY     child  . APPENDECTOMY    . BREAST SURGERY    . LAPAROSCOPIC HELLER MYOTOMY    . NECK SURGERY     Disk and rod  . SPINE SURGERY  2004   c spine  . TONSILLECTOMY AND ADENOIDECTOMY    . TUBAL LIGATION      Social History   Socioeconomic History  . Marital status: Married    Spouse name: Not on file  . Number of children: 2  . Years of education: 18  . Highest education level: Not on file  Occupational History  . Occupation: caretaker  . Occupation: Retired  Scientific laboratory technician  . Financial resource strain: Not on file  . Food insecurity:    Worry: Not on file    Inability: Not on file  . Transportation needs:    Medical: Not on file    Non-medical: Not on file  Tobacco Use  . Smoking status: Current Every Day Smoker    Packs/day: 0.25    Years: 35.00    Pack years: 8.75    Types: Cigarettes    Last attempt to quit: 01/24/2015    Years since quitting: 3.2  . Smokeless tobacco: Former Systems developer  . Tobacco comment: Encouraged to remain smoke free.  Substance and Sexual Activity  . Alcohol use: No    Alcohol/week: 0.0 standard drinks    Comment: 2 glasses weekly  . Drug use: No  . Sexual activity: Yes    Birth control/protection: None  Lifestyle  . Physical activity:    Days per week: Not on file    Minutes per session: Not on file  . Stress: Not on file  Relationships  . Social connections:    Talks on phone: Not on file    Gets together: Not on file    Attends  religious service: Not on file    Active member of club or organization: Not on file    Attends meetings of clubs or organizations: Not on file    Relationship status: Not on file  . Intimate partner violence:    Fear of current or ex partner: Not on file    Emotionally abused: Not on file  Physically abused: Not on file    Forced sexual activity: Not on file  Other Topics Concern  . 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  Denies abuse and feels safe at home.        ** Data from: 08/22/11 Enc Dept: [facility]   Exercise--walking daily 20-30 min.    Family History  Problem Relation Age of Onset  . Osteoporosis Mother   . Osteoporosis Sister   . Breast cancer Daughter 71  . Dementia Maternal Grandmother   . Stroke Maternal Grandfather      Review of Systems  Constitutional: Negative for chills and fever.  HENT: Negative.  Negative for sore throat.   Eyes: Negative.  Negative for blurred vision and double vision.  Respiratory: Negative.  Negative for cough and shortness of breath.   Cardiovascular: Negative.  Negative for chest pain and palpitations.  Gastrointestinal: Negative.  Negative for abdominal pain, nausea and vomiting.  Genitourinary: Negative.  Negative for dysuria.  Skin: Negative for itching.  Neurological: Negative.  Negative for dizziness and headaches.  Endo/Heme/Allergies: Negative.   All other systems reviewed and are negative.   Vitals:   04/23/18 1619  BP: (!) 154/86  Pulse: 68  Resp: 16  Temp: 98.7 F (37.1 C)  SpO2: 97%    Physical Exam  Constitutional: She is oriented to person, place, and time. She appears well-developed and well-nourished.  HENT:  Head: Normocephalic and atraumatic.  Nose: Nose normal.  Mouth/Throat: Oropharynx is clear and moist.  Eyes: Pupils are equal, round, and reactive to light.  Conjunctivae and EOM are normal.  Neck: Normal range of motion. Neck supple.  Cardiovascular: Normal rate and regular rhythm.  Pulmonary/Chest: Effort normal and breath sounds normal.  Abdominal: Soft. Bowel sounds are normal. She exhibits no distension. There is no tenderness.  Musculoskeletal: Normal range of motion. She exhibits no edema.  Neurological: She is alert and oriented to person, place, and time. No sensory deficit. She exhibits normal muscle tone.  Skin: Skin is warm and dry. Capillary refill takes less than 2 seconds.  Psychiatric: She has a normal mood and affect. Her behavior is normal.  Vitals reviewed.    ASSESSMENT & PLAN: Malashia was seen today for establish care and urinary frequency.  Diagnoses and all orders for this visit:  Urinary frequency -     POCT urinalysis dipstick -     Urine Culture -     cephALEXin (KEFLEX) 500 MG capsule; Take 1 capsule (500 mg total) by mouth 3 (three) times daily for 7 days.  Type 2 diabetes mellitus without complication, without long-term current use of insulin (HCC) -     telmisartan (MICARDIS) 40 MG tablet; Take 1 tablet (40 mg total) by mouth daily.  Seasonal allergic rhinitis due to pollen -     fluticasone (FLONASE) 50 MCG/ACT nasal spray; USE TWO SPRAY(S) IN EACH NOSTRIL ONCE DAILY    Patient Instructions       If you have lab work done today you will be contacted with your lab results within the next 2 weeks.  If you have not heard from [place] then please contact us. The fastest way to get your results is to register for My Chart.   IF you received an x-ray today, you will receive an invoice from [facility]  Radiology. Please contact [facility] Radiology at [phone] with questions or concerns regarding your invoice.   IF you received labwork today, you will receive an invoice from [facility]. Please contact [facility] at [phone] with questions or concerns regarding your invoice.   Our billing staff will not be  able to assist you with questions regarding bills from these companies.  You will be contacted with the lab results as soon as they are available. The fastest way to get your results is to activate your My Chart account. Instructions are located on the last page of this paperwork. If you have not heard from [place] regarding the results in 2 weeks, please contact this office.     Urinary Tract Infection, Adult A urinary tract infection (UTI) is an infection of any part of the urinary tract. The urinary tract includes the:  Kidneys.  Ureters.  Bladder.  Urethra.  These organs make, store, and get rid of pee (urine) in the body. Follow these instructions at home:  Take over-the-counter and prescription medicines only as told by your doctor.  If you were prescribed an antibiotic medicine, take it as told by your doctor. Do not stop taking the antibiotic even if you start to feel better.  Avoid the following drinks: ? Alcohol. ? Caffeine. ? Tea. ? Carbonated drinks.  Drink enough fluid to keep your pee clear or pale yellow.  Keep all follow-up visits as told by your doctor. This is important.  Make sure to: ? Empty your bladder often and completely. Do not to hold pee for long periods of time. ? Empty your bladder before and after sex. ? Wipe from front to back after a bowel movement if you are female. Use each tissue one time when you wipe. Contact a doctor if:  You have back pain.  You have a fever.  You feel sick to your stomach (nauseous).  You throw up (vomit).  Your symptoms do not get better after 3 days.  Your symptoms go away and then come back. Get help right away if:  You have very bad back pain.  You have very bad lower belly (abdominal) pain.  You are throwing up and cannot keep down any medicines or water. This information is not intended to replace advice given to you by your health care provider. Make sure you discuss any questions you have with your  health care provider. Document Released: 12/20/2007 Document Revised: 12/09/2015 Document Reviewed: 05/24/2015 Elsevier Interactive Patient Education  2018 Elsevier Inc.      Agustina Caroli, MD [facility]

## 2018-04-24 LAB — URINE CULTURE

## 2018-05-27 DIAGNOSIS — K581 Irritable bowel syndrome with constipation: Secondary | ICD-10-CM | POA: Diagnosis not present

## 2018-05-27 DIAGNOSIS — K219 Gastro-esophageal reflux disease without esophagitis: Secondary | ICD-10-CM | POA: Diagnosis not present

## 2018-05-27 DIAGNOSIS — R1031 Right lower quadrant pain: Secondary | ICD-10-CM | POA: Diagnosis not present

## 2018-05-27 DIAGNOSIS — Z86010 Personal history of colonic polyps: Secondary | ICD-10-CM | POA: Diagnosis not present

## 2018-06-17 ENCOUNTER — Ambulatory Visit (INDEPENDENT_AMBULATORY_CARE_PROVIDER_SITE_OTHER): Payer: Medicare Other | Admitting: Family Medicine

## 2018-06-17 ENCOUNTER — Ambulatory Visit (INDEPENDENT_AMBULATORY_CARE_PROVIDER_SITE_OTHER)
Admission: RE | Admit: 2018-06-17 | Discharge: 2018-06-17 | Disposition: A | Discharge status: Home / Self Care | Payer: Medicare Other | Source: Ambulatory Visit | Attending: Acute Care | Admitting: Acute Care

## 2018-06-17 ENCOUNTER — Encounter: Payer: Self-pay | Admitting: Family Medicine

## 2018-06-17 ENCOUNTER — Other Ambulatory Visit: Payer: Self-pay

## 2018-06-17 VITALS — BP 122/75 | HR 73 | Temp 98.0°F | Ht 62.0 in | Wt 180.0 lb

## 2018-06-17 DIAGNOSIS — F17210 Nicotine dependence, cigarettes, uncomplicated: Secondary | ICD-10-CM

## 2018-06-17 DIAGNOSIS — Z122 Encounter for screening for malignant neoplasm of respiratory organs: Secondary | ICD-10-CM

## 2018-06-17 DIAGNOSIS — R300 Dysuria: Secondary | ICD-10-CM

## 2018-06-17 DIAGNOSIS — R102 Pelvic and perineal pain: Secondary | ICD-10-CM

## 2018-06-17 DIAGNOSIS — Z87891 Personal history of nicotine dependence: Secondary | ICD-10-CM | POA: Diagnosis not present

## 2018-06-17 DIAGNOSIS — N952 Postmenopausal atrophic vaginitis: Secondary | ICD-10-CM

## 2018-06-17 LAB — POCT URINALYSIS DIP (MANUAL ENTRY)
Bilirubin, UA: NEGATIVE
Blood, UA: NEGATIVE
Glucose, UA: NEGATIVE mg/dL
Ketones, POC UA: NEGATIVE mg/dL
Nitrite, UA: NEGATIVE
Protein Ur, POC: NEGATIVE mg/dL
Spec Grav, UA: 1.025 (ref 1.010–1.025)
Urobilinogen, UA: 0.2 E.U./dL
pH, UA: 5.5 (ref 5.0–8.0)

## 2018-06-17 LAB — POC MICROSCOPIC URINALYSIS (UMFC): Mucus: ABSENT

## 2018-06-17 MED ORDER — ESTROGENS, CONJUGATED 0.625 MG/GM VA CREA: 0.5000 | TOPICAL_CREAM | VAGINAL | 1 refills | Status: DC

## 2018-06-17 NOTE — Progress Notes (Signed)
122 75 l   s   n   1

## 2018-06-17 NOTE — Progress Notes (Signed)
12/2/20193:31 PM  Alison Friedman Nov 17, 1945, 72 y.o. female [id number]  Chief Complaint  Patient presents with  . Polyuria    pain while urination, pain in the stomach since Tuesday. Took left over medication of doxy which did make her feel better. Took 5 tabs, has niot taken anything since    HPI:   Patient is a 72 y.o. female with past medical history significant for DM2, HTN who presents today for dysuria  On thanksgiving night she dysuria and frequency, no blood No fever or chills Took doxy for 5 doses, last 3 days ago Was getting better and now back to feeling pressure, uncomfortable Normal BM  Has done well with fosfomycin in the past + ur cx in June but neg in oct    Fall Risk  06/17/2018 06/17/2018 01/21/2018 01/10/2018 05/03/2017  Falls in the past year? 0 0 No No No  Number falls in past yr: - - - - -  Injury with Fall? - - - - -     Depression screen [facility] 2/9 06/17/2018 01/21/2018 01/10/2018  Decreased Interest 0 0 0  Down, Depressed, Hopeless 0 0 0  PHQ - 2 Score 0 0 0  Altered sleeping - - -  Tired, decreased energy - - -  Change in appetite - - -  Feeling bad or failure about yourself  - - -  Trouble concentrating - - -  Moving slowly or fidgety/restless - - -  Suicidal thoughts - - -  PHQ-9 Score - - -  Difficult doing work/chores - - -    Allergies  Allergen Reactions  . Nitrofurantoin Monohyd Macro Anaphylaxis  . Penicillins Anaphylaxis  . Shellfish Allergy Anaphylaxis  . Tuna [Fish Allergy] Anaphylaxis  . Clarithromycin Other (See Comments)    Face turns red.  . Crestor [Rosuvastatin]   . Food     tuna  . Iodinated Diagnostic Agents     Prev CT reports state anaphylaxis with IV contrast, pt also states severe reaction with contrast  . Levofloxacin   . Levofloxacin Other (See Comments)    Face turned red.  . Lipitor [Atorvastatin Calcium]   . Lipitor [Atorvastatin Calcium] Other (See Comments)    Muscle pain  . Macrolides And Ketolides   .  Nitrofurantoin Monohyd Macro   . Penicillins   . Percocet [Oxycodone-Acetaminophen]   . Percocet [Oxycodone-Acetaminophen] Swelling  . Sulfa Antibiotics   . Sulfa Antibiotics Swelling  . Tuna [Fish Allergy] Hives    Prior to Admission medications   Medication Sig Start Date End Date Taking? Authorizing Provider  albuterol (VENTOLIN HFA) 108 (90 Base) MCG/ACT inhaler INHALE TWO PUFFS BY MOUTH EVERY 4 HOURS AS NEEDED FOR WHEEZING OR SHORTNESS OF BREATH 05/03/17  Yes Tereasa Coop, PA-C  ALPRAZolam Duanne Moron) 0.25 MG tablet Take 0.5-1 tablets (0.125-0.25 mg total) by mouth daily as needed for anxiety. 10/22/17  Yes Tereasa Coop, PA-C  aspirin EC 81 MG tablet Take 81 mg by mouth daily.   Yes [provider]  cholecalciferol (VITAMIN D) 1000 units tablet Take 2,000 Units by mouth 2 (two) times daily.   Yes [provider]  dicyclomine (BENTYL) 20 MG tablet TAKE ONE TABLET BY MOUTH EVERY 6 HOURS 04/08/15  Yes Daub, Loura Back, MD  Evolocumab (REPATHA Antoine) Inject into the skin.   Yes [provider]  famotidine (PEPCID) 20 MG tablet Take 20 mg by mouth 2 (two) times daily.   Yes [provider]  fluticasone Asencion Islam)  50 MCG/ACT nasal spray USE TWO SPRAY(S) IN EACH NOSTRIL ONCE DAILY 04/23/18  Yes Sagardia, [place], MD  glucose blood [facility] VERIO) test strip Use as instructed to check blood sugar once a day at various times-Dx code E11.9 06/05/17  Yes Elayne Snare, MD  metFORMIN (GLUCOPHAGE-XR) 500 MG 24 hr tablet Take 2 tablets (1,000 mg total) by mouth 2 (two) times daily. 07/18/17  Yes Tereasa Coop, PA-C  risedronate (ACTONEL) 35 MG tablet TAKE ONE TABLET BY MOUTH EVERY 7 DAYS WITH WATER ON EMPTY STOMACH, NOTHING BY MOUTH OR LIE DOWN FOR NEXT 30 MINUTES 02/13/18  Yes Elayne Snare, MD  telmisartan (MICARDIS) 40 MG tablet Take 1 tablet (40 mg total) by mouth daily. 04/23/18 07/22/18 Yes SagardiaInes Bloomer, MD    Past Medical History:  Diagnosis Date  .  Abnormal CT scan, chest    stable  . Allergy   . Anxiety   . Asthma   . Asthma   . Cervical disc disease   . Diabetes mellitus   . Diabetes mellitus without complication ([place])   . Discoid lupus   . Gallbladder polyp   . Hyperlipidemia   . Hypertension   . Lymphadenopathy   . Postmenopausal HRT (hormone replacement therapy)   . Pulmonary embolism ([place]) At age 76    With associated DVT  . Renal cyst   . Shingles 12/2010    Past Surgical History:  Procedure Laterality Date  . APPENDECTOMY     child  . APPENDECTOMY    . BREAST SURGERY    . LAPAROSCOPIC HELLER MYOTOMY    . NECK SURGERY     Disk and rod  . SPINE SURGERY  2004   c spine  . TONSILLECTOMY AND ADENOIDECTOMY    . TUBAL LIGATION      Social History   Tobacco Use  . Smoking status: Current Every Day Smoker    Packs/day: 0.25    Years: 35.00    Pack years: 8.75    Types: Cigarettes    Last attempt to quit: 01/24/2015    Years since quitting: 3.3  . Smokeless tobacco: Former Systems developer  . Tobacco comment: Encouraged to remain smoke free.  Substance Use Topics  . Alcohol use: No    Alcohol/week: 0.0 standard drinks    Comment: 2 glasses weekly    Family History  Problem Relation Age of Onset  . Osteoporosis Mother   . Osteoporosis Sister   . Breast cancer Daughter 2  . Dementia Maternal Grandmother   . Stroke Maternal Grandfather     ROS Per hpi  OBJECTIVE:  Blood pressure 122/75, pulse 73, temperature 98 F (36.7 C), temperature source Oral, height 5\' 2"  (1.575 m), weight 180 lb (81.6 kg), SpO2 95 %. Body mass index is 32.92 kg/m.   Physical Exam  Constitutional: She is oriented to person, place, and time. She appears well-developed and well-nourished.  HENT:  Head: Normocephalic and atraumatic.  Mouth/Throat: Oropharynx is clear and moist. No oropharyngeal exudate.  Eyes: Pupils are equal, round, and reactive to light. Conjunctivae and EOM are normal. No scleral icterus.  Neck: Neck supple.    Cardiovascular: Normal rate, regular rhythm and normal heart sounds. Exam reveals no gallop and no friction rub.  No murmur heard. Pulmonary/Chest: Effort normal and breath sounds normal. She has no wheezes. She has no rales.  Abdominal: Soft. Bowel sounds are normal. She exhibits no distension. There is tenderness (suprapubic, LLQ). There is no rebound, no guarding and  no CVA tenderness.  Genitourinary: There is no rash or lesion on the right labia. There is no rash or lesion on the left labia. Uterus is not enlarged, not fixed and not tender. Cervix exhibits no motion tenderness and no discharge. Right adnexum displays no tenderness and no fullness. Left adnexum displays tenderness. Left adnexum displays no fullness. No erythema in the vagina. No vaginal discharge found.  Genitourinary Comments: Atrophic vaginal tissue No prolapse   Musculoskeletal: She exhibits no edema.  Neurological: She is alert and oriented to person, place, and time.  Skin: Skin is warm and dry.  Psychiatric: She has a normal mood and affect.  Nursing note and vitals reviewed.     Results for orders placed or performed in visit on 06/17/18 (from the past 24 hour(s))  POCT urinalysis dipstick     Status: Abnormal   Collection Time: 06/17/18  3:19 PM  Result Value Ref Range   Color, UA yellow yellow   Clarity, UA clear clear   Glucose, UA negative negative mg/dL   Bilirubin, UA negative negative   Ketones, POC UA negative negative mg/dL   Spec Grav, UA 1.025 1.010 - 1.025   Blood, UA negative negative   pH, UA 5.5 5.0 - 8.0   Protein Ur, POC negative negative mg/dL   Urobilinogen, UA 0.2 0.2 or 1.0 E.U./dL   Nitrite, UA Negative Negative   Leukocytes, UA Trace (A) Negative  POCT Microscopic Urinalysis ([facility])     Status: Abnormal   Collection Time: 06/17/18  3:34 PM  Result Value Ref Range   WBC,UR,HPF,POC Moderate (A) None WBC/hpf   RBC,UR,HPF,POC None None RBC/hpf   Bacteria Few (A) None, Too numerous to  count   Mucus Absent Absent   Epithelial Cells, UR Per Microscopy Few (A) None, Too numerous to count cells/hpf       ASSESSMENT and PLAN  1. Dysuria Pending Urcx results, push fluids - POCT urinalysis dipstick - POCT Microscopic Urinalysis ([facility]) - Urine Culture  2. Vaginal atrophy Starting top low dose vag estrogen, new med r/se/b reviewed  3. Pelvic pain in female - US Pelvic Complete With Transvaginal; Future  Other orders - conjugated estrogens (PREMARIN) vaginal cream; Place 0.5 Applicatorfuls vaginally 2 (two) times a week.  Return in about 6 weeks (around 07/29/2018).    Rutherford Guys, MD [facility] at [facility], [place] [address] Ph.  [phone] Fax [phone]

## 2018-06-17 NOTE — Patient Instructions (Addendum)
     If you have lab work done today you will be contacted with your lab results within the next 2 weeks.  If you have not heard from [place] then please contact us. The fastest way to get your results is to register for My Chart.   IF you received an x-ray today, you will receive an invoice from [facility] Radiology. Please contact [facility] Radiology at [phone] with questions or concerns regarding your invoice.   IF you received labwork today, you will receive an invoice from [place]. Please contact [facility] at [phone] with questions or concerns regarding your invoice.   Our billing staff will not be able to assist you with questions regarding bills from these companies.  You will be contacted with the lab results as soon as they are available. The fastest way to get your results is to activate your My Chart account. Instructions are located on the last page of this paperwork. If you have not heard from [place] regarding the results in 2 weeks, please contact this office.     Atrophic Vaginitis Atrophic vaginitis is when the tissues that line the vagina become dry and thin. This is caused by a drop in estrogen. Estrogen helps:  To keep the vagina moist.  To make a clear fluid that helps: ? To lubricate the vagina for sex. ? To protect the vagina from infection.  If the lining of the vagina is dry and thin, it may:  Make sex painful. It may also cause bleeding.  Cause a feeling of: ? Burning. ? Irritation. ? Itchiness.  Make an exam of your vagina painful. It may also cause bleeding.  Make you lose interest in sex.  Cause a burning feeling when you pee.  Make your vaginal fluid (discharge) brown or yellow.  For some women, there are no symptoms. This condition is most common in women who do not get their regular menstrual periods anymore (menopause). This often starts when a woman is 20-41 years old. Follow these instructions at home:  Take medicines only as told by  your doctor. Do not use any herbal or alternative medicines unless your doctor says it is okay.  Use over-the-counter products for dryness only as told by your doctor. These include: ? Creams. ? Lubricants. ? Moisturizers.  Do not douche.  Do not use products that can make your vagina dry. These include: ? Scented feminine sprays. ? Scented tampons. ? Scented soaps.  If it hurts to have sex, tell your sexual partner. Contact a doctor if:  Your discharge looks different than normal.  Your vagina has an unusual smell.  You have new symptoms.  Your symptoms do not get better with treatment.  Your symptoms get worse. This information is not intended to replace advice given to you by your health care provider. Make sure you discuss any questions you have with your health care provider. Document Released: 12/20/2007 Document Revised: 12/09/2015 Document Reviewed: 06/24/2014 Elsevier Interactive Patient Education  [employer].

## 2018-06-20 ENCOUNTER — Other Ambulatory Visit: Payer: Self-pay | Admitting: Acute Care

## 2018-06-20 DIAGNOSIS — Z87891 Personal history of nicotine dependence: Secondary | ICD-10-CM

## 2018-06-20 DIAGNOSIS — F17210 Nicotine dependence, cigarettes, uncomplicated: Secondary | ICD-10-CM

## 2018-06-20 DIAGNOSIS — Z122 Encounter for screening for malignant neoplasm of respiratory organs: Secondary | ICD-10-CM

## 2018-06-20 LAB — URINE CULTURE

## 2018-06-25 ENCOUNTER — Ambulatory Visit (HOSPITAL_COMMUNITY)
Admission: RE | Admit: 2018-06-25 | Discharge: 2018-06-25 | Disposition: A | Discharge status: Home / Self Care | Payer: Medicare Other | Source: Ambulatory Visit | Attending: Family Medicine | Admitting: Family Medicine

## 2018-06-25 DIAGNOSIS — R102 Pelvic and perineal pain: Secondary | ICD-10-CM | POA: Insufficient documentation

## 2018-06-25 DIAGNOSIS — R1032 Left lower quadrant pain: Secondary | ICD-10-CM | POA: Diagnosis not present

## 2018-06-26 ENCOUNTER — Encounter: Payer: Self-pay | Admitting: Family Medicine

## 2018-06-27 MED ORDER — FOSFOMYCIN TROMETHAMINE 3 G PO PACK: 3.0000 g | PACK | Freq: Once | ORAL | 0 refills | Status: AC

## 2018-06-27 NOTE — Addendum Note (Signed)
Addended by: Rutherford Guys on: 06/27/2018 10:22 PM   Modules accepted: Orders

## 2018-06-28 NOTE — Telephone Encounter (Signed)
rx for fosfomycin 3mg  x 1 sent od 06/27/18. Does not show up on active meds as one time dose Can be found on list of completed meds

## 2018-07-30 ENCOUNTER — Other Ambulatory Visit: Payer: Self-pay

## 2018-07-30 ENCOUNTER — Ambulatory Visit (INDEPENDENT_AMBULATORY_CARE_PROVIDER_SITE_OTHER): Payer: Medicare Other | Admitting: Family Medicine

## 2018-07-30 ENCOUNTER — Encounter: Payer: Self-pay | Admitting: Family Medicine

## 2018-07-30 VITALS — BP 118/74 | HR 72 | Temp 98.2°F | Ht 62.0 in | Wt 185.2 lb

## 2018-07-30 DIAGNOSIS — Z87891 Personal history of nicotine dependence: Secondary | ICD-10-CM

## 2018-07-30 DIAGNOSIS — E119 Type 2 diabetes mellitus without complications: Secondary | ICD-10-CM

## 2018-07-30 DIAGNOSIS — R300 Dysuria: Secondary | ICD-10-CM

## 2018-07-30 DIAGNOSIS — I10 Essential (primary) hypertension: Secondary | ICD-10-CM | POA: Diagnosis not present

## 2018-07-30 DIAGNOSIS — N952 Postmenopausal atrophic vaginitis: Secondary | ICD-10-CM | POA: Diagnosis not present

## 2018-07-30 DIAGNOSIS — Z789 Other specified health status: Secondary | ICD-10-CM

## 2018-07-30 LAB — POCT URINALYSIS DIP (MANUAL ENTRY)
Bilirubin, UA: NEGATIVE
Blood, UA: NEGATIVE
Glucose, UA: NEGATIVE mg/dL
Ketones, POC UA: NEGATIVE mg/dL
Leukocytes, UA: NEGATIVE
Nitrite, UA: NEGATIVE
Protein Ur, POC: NEGATIVE mg/dL
Spec Grav, UA: 1.015 (ref 1.010–1.025)
Urobilinogen, UA: 0.2 E.U./dL
pH, UA: 5.5 (ref 5.0–8.0)

## 2018-07-30 MED ORDER — BUPROPION HCL ER (SR) 150 MG PO TB12: 150.0000 mg | ORAL_TABLET | Freq: Two times a day (BID) | ORAL | 0 refills | Status: DC

## 2018-07-30 NOTE — Progress Notes (Signed)
1/14/20203:20 PM  Alison Friedman Nov 29, 1945, 73 y.o. female [id number]  Chief Complaint  Patient presents with  . Follow-up    only here to chk up on uti, Urine culture done along with imaging. Nothing found. Was given a cream for the vaginal issue she was having    HPI:   Patient is a 73 y.o. female with past medical history significant for DM2, HTN who presents today for followup  Last OV started on vaginal estrogen Doing better, dysuria much better Using twice a week  Has severe muscle pain and joint pain with crestor and atorvstatin Currently on repatha, by cards, last LDL 112, was told that she also had elevated TG per patient in July 2019  Constipation is improved  She is concerned about weight gain She has had issues in the past with weight gain when she stopped smoking She is currently not smoking at all  Checking cbgs mostly in morning, highest 130s Feels like goes low in the afternoon, feels it as confusion Sees Dr Dwyane Dee  Lab Results  Component Value Date   HGBA1C 7.0 (A) 01/21/2018   HGBA1C 7.1 (H) 11/16/2017   HGBA1C 7.1 10/22/2017   Lab Results  Component Value Date   MICROALBUR <0.7 11/16/2017   [place] 93 07/01/2015   CREATININE 1.05 11/16/2017    Fall Risk  07/30/2018 06/17/2018 06/17/2018 01/21/2018 01/10/2018  Falls in the past year? 0 0 0 No No  Number falls in past yr: - - - - -  Injury with Fall? - - - - -     Depression screen [facility] 2/9 07/30/2018 06/17/2018 01/21/2018  Decreased Interest 0 0 0  Down, Depressed, Hopeless 0 0 0  PHQ - 2 Score 0 0 0  Altered sleeping - - -  Tired, decreased energy - - -  Change in appetite - - -  Feeling bad or failure about yourself  - - -  Trouble concentrating - - -  Moving slowly or fidgety/restless - - -  Suicidal thoughts - - -  PHQ-9 Score - - -  Difficult doing work/chores - - -    Allergies  Allergen Reactions  . Nitrofurantoin Monohyd Macro Anaphylaxis  . Penicillins Anaphylaxis  . Shellfish  Allergy Anaphylaxis  . Tuna [Fish Allergy] Anaphylaxis  . Clarithromycin Other (See Comments)    Face turns red.  . Crestor [Rosuvastatin]   . Food     tuna  . Iodinated Diagnostic Agents     Prev CT reports state anaphylaxis with IV contrast, pt also states severe reaction with contrast  . Levofloxacin   . Levofloxacin Other (See Comments)    Face turned red.  . Lipitor [Atorvastatin Calcium]   . Lipitor [Atorvastatin Calcium] Other (See Comments)    Muscle pain  . Macrolides And Ketolides   . Nitrofurantoin Monohyd Macro   . Penicillins   . Percocet [Oxycodone-Acetaminophen]   . Percocet [Oxycodone-Acetaminophen] Swelling  . Sulfa Antibiotics   . Sulfa Antibiotics Swelling  . Tuna [Fish Allergy] Hives    Prior to Admission medications   Medication Sig Start Date End Date Taking? Authorizing Provider  albuterol (VENTOLIN HFA) 108 (90 Base) MCG/ACT inhaler INHALE TWO PUFFS BY MOUTH EVERY 4 HOURS AS NEEDED FOR WHEEZING OR SHORTNESS OF BREATH 05/03/17  Yes Tereasa Coop, PA-C  ALPRAZolam Duanne Moron) 0.25 MG tablet Take 0.5-1 tablets (0.125-0.25 mg total) by mouth daily as needed for anxiety. 10/22/17  Yes Tereasa Coop, PA-C  aspirin EC 81 MG  tablet Take 81 mg by mouth daily.   Yes [provider]  cholecalciferol (VITAMIN D) 1000 units tablet Take 2,000 Units by mouth 2 (two) times daily.   Yes [provider]  conjugated estrogens (PREMARIN) vaginal cream Place 0.5 Applicatorfuls vaginally 2 (two) times a week. 06/17/18  Yes Rutherford Guys, MD  dicyclomine (BENTYL) 20 MG tablet TAKE ONE TABLET BY MOUTH EVERY 6 HOURS 04/08/15  Yes Daub, Loura Back, MD  Evolocumab (REPATHA [place]) Inject into the skin.   Yes [provider]  famotidine (PEPCID) 20 MG tablet Take 20 mg by mouth 2 (two) times daily.   Yes [provider]  fluticasone (FLONASE) 50 MCG/ACT nasal spray USE TWO SPRAY(S) IN EACH NOSTRIL ONCE DAILY 04/23/18  Yes Sagardia, Sinclair, MD    glucose blood (ONETOUCH VERIO) test strip Use as instructed to check blood sugar once a day at various times-Dx code E11.9 06/05/17  Yes Elayne Snare, MD  metFORMIN (GLUCOPHAGE-XR) 500 MG 24 hr tablet Take 2 tablets (1,000 mg total) by mouth 2 (two) times daily. 07/18/17  Yes Tereasa Coop, PA-C  risedronate (ACTONEL) 35 MG tablet TAKE ONE TABLET BY MOUTH EVERY 7 DAYS WITH WATER ON EMPTY STOMACH, NOTHING BY MOUTH OR LIE DOWN FOR NEXT 30 MINUTES 02/13/18  Yes Elayne Snare, MD  telmisartan (MICARDIS) 40 MG tablet Take 1 tablet (40 mg total) by mouth daily. 04/23/18 07/22/18  Horald Pollen, MD    Past Medical History:  Diagnosis Date  . Abnormal CT scan, chest    stable  . Allergy   . Anxiety   . Asthma   . Asthma   . Cervical disc disease   . Diabetes mellitus   . Diabetes mellitus without complication ([place])   . Discoid lupus   . Gallbladder polyp   . Hyperlipidemia   . Hypertension   . Lymphadenopathy   . Postmenopausal HRT (hormone replacement therapy)   . Pulmonary embolism ([place]) At age 57    With associated DVT  . Renal cyst   . Shingles 12/2010    Past Surgical History:  Procedure Laterality Date  . APPENDECTOMY     child  . APPENDECTOMY    . BREAST SURGERY    . LAPAROSCOPIC HELLER MYOTOMY    . NECK SURGERY     Disk and rod  . SPINE SURGERY  2004   c spine  . TONSILLECTOMY AND ADENOIDECTOMY    . TUBAL LIGATION      Social History   Tobacco Use  . Smoking status: Current Every Day Smoker    Packs/day: 0.25    Years: 35.00    Pack years: 8.75    Types: Cigarettes    Last attempt to quit: 01/24/2015    Years since quitting: 3.5  . Smokeless tobacco: Former Systems developer  . Tobacco comment: Encouraged to remain smoke free.  Substance Use Topics  . Alcohol use: No    Alcohol/week: 0.0 standard drinks    Comment: 2 glasses weekly    Family History  Problem Relation Age of Onset  . Osteoporosis Mother   . Osteoporosis Sister   . Breast cancer Daughter 28  .  Dementia Maternal Grandmother   . Stroke Maternal Grandfather     ROS Per hpi  OBJECTIVE:  Blood pressure 118/74, pulse 72, temperature 98.2 F (36.8 C), temperature source Oral, height 5\' 2"  (1.575 m), weight 185 lb 3.2 oz (84 kg), SpO2 98 %. Body mass index is 33.87 kg/m.  Wt Readings from Last 3 Encounters:  07/30/18 185 lb 3.2 oz (84 kg)  06/17/18 180 lb (81.6 kg)  04/23/18 178 lb 6.4 oz (80.9 kg)    Physical Exam Vitals signs and nursing note reviewed.  Constitutional:      Appearance: She is well-developed.  HENT:     Head: Normocephalic and atraumatic.  Eyes:     General: No scleral icterus.    Conjunctiva/sclera: Conjunctivae normal.     Pupils: Pupils are equal, round, and reactive to light.  Neck:     Musculoskeletal: Neck supple.  Pulmonary:     Effort: Pulmonary effort is normal.  Skin:    General: Skin is warm and dry.  Neurological:     Mental Status: She is alert and oriented to person, place, and time.     Results for orders placed or performed in visit on 07/30/18 (from the past 24 hour(s))  POCT urinalysis dipstick     Status: Abnormal   Collection Time: 07/30/18  2:59 PM  Result Value Ref Range   Color, UA yellow yellow   Clarity, UA cloudy (A) clear   Glucose, UA negative negative mg/dL   Bilirubin, UA negative negative   Ketones, POC UA negative negative mg/dL   Spec Grav, UA 1.015 1.010 - 1.025   Blood, UA negative negative   pH, UA 5.5 5.0 - 8.0   Protein Ur, POC negative negative mg/dL   Urobilinogen, UA 0.2 0.2 or 1.0 E.U./dL   Nitrite, UA Negative Negative   Leukocytes, UA Negative Negative    ASSESSMENT and PLAN  1. Vaginal atrophy Controlled. Continue current regime.   2. Dysuria Much improved with recent treatment of vaginal atrophy. UA neg today - POCT urinalysis dipstick  3. Type 2 diabetes mellitus without complication, without long-term current use of insulin ([place]) Checking labs today, medications will be adjusted  as needed. Consider GLP1 given weight gain concerns - Hemoglobin A1c - Comprehensive metabolic panel - Lipid panel  4. Essential hypertension Controlled. Continue current regime.  - Lipid panel  5. Statin intolerance On rapath thru cards, checking lipids today  6. Discontinued smoking Has been able to quit but struggling with increased appetite and weight gain. Starting wellbutrin, reviewed r/se/b  Other orders - buPROPion (WELLBUTRIN SR) 150 MG 12 hr tablet; Take 1 tablet (150 mg total) by mouth 2 (two) times daily.  Return in about 3 months (around 10/29/2018) for weight gain.    Rutherford Guys, MD [facility] [place], [place] [address] Ph.  [phone] Fax [phone]

## 2018-07-30 NOTE — Patient Instructions (Signed)
° ° ° °  If you have lab work done today you will be contacted with your lab results within the next 2 weeks.  If you have not heard from us then please contact us. The fastest way to get your results is to register for My Chart. ° ° °IF you received an x-ray today, you will receive an invoice from [facility]. Please contact [facility] at [phone] with questions or concerns regarding your invoice.  ° °IF you received labwork today, you will receive an invoice from [facility]. Please contact [facility] at [phone] with questions or concerns regarding your invoice.  ° °Our billing staff will not be able to assist you with questions regarding bills from these companies. ° °You will be contacted with the lab results as soon as they are available. The fastest way to get your results is to activate your My Chart account. Instructions are located on the last page of this paperwork. If you have not heard from us regarding the results in 2 weeks, please contact this office. °  ° ° ° °

## 2018-07-31 LAB — LIPID PANEL
Chol/HDL Ratio: 3.1 ratio (ref 0.0–4.4)
Cholesterol, Total: 131 mg/dL (ref 100–199)
HDL: 42 mg/dL
LDL Calculated: 26 mg/dL (ref 0–99)
Triglycerides: 315 mg/dL — ABNORMAL HIGH (ref 0–149)
VLDL Cholesterol Cal: 63 mg/dL — ABNORMAL HIGH (ref 5–40)

## 2018-07-31 LAB — COMPREHENSIVE METABOLIC PANEL WITH GFR
ALT: 14 IU/L (ref 0–32)
AST: 16 IU/L (ref 0–40)
Albumin/Globulin Ratio: 1.8 (ref 1.2–2.2)
Albumin: 4.3 g/dL (ref 3.5–4.8)
Alkaline Phosphatase: 96 IU/L (ref 39–117)
BUN/Creatinine Ratio: 28 (ref 12–28)
BUN: 33 mg/dL — ABNORMAL HIGH (ref 8–27)
Bilirubin Total: 0.3 mg/dL (ref 0.0–1.2)
CO2: 16 mmol/L — ABNORMAL LOW (ref 20–29)
Calcium: 9.7 mg/dL (ref 8.7–10.3)
Chloride: 100 mmol/L (ref 96–106)
Creatinine, Ser: 1.2 mg/dL — ABNORMAL HIGH (ref 0.57–1.00)
GFR calc Af Amer: 52 mL/min/1.73 — ABNORMAL LOW
GFR calc non Af Amer: 45 mL/min/1.73 — ABNORMAL LOW
Globulin, Total: 2.4 g/dL (ref 1.5–4.5)
Glucose: 227 mg/dL — ABNORMAL HIGH (ref 65–99)
Potassium: 4.8 mmol/L (ref 3.5–5.2)
Sodium: 133 mmol/L — ABNORMAL LOW (ref 134–144)
Total Protein: 6.7 g/dL (ref 6.0–8.5)

## 2018-07-31 LAB — HEMOGLOBIN A1C
Est. average glucose Bld gHb Est-mCnc: 154 mg/dL
Hgb A1c MFr Bld: 7 % — ABNORMAL HIGH (ref 4.8–5.6)

## 2018-08-14 DIAGNOSIS — N390 Urinary tract infection, site not specified: Secondary | ICD-10-CM | POA: Diagnosis not present

## 2018-08-14 DIAGNOSIS — I2583 Coronary atherosclerosis due to lipid rich plaque: Secondary | ICD-10-CM | POA: Diagnosis not present

## 2018-08-14 DIAGNOSIS — J302 Other seasonal allergic rhinitis: Secondary | ICD-10-CM | POA: Diagnosis not present

## 2018-08-14 DIAGNOSIS — N952 Postmenopausal atrophic vaginitis: Secondary | ICD-10-CM | POA: Diagnosis not present

## 2018-08-14 DIAGNOSIS — M810 Age-related osteoporosis without current pathological fracture: Secondary | ICD-10-CM | POA: Diagnosis not present

## 2018-08-14 DIAGNOSIS — E119 Type 2 diabetes mellitus without complications: Secondary | ICD-10-CM | POA: Diagnosis not present

## 2018-08-14 DIAGNOSIS — J449 Chronic obstructive pulmonary disease, unspecified: Secondary | ICD-10-CM | POA: Diagnosis not present

## 2018-08-14 DIAGNOSIS — E118 Type 2 diabetes mellitus with unspecified complications: Secondary | ICD-10-CM | POA: Diagnosis not present

## 2018-08-14 DIAGNOSIS — K219 Gastro-esophageal reflux disease without esophagitis: Secondary | ICD-10-CM | POA: Diagnosis not present

## 2018-08-14 DIAGNOSIS — I10 Essential (primary) hypertension: Secondary | ICD-10-CM | POA: Diagnosis not present

## 2018-08-14 DIAGNOSIS — F419 Anxiety disorder, unspecified: Secondary | ICD-10-CM | POA: Diagnosis not present

## 2018-08-14 DIAGNOSIS — E039 Hypothyroidism, unspecified: Secondary | ICD-10-CM | POA: Diagnosis not present

## 2018-08-14 DIAGNOSIS — I2510 Atherosclerotic heart disease of native coronary artery without angina pectoris: Secondary | ICD-10-CM | POA: Diagnosis not present

## 2018-08-15 ENCOUNTER — Telehealth: Payer: Self-pay | Admitting: Family Medicine

## 2018-08-15 NOTE — Telephone Encounter (Signed)
LVM for pt to call the office and reschedule appt that was originally on 10/28/18 with Dr. Pamella Pert. Due to Dr. Pamella Pert being out of the office, pt will need to be rescheduled. When pt calls back, please reschedule at their convenience. Thank you!

## 2018-08-16 DIAGNOSIS — E039 Hypothyroidism, unspecified: Secondary | ICD-10-CM | POA: Diagnosis not present

## 2018-08-16 DIAGNOSIS — E118 Type 2 diabetes mellitus with unspecified complications: Secondary | ICD-10-CM | POA: Diagnosis not present

## 2018-08-16 DIAGNOSIS — N390 Urinary tract infection, site not specified: Secondary | ICD-10-CM | POA: Diagnosis not present

## 2018-08-16 DIAGNOSIS — E119 Type 2 diabetes mellitus without complications: Secondary | ICD-10-CM | POA: Diagnosis not present

## 2018-08-27 DIAGNOSIS — R1031 Right lower quadrant pain: Secondary | ICD-10-CM | POA: Diagnosis not present

## 2018-08-27 DIAGNOSIS — Z86010 Personal history of colonic polyps: Secondary | ICD-10-CM | POA: Diagnosis not present

## 2018-08-27 DIAGNOSIS — K222 Esophageal obstruction: Secondary | ICD-10-CM | POA: Diagnosis not present

## 2018-08-27 DIAGNOSIS — K581 Irritable bowel syndrome with constipation: Secondary | ICD-10-CM | POA: Diagnosis not present

## 2018-08-27 DIAGNOSIS — K219 Gastro-esophageal reflux disease without esophagitis: Secondary | ICD-10-CM | POA: Diagnosis not present

## 2018-09-04 ENCOUNTER — Other Ambulatory Visit: Payer: Self-pay | Admitting: Endocrinology

## 2018-09-04 ENCOUNTER — Other Ambulatory Visit: Payer: Self-pay | Admitting: Family Medicine

## 2018-09-23 ENCOUNTER — Other Ambulatory Visit: Payer: Self-pay | Admitting: Physician Assistant

## 2018-09-23 DIAGNOSIS — E119 Type 2 diabetes mellitus without complications: Secondary | ICD-10-CM

## 2018-09-30 ENCOUNTER — Other Ambulatory Visit: Payer: Self-pay | Admitting: Physician Assistant

## 2018-09-30 DIAGNOSIS — J4520 Mild intermittent asthma, uncomplicated: Secondary | ICD-10-CM

## 2018-10-14 ENCOUNTER — Telehealth: Payer: Self-pay | Admitting: *Deleted

## 2018-10-14 NOTE — Telephone Encounter (Signed)
AWV schedule 

## 2018-10-23 ENCOUNTER — Other Ambulatory Visit: Payer: Self-pay

## 2018-10-23 DIAGNOSIS — E7800 Pure hypercholesterolemia, unspecified: Secondary | ICD-10-CM

## 2018-10-23 MED ORDER — EVOLOCUMAB 140 MG/ML ~~LOC~~ SOAJ: 140.0000 mg | SUBCUTANEOUS | 11 refills | Status: DC

## 2018-10-28 ENCOUNTER — Ambulatory Visit: Payer: Medicare Other | Admitting: Family Medicine

## 2018-11-14 ENCOUNTER — Telehealth: Payer: Self-pay

## 2018-11-14 ENCOUNTER — Other Ambulatory Visit: Payer: Self-pay

## 2018-11-14 MED ORDER — RISEDRONATE SODIUM 35 MG PO TABS: ORAL_TABLET | ORAL | 1 refills | Status: DC

## 2018-11-14 NOTE — Telephone Encounter (Signed)
Pt wants to know if you will still see her for a doxy visit if she does not have labs. Pt is not wanting to reschedule appt, she is just scared to get out of her home for the bloodwork.

## 2018-11-14 NOTE — Telephone Encounter (Signed)
Okay to do without labs

## 2018-11-15 NOTE — Telephone Encounter (Signed)
Called pt and informed her of this. Pt verbalized understanding.

## 2018-11-18 ENCOUNTER — Encounter: Payer: Self-pay | Admitting: Endocrinology

## 2018-11-18 ENCOUNTER — Other Ambulatory Visit: Payer: Medicare Other

## 2018-11-18 ENCOUNTER — Ambulatory Visit (INDEPENDENT_AMBULATORY_CARE_PROVIDER_SITE_OTHER): Payer: Medicare Other | Admitting: Endocrinology

## 2018-11-18 ENCOUNTER — Other Ambulatory Visit: Payer: Self-pay

## 2018-11-18 DIAGNOSIS — M810 Age-related osteoporosis without current pathological fracture: Secondary | ICD-10-CM | POA: Diagnosis not present

## 2018-11-18 NOTE — Progress Notes (Signed)
Patient ID: Alison Friedman, female   DOB: 11/08/1945, 73 y.o.   MRN: [id number]           Today's office visit was provided via telemedicine using video technique Explained to the patient and the the limitations of evaluation and management by telemedicine and the availability of in person appointments.  The patient understood the limitations and agreed to proceed. Patient also understood that the telehealth visit is billable. . Location of the patient: Home . Location of the provider: Office Only the patient and myself were participating in the encounter    Chief complaint: Follow-up for endocrine problems   History of Present Illness:  PROBLEM 1: She had been referred in 2017 for management of osteoporosis  Initial history as follows: She had lost about 1 inch in height over the years She has had bone density measurements done and these have showed the following T-scores on the test done in 3/17:  Femoral neck: Measurements  -2.6 and -2.5 Spine: -2.1 FRAX fracture risk at the hip is not calculated    Apparently her bone density has declined 7-10% at various sites  From prior measurements  She has no history of low trauma fracture or height loss  Age at menopause: 40-42 years.  She took hormone replacement only for a few months and has not had a hysterectomy Other risk factors: Prior history of smoking, she stopped in 2008 Positive family history of osteoporosis including her sister, mother and grandmother  Calcium supplements: None, she has intolerance to these because of constipation even if using calcium citrate or chewable calcium.  She does have some dairy products like yogurt and cheese and her diet and recently more milk. Exercise: She tries to walk as much as possible especially at her work  RECENT history: Current treatment: Actonel 35 mg weekly since 11/2016  Vitamin D supplements: D3, 5000 units daily  She was prescribed Actonel 150 mg monthly in 11/2015 but  because of an episode of flulike symptoms and diarrhea she did not continue this However she has been able to tolerate the 35 mg weekly risedronate dose since her visit in 5/18 This has not caused any GI side effects She thinks she is taking this regularly  No recent back pain Unable to check patient's height today  Since 2019 she has been told to take a higher dose of vitamin D, since previously since was low She has not had any recent labs  Bone density in 02/2018 showed the following T score at the left femur neck -2.9 compared to -2.6 T score at the right femur neck about the same T score at the spine -1.6 versus 2.1  LABS:  Vitamin D levels  Lab Results  Component Value Date   VD25OH 26.25 (L) 11/16/2017   VD25OH 36.32 11/17/2016    DIABETES: See review of systems   Past Medical History:  Diagnosis Date  . Abnormal CT scan, chest    stable  . Allergy   . Anxiety   . Asthma   . Asthma   . Cervical disc disease   . Diabetes mellitus   . Diabetes mellitus without complication ([place])   . Discoid lupus   . Gallbladder polyp   . Hyperlipidemia   . Hypertension   . Lymphadenopathy   . Postmenopausal HRT (hormone replacement therapy)   . Pulmonary embolism ([place]) At age 60    With associated DVT  . Renal cyst   . Shingles 12/2010    Past  Surgical History:  Procedure Laterality Date  . APPENDECTOMY     child  . APPENDECTOMY    . BREAST SURGERY    . LAPAROSCOPIC HELLER MYOTOMY    . NECK SURGERY     Disk and rod  . SPINE SURGERY  2004   c spine  . TONSILLECTOMY AND ADENOIDECTOMY    . TUBAL LIGATION      Family History  Problem Relation Age of Onset  . Osteoporosis Mother   . Osteoporosis Sister   . Breast cancer Daughter 48  . Dementia Maternal Grandmother   . Stroke Maternal Grandfather     Social History:  reports that she has been smoking cigarettes. She has a 8.75 pack-year smoking history. She has quit using smokeless tobacco. She reports that  she does not drink alcohol or use drugs.  Allergies:  Allergies  Allergen Reactions  . Nitrofurantoin Monohyd Macro Anaphylaxis  . Penicillins Anaphylaxis  . Shellfish Allergy Anaphylaxis  . Tuna [Fish Allergy] Anaphylaxis  . Clarithromycin Other (See Comments)    Face turns red.  . Crestor [Rosuvastatin]   . Food     tuna  . Iodinated Diagnostic Agents     Prev CT reports state anaphylaxis with IV contrast, pt also states severe reaction with contrast  . Levofloxacin   . Levofloxacin Other (See Comments)    Face turned red.  . Lipitor [Atorvastatin Calcium]   . Lipitor [Atorvastatin Calcium] Other (See Comments)    Muscle pain  . Macrolides And Ketolides   . Nitrofurantoin Monohyd Macro   . Penicillins   . Percocet [Oxycodone-Acetaminophen]   . Percocet [Oxycodone-Acetaminophen] Swelling  . Sulfa Antibiotics   . Sulfa Antibiotics Swelling  . [place] [Fish Allergy] Hives    Allergies as of 11/18/2018      Reactions   Nitrofurantoin Monohyd Macro Anaphylaxis   Penicillins Anaphylaxis   Shellfish Allergy Anaphylaxis   Blain Pais Allergy] Anaphylaxis   Clarithromycin Other (See Comments)   Face turns red.   Crestor [rosuvastatin]    Food    tuna   Iodinated Diagnostic Agents    Prev CT reports state anaphylaxis with IV contrast, pt also states severe reaction with contrast   Levofloxacin    Levofloxacin Other (See Comments)   Face turned red.   Lipitor [atorvastatin Calcium]    Lipitor [atorvastatin Calcium] Other (See Comments)   Muscle pain   Macrolides And Ketolides    Nitrofurantoin Monohyd Macro    Penicillins    Percocet [oxycodone-acetaminophen]    Percocet [oxycodone-acetaminophen] Swelling   Sulfa Antibiotics    Sulfa Antibiotics Swelling   Tuna [fish Allergy] Hives      Medication List       Accurate as of Nov 18, 2018  1:28 PM. Always use your most recent med list.        albuterol 108 (90 Base) MCG/ACT inhaler Commonly known as:  VENTOLIN HFA  INHALE 2 PUFFS BY MOUTH EVERY 4 HOURS AS NEEDED FOR WHEEZING OR  SHORTNESS  OF  BREATH   ALPRAZolam 0.25 MG tablet Commonly known as:  XANAX Take 0.5-1 tablets (0.125-0.25 mg total) by mouth daily as needed for anxiety.   aspirin EC 81 MG tablet Take 81 mg by mouth daily.   buPROPion 150 MG 12 hr tablet Commonly known as:  WELLBUTRIN SR TAKE 1 TABLET BY MOUTH TWICE DAILY   cholecalciferol 1000 units tablet Commonly known as:  VITAMIN D Take 2,000 Units by mouth 2 (two) times daily.  conjugated estrogens vaginal cream Commonly known as:  PREMARIN Place 0.5 Applicatorfuls vaginally 2 (two) times a week.   dicyclomine 20 MG tablet Commonly known as:  BENTYL TAKE ONE TABLET BY MOUTH EVERY 6 HOURS   famotidine 20 MG tablet Commonly known as:  PEPCID Take 20 mg by mouth 2 (two) times daily.   fluticasone 50 MCG/ACT nasal spray Commonly known as:  FLONASE USE TWO SPRAY(S) IN EACH NOSTRIL ONCE DAILY   glucose blood test strip Commonly known as:  OneTouch Verio Use as instructed to check blood sugar once a day at various times-Dx code E11.9   metFORMIN 500 MG 24 hr tablet Commonly known as:  GLUCOPHAGE-XR Take 2 tablets by mouth twice daily   REPATHA [place] Inject into the skin.   Evolocumab 140 MG/ML [place] Commonly known as:  Repatha SureClick Inject 884 mg into the skin every 14 (fourteen) days.   risedronate 35 MG tablet Commonly known as:  ACTONEL TAKE 1 TABLET BY MOUTH ONCE A WEEK WITH WATER ON EMPTY STOMACH, NOTHING BY MOUTH OR LIE  DOWN  FOR NEXT 30 MINUTES   telmisartan 40 MG tablet Commonly known as:  MICARDIS Take 1 tablet (40 mg total) by mouth daily.        Review of Systems   LIPIDS: She is on Repatha from her cardiologist and has had excellent control  DIABETES: She has type 2 diabetes diagnosed in 2003.   Has been on metformin 1000 mg ER twice a day by her PCP and this has been continued unchanged Recently with her A1c being higher her PCP has  asked her to start [place] but she was hesitant to start this because of fear of getting UTI  She checks her blood sugars occasionally with a One Touch monitor:  Checking blood sugar at different times of the day with a range of 78-264 and some readings after meals Has sporadic high readings after meals Fasting readings are averaging about 120 Lab glucose 135 fasting  OVERALL median blood sugar is 116+/-39  Previously she had a consultation with the dietitian in 6/18  She is fairly active and able to walk regularly However has not been able to lose weight  A1c as follows:  Wt Readings from Last 3 Encounters:  07/30/18 185 lb 3.2 oz (84 kg)  06/17/18 180 lb (81.6 kg)  04/23/18 178 lb 6.4 oz (80.9 kg)    Lab Results  Component Value Date   HGBA1C 7.0 (H) 07/30/2018   HGBA1C 7.0 (A) 01/21/2018   HGBA1C 7.1 (H) 11/16/2017   Lab Results  Component Value Date   MICROALBUR <0.7 11/16/2017   LDLCALC 26 07/30/2018   CREATININE 1.20 (H) 07/30/2018    LABS:  No visits with results within 1 Week(s) from this visit.  Latest known visit with results is:  Office Visit on 07/30/2018  Component Date Value Ref Range Status  . Color, UA 07/30/2018 yellow  yellow Final  . Clarity, UA 07/30/2018 cloudy* clear Final  . Glucose, UA 07/30/2018 negative  negative mg/dL Final  . Bilirubin, UA 07/30/2018 negative  negative Final  . Ketones, POC UA 07/30/2018 negative  negative mg/dL Final  . Spec Grav, UA 07/30/2018 1.015  1.010 - 1.025 Final  . Blood, UA 07/30/2018 negative  negative Final  . pH, UA 07/30/2018 5.5  5.0 - 8.0 Final  . Protein Ur, POC 07/30/2018 negative  negative mg/dL Final  . Urobilinogen, UA 07/30/2018 0.2  0.2 or 1.0 E.U./dL Final  .  Nitrite, UA 07/30/2018 Negative  Negative Final  . Leukocytes, UA 07/30/2018 Negative  Negative Final  . Hgb A1c MFr Bld 07/30/2018 7.0* 4.8 - 5.6 % Final   Comment:          Prediabetes: 5.7 - 6.4          Diabetes: >6.4           Glycemic control for adults with diabetes: <7.0   . Est. average glucose Bld gHb Est-m* 07/30/2018 154  mg/dL Final  . Glucose 07/30/2018 227* 65 - 99 mg/dL Final  . BUN 07/30/2018 33* 8 - 27 mg/dL Final  . Creatinine, Ser 07/30/2018 1.20* 0.57 - 1.00 mg/dL Final  . GFR calc non Af Amer 07/30/2018 45* >59 mL/min/1.73 Final  . GFR calc Af Amer 07/30/2018 52* >59 mL/min/1.73 Final  . BUN/Creatinine Ratio 07/30/2018 28  12 - 28 Final  . Sodium 07/30/2018 133* 134 - 144 mmol/L Final  . Potassium 07/30/2018 4.8  3.5 - 5.2 mmol/L Final  . Chloride 07/30/2018 100  96 - 106 mmol/L Final  . CO2 07/30/2018 16* 20 - 29 mmol/L Final  . Calcium 07/30/2018 9.7  8.7 - 10.3 mg/dL Final  . Total Protein 07/30/2018 6.7  6.0 - 8.5 g/dL Final  . Albumin 07/30/2018 4.3  3.5 - 4.8 g/dL Final   Comment:     **Effective August 05, 2018 Albumin reference**       interval will be changing to:              Age                Female          Female           0 -  7 days        3.6 - 4.9      3.6 - 4.9           8 - 30 days        3.4 - 4.7      3.4 - 4.7           1 -  6 month       3.7 - 4.8      3.7 - 4.8    7 months -  2 years       3.9 - 5.0      3.9 - 5.0           3 -  5 years       4.0 - 5.0      4.0 - 5.0           6 - 12 years       4.1 - 5.0      4.0 - 5.0          13 - 30 years       4.1 - 5.2      3.9 - 5.0          31 - 50 years       4.0 - 5.0      3.8 - 4.8          51 - 60 years       3.8 - 4.9      3.8 - 4.9          61 - 70 years       3.8 - 4.8      3.8 - 4.8  71 - 80 years       3.7 - 4.7      3.7 - 4.7          81 - 89 years       3.6 - 4.6      3.6 - 4.6              >89 years       3.5 - 4.6      3.5 - 4.6   . Globulin, Total 07/30/2018 2.4  1.5 - 4.5 g/dL Final  . Albumin/Globulin Ratio 07/30/2018 1.8  1.2 - 2.2 Final  . Bilirubin Total 07/30/2018 0.3  0.0 - 1.2 mg/dL Final  . Alkaline Phosphatase 07/30/2018 96  39 - 117 IU/L Final  . AST 07/30/2018 16  0 - 40 IU/L Final  .  ALT 07/30/2018 14  0 - 32 IU/L Final  . Cholesterol, Total 07/30/2018 131  100 - 199 mg/dL Final  . Triglycerides 07/30/2018 315* 0 - 149 mg/dL Final  . HDL 07/30/2018 42  >39 mg/dL Final  . VLDL Cholesterol Cal 07/30/2018 63* 5 - 40 mg/dL Final  . LDL Calculated 07/30/2018 26  0 - 99 mg/dL Final  . Chol/HDL Ratio 07/30/2018 3.1  0.0 - 4.4 ratio Final   Comment:                                   T. Chol/HDL Ratio                                             Men  Women                               1/2 Avg.Risk  3.4    3.3                                   Avg.Risk  5.0    4.4                                2X Avg.Risk  9.6    7.1                                3X Avg.Risk 23.4   11.0      PHYSICAL EXAM:  There were no vitals taken for this visit.     ASSESSMENT:   OSTEOPOROSIS with low T score of the femoral neck and previously having bone density  She is taking Actonel 35 mg weekly for the last 24 months without side effects Also has mild vitamin D deficiency which was supplemented with higher doses since 2019  Although her bone density at the left femur neck was slightly lower it was the same at the left femur neck and improved at the spine  Discussed that since she is asymptomatic and is on better vitamin D replacement therapy would like to continue her on risedronate weekly anyway and may take time to see stabilization of bone density Likely needs treatment for 5-years Previously had not wanted  to do Prolia or Reclast because of the cost   Diabetes: Followed by PCP  LIPIDS: She is asking about continuing Repatha because of the reported side effects of potentially making the blood sugar go up We discussed that the benefits are much greater than any potential risks and she is already getting management of her diabetes adequately   PLAN:    She will need to continue Actonel weekly  Bone density at 2-year intervals To check vitamin D level on the next visit when she  is able to have labs done  Elayne Snare 11/18/2018, 1:28 PM

## 2018-11-22 ENCOUNTER — Ambulatory Visit: Payer: Medicare Other | Admitting: Endocrinology

## 2018-11-25 ENCOUNTER — Telehealth: Payer: Self-pay

## 2018-11-25 NOTE — Telephone Encounter (Signed)
Pt overdue for numerous health maintenance items.  L/m for pt to c/b and schedule OV or confirm if she has established with another clinic.

## 2018-11-28 ENCOUNTER — Other Ambulatory Visit: Payer: Self-pay

## 2018-11-28 ENCOUNTER — Telehealth: Payer: Medicare Other | Admitting: Family Medicine

## 2018-12-30 ENCOUNTER — Other Ambulatory Visit: Payer: Self-pay

## 2018-12-30 DIAGNOSIS — E7800 Pure hypercholesterolemia, unspecified: Secondary | ICD-10-CM

## 2018-12-30 MED ORDER — REPATHA SURECLICK 140 MG/ML ~~LOC~~ SOAJ: 140.0000 mg | SUBCUTANEOUS | 11 refills | Status: DC

## 2019-02-28 ENCOUNTER — Ambulatory Visit (INDEPENDENT_AMBULATORY_CARE_PROVIDER_SITE_OTHER): Payer: Medicare Other | Admitting: Family Medicine

## 2019-02-28 ENCOUNTER — Encounter: Payer: Self-pay | Admitting: Family Medicine

## 2019-02-28 ENCOUNTER — Other Ambulatory Visit: Payer: Self-pay

## 2019-02-28 VITALS — BP 134/69 | HR 71 | Temp 98.5°F | Ht 63.0 in | Wt 185.0 lb

## 2019-02-28 DIAGNOSIS — E7800 Pure hypercholesterolemia, unspecified: Secondary | ICD-10-CM | POA: Diagnosis not present

## 2019-02-28 DIAGNOSIS — J301 Allergic rhinitis due to pollen: Secondary | ICD-10-CM | POA: Diagnosis not present

## 2019-02-28 DIAGNOSIS — E119 Type 2 diabetes mellitus without complications: Secondary | ICD-10-CM

## 2019-02-28 DIAGNOSIS — E559 Vitamin D deficiency, unspecified: Secondary | ICD-10-CM

## 2019-02-28 DIAGNOSIS — Z6832 Body mass index (BMI) 32.0-32.9, adult: Secondary | ICD-10-CM | POA: Diagnosis not present

## 2019-02-28 DIAGNOSIS — I10 Essential (primary) hypertension: Secondary | ICD-10-CM

## 2019-02-28 LAB — POCT GLYCOSYLATED HEMOGLOBIN (HGB A1C): Hemoglobin A1C: 7.3 % — AB (ref 4.0–5.6)

## 2019-02-28 MED ORDER — TELMISARTAN 40 MG PO TABS: 40.0000 mg | ORAL_TABLET | Freq: Every day | ORAL | 3 refills | Status: DC

## 2019-02-28 MED ORDER — FLUTICASONE PROPIONATE 50 MCG/ACT NA SUSP: NASAL | 4 refills | Status: DC

## 2019-02-28 MED ORDER — ONETOUCH VERIO VI STRP: ORAL_STRIP | 3 refills | Status: DC

## 2019-02-28 MED ORDER — TRULICITY 0.75 MG/0.5ML ~~LOC~~ SOAJ: 0.7500 mg | SUBCUTANEOUS | 3 refills | Status: DC

## 2019-02-28 NOTE — Progress Notes (Signed)
8/14/20203:28 PM  Alison Friedman 07-20-1945, 73 y.o., female [id number]  Chief Complaint  Patient presents with  . Diabetes    F/u     HPI:   Patient is a 73 y.o. female with past medical history significant for DM2, HTN, IBS, osteoporosis, vitamin D deficiency, vaginal atrophy, HLP with statin intolerance who presents today for routine followup  Last OV Jan 2020  Patient Care Team: Rutherford Guys, MD as PCP - General (Family Medicine) Frederico Hamman, MD (Inactive) (Obstetrics and Gynecology) Willey Blade, MD (General Surgery) Breck Coons, MD (Internal Medicine) Adrian Prows, MD as Consulting Physician (Cardiology) Elayne Snare, MD as Consulting Physician (Endocrinology)  Since our last OV has seen endo, decreased actonel to 30m due to GI sx and tolerating well  Did not tolerate wellbutrin, causing shakes and ringing, makes her essential tremors worse  Complaining of weight gain that she has happened since she quit smoking  She has tried eating healthier at home Limited exercise due to left knee pain Has established relationship with ortho  Needs refills of her strips, checks once a day Has never tried GLP1, did well with jTonga In nov she will be doing her low ct scan for smoker, quit 2 years ago  Wt Readings from Last 3 Encounters:  02/28/19 185 lb (83.9 kg)  07/30/18 185 lb 3.2 oz (84 kg)  06/17/18 180 lb (81.6 kg)   Lab Results  Component Value Date   HGBA1C 7.0 (H) 07/30/2018   HGBA1C 7.0 (A) 01/21/2018   HGBA1C 7.1 (H) 11/16/2017   Lab Results  Component Value Date   MICROALBUR <0.7 11/16/2017   LFrederick26 07/30/2018   CREATININE 1.20 (H) 07/30/2018    Depression screen PHQ 2/9 02/28/2019 07/30/2018 06/17/2018  Decreased Interest 0 0 0  Down, Depressed, Hopeless 1 0 0  PHQ - 2 Score 1 0 0  Altered sleeping - - -  Tired, decreased energy - - -  Change in appetite - - -  Feeling bad or failure about yourself  - - -  Trouble  concentrating - - -  Moving slowly or fidgety/restless - - -  Suicidal thoughts - - -  PHQ-9 Score - - -  Difficult doing work/chores - - -    Fall Risk  02/28/2019 07/30/2018 06/17/2018 06/17/2018 01/21/2018  Falls in the past year? 0 0 0 0 No  Number falls in past yr: 0 - - - -  Injury with Fall? 0 - - - -  Follow up Falls evaluation completed - - - -     Allergies  Allergen Reactions  . Nitrofurantoin Monohyd Macro Anaphylaxis  . Penicillins Anaphylaxis  . Shellfish Allergy Anaphylaxis  . Tuna [Fish Allergy] Anaphylaxis  . Clarithromycin Other (See Comments)    Face turns red.  . Crestor [Rosuvastatin]   . Food     tuna  . Iodinated Diagnostic Agents     Prev CT reports state anaphylaxis with IV contrast, pt also states severe reaction with contrast  . Levofloxacin   . Levofloxacin Other (See Comments)    Face turned red.  . Lipitor [Atorvastatin Calcium]   . Lipitor [Atorvastatin Calcium] Other (See Comments)    Muscle pain  . Macrolides And Ketolides   . Nitrofurantoin Monohyd Macro   . Penicillins   . Percocet [Oxycodone-Acetaminophen]   . Percocet [Oxycodone-Acetaminophen] Swelling  . Sulfa Antibiotics   . Sulfa Antibiotics Swelling  . TGeralyn Flash[Fish Allergy] Hives  Prior to Admission medications   Medication Sig Start Date End Date Taking? Authorizing Provider  albuterol (PROVENTIL HFA;VENTOLIN HFA) 108 (90 Base) MCG/ACT inhaler INHALE 2 PUFFS BY MOUTH EVERY 4 HOURS AS NEEDED FOR WHEEZING OR  SHORTNESS  OF  BREATH 10/01/18  Yes Rutherford Guys, MD  ALPRAZolam Duanne Moron) 0.25 MG tablet Take 0.5-1 tablets (0.125-0.25 mg total) by mouth daily as needed for anxiety. 10/22/17  Yes Tereasa Coop, PA-C  cholecalciferol (VITAMIN D) 1000 units tablet Take 5,000 Units by mouth daily.    Yes [provider]  conjugated estrogens (PREMARIN) vaginal cream Place 0.5 Applicatorfuls vaginally 2 (two) times a week. 06/17/18  Yes Rutherford Guys, MD  dicyclomine (BENTYL) 20 MG  tablet TAKE ONE TABLET BY MOUTH EVERY 6 HOURS 04/08/15  Yes Daub, Loura Back, MD  Evolocumab (REPATHA SURECLICK) 220 MG/ML SOAJ Inject 140 mg into the skin every 14 (fourteen) days. 12/30/18  Yes Adrian Prows, MD  fluticasone [facility]) 50 MCG/ACT nasal spray USE TWO SPRAY(S) IN EACH NOSTRIL ONCE DAILY 04/23/18  Yes Sagardia, [place], MD  glucose blood (ONETOUCH VERIO) test strip Use as instructed to check blood sugar once a day at various times-Dx code E11.9 06/05/17  Yes Elayne Snare, MD  metFORMIN (GLUCOPHAGE-XR) 500 MG 24 hr tablet Take 2 tablets by mouth twice daily 09/23/18  Yes Rutherford Guys, MD  risedronate (ACTONEL) 35 MG tablet TAKE 1 TABLET BY MOUTH ONCE A WEEK WITH WATER ON EMPTY STOMACH, NOTHING BY MOUTH OR LIE  DOWN  FOR NEXT 30 MINUTES 11/14/18  Yes Elayne Snare, MD  telmisartan (MICARDIS) 40 MG tablet Take 1 tablet (40 mg total) by mouth daily. 04/23/18 02/28/19 Yes SagardiaInes Bloomer, MD  aspirin EC 81 MG tablet Take 81 mg by mouth daily.    [provider]  buPROPion (WELLBUTRIN SR) 150 MG 12 hr tablet TAKE 1 TABLET BY MOUTH TWICE DAILY Patient not taking: Reported on 02/28/2019 09/05/18   Rutherford Guys, MD  famotidine (PEPCID) 20 MG tablet Take 20 mg by mouth 2 (two) times daily.    [provider]    Past Medical History:  Diagnosis Date  . Abnormal CT scan, chest    stable  . Allergy   . Anxiety   . Asthma   . Asthma   . Cervical disc disease   . Diabetes mellitus   . Diabetes mellitus without complication ([place])   . Discoid lupus   . Gallbladder polyp   . Hyperlipidemia   . Hypertension   . Lymphadenopathy   . Postmenopausal HRT (hormone replacement therapy)   . Pulmonary embolism ([place]) At age 57    With associated DVT  . Renal cyst   . Shingles 12/2010    Past Surgical History:  Procedure Laterality Date  . APPENDECTOMY     child  . APPENDECTOMY    . BREAST SURGERY    . LAPAROSCOPIC HELLER MYOTOMY    . NECK SURGERY     Disk and rod  .  SPINE SURGERY  2004   c spine  . TONSILLECTOMY AND ADENOIDECTOMY    . TUBAL LIGATION      Social History   Tobacco Use  . Smoking status: Current Every Day Smoker    Packs/day: 0.25    Years: 35.00    Pack years: 8.75    Types: Cigarettes    Last attempt to quit: 01/24/2015    Years since quitting: 4.0  . Smokeless tobacco: Former Systems developer  .  Tobacco comment: Encouraged to remain smoke free.  Substance Use Topics  . Alcohol use: No    Alcohol/week: 0.0 standard drinks    Comment: 2 glasses weekly    Family History  Problem Relation Age of Onset  . Osteoporosis Mother   . Osteoporosis Sister   . Breast cancer Daughter 71  . Dementia Maternal Grandmother   . Stroke Maternal Grandfather     Review of Systems  Constitutional: Negative for chills and fever.  Respiratory: Negative for cough and shortness of breath.   Cardiovascular: Negative for chest pain, palpitations and leg swelling.  Gastrointestinal: Negative for abdominal pain, nausea and vomiting.     OBJECTIVE:  Today's Vitals   02/28/19 1524  BP: 134/69  Pulse: 71  Temp: 98.5 F (36.9 C)  TempSrc: Oral  SpO2: 97%  Weight: 185 lb (83.9 kg)  Height: _0  (1.6 m)   Body mass index is 32.77 kg/m.  Diabetic Foot Exam - Simple   Simple Foot Form Diabetic Foot exam was performed with the following findings: Yes 02/28/2019  3:27 PM  Visual Inspection No deformities, no ulcerations, no other skin breakdown bilaterally: Yes Sensation Testing Intact to touch and monofilament testing bilaterally: Yes Pulse Check Posterior Tibialis and Dorsalis pulse intact bilaterally: Yes Comments     Physical Exam Vitals signs and nursing note reviewed.  Constitutional:      Appearance: She is well-developed.  HENT:     Head: Normocephalic and atraumatic.     Mouth/Throat:     Pharynx: No oropharyngeal exudate.  Eyes:     General: No scleral icterus.    Conjunctiva/sclera: Conjunctivae normal.     Pupils: Pupils  are equal, round, and reactive to light.  Neck:     Musculoskeletal: Neck supple.  Cardiovascular:     Rate and Rhythm: Normal rate and regular rhythm.     Heart sounds: Normal heart sounds. No murmur. No friction rub. No gallop.   Pulmonary:     Effort: Pulmonary effort is normal.     Breath sounds: Normal breath sounds. No wheezing or rales.  Skin:    General: Skin is warm and dry.  Neurological:     Mental Status: She is alert and oriented to person, place, and time.     Results for orders placed or performed in visit on 02/28/19 (from the past 24 hour(s))  POCT glycosylated hemoglobin (Hb A1C)     Status: Abnormal   Collection Time: 02/28/19  3:55 PM  Result Value Ref Range   Hemoglobin A1C 7.3 (A) 4.0 - 5.6 %   HbA1c POC (<> result, manual entry)     HbA1c, POC (prediabetic range)     HbA1c, POC (controlled diabetic range)        ASSESSMENT and PLAN  1. Type 2 diabetes mellitus without complication, without long-term current use of insulin (HCC) Worse. Starting trulicity, reviewed r/se/b. cont metformin. Patient to do first dose of truliicty in clinic given sign h/o anaphylaxis - POCT glycosylated hemoglobin (Hb A1C) - Lipid panel - CMP14+EGFR - Microalbumin / creatinine urine ratio - HM DIABETES FOOT EXAM - Ambulatory referral to Ophthalmology - Amb Ref to Medical Weight Management - telmisartan (MICARDIS) 40 MG tablet; Take 1 tablet (40 mg total) by mouth daily.  2. Essential hypertension Controlled. Continue current regime.  - Lipid panel - CMP14+EGFR - Amb Ref to Medical Weight Management  3. Vitamin D deficiency Checking labs today, medications will be adjusted as needed.  - VITAMIN  D 25 Hydroxy (Vit-D Deficiency, Fractures)  4. BMI 00.1-64.2,XIPPN trulicity should help with weight loss as well. - Amb Ref to Medical Weight Management  5. Pure hypercholesterolemia On repatha, per cards - Amb Ref to Medical Weight Management  6. Seasonal allergic  rhinitis due to pollen - fluticasone (FLONASE) 50 MCG/ACT nasal spray; USE TWO SPRAY(S) IN EACH NOSTRIL ONCE DAILY  Other orders - glucose blood (ONETOUCH VERIO) test strip; Use as instructed to check blood sugar once a day at various times-Dx code E11.9 - Dulaglutide (TRULICITY) 5.58 PR/6.7OA SOPN; Inject 0.75 mg into the skin once a week.  Return in about 3 months (around 05/31/2019) for DM.    Rutherford Guys, MD [facility] [place], [place] [address] Ph.  [phone] Fax [phone]

## 2019-02-28 NOTE — Patient Instructions (Signed)
° ° ° °  If you have lab work done today you will be contacted with your lab results within the next 2 weeks.  If you have not heard from us then please contact us. The fastest way to get your results is to register for My Chart. ° ° °IF you received an x-ray today, you will receive an invoice from [facility]. Please contact [facility] at [phone] with questions or concerns regarding your invoice.  ° °IF you received labwork today, you will receive an invoice from [facility]. Please contact [facility] at [phone] with questions or concerns regarding your invoice.  ° °Our billing staff will not be able to assist you with questions regarding bills from these companies. ° °You will be contacted with the lab results as soon as they are available. The fastest way to get your results is to activate your My Chart account. Instructions are located on the last page of this paperwork. If you have not heard from us regarding the results in 2 weeks, please contact this office. °  ° ° ° °

## 2019-03-01 LAB — CMP14+EGFR
ALT: 13 IU/L (ref 0–32)
AST: 13 IU/L (ref 0–40)
Albumin/Globulin Ratio: 2 (ref 1.2–2.2)
Albumin: 4.3 g/dL (ref 3.7–4.7)
Alkaline Phosphatase: 95 IU/L (ref 39–117)
BUN/Creatinine Ratio: 27 (ref 12–28)
BUN: 31 mg/dL — ABNORMAL HIGH (ref 8–27)
Bilirubin Total: 0.3 mg/dL (ref 0.0–1.2)
CO2: 20 mmol/L (ref 20–29)
Calcium: 9.6 mg/dL (ref 8.7–10.3)
Chloride: 104 mmol/L (ref 96–106)
Creatinine, Ser: 1.16 mg/dL — ABNORMAL HIGH (ref 0.57–1.00)
GFR calc Af Amer: 54 mL/min/{1.73_m2} — ABNORMAL LOW (ref >59)
GFR calc non Af Amer: 47 mL/min/{1.73_m2} — ABNORMAL LOW (ref >59)
Globulin, Total: 2.1 g/dL (ref 1.5–4.5)
Glucose: 87 mg/dL (ref 65–99)
Potassium: 4.9 mmol/L (ref 3.5–5.2)
Sodium: 139 mmol/L (ref 134–144)
Total Protein: 6.4 g/dL (ref 6.0–8.5)

## 2019-03-01 LAB — LIPID PANEL
Chol/HDL Ratio: 3 ratio (ref 0.0–4.4)
Cholesterol, Total: 124 mg/dL (ref 100–199)
HDL: 42 mg/dL (ref >39)
LDL Calculated: 36 mg/dL (ref 0–99)
Triglycerides: 228 mg/dL — ABNORMAL HIGH (ref 0–149)
VLDL Cholesterol Cal: 46 mg/dL — ABNORMAL HIGH (ref 5–40)

## 2019-03-01 LAB — MICROALBUMIN / CREATININE URINE RATIO
Creatinine, Urine: 102.1 mg/dL
Microalb/Creat Ratio: 5 mg/g creat (ref 0–29)
Microalbumin, Urine: 4.9 ug/mL

## 2019-03-01 LAB — VITAMIN D 25 HYDROXY (VIT D DEFICIENCY, FRACTURES): Vit D, 25-Hydroxy: 50.2 ng/mL (ref 30.0–100.0)

## 2019-03-07 ENCOUNTER — Telehealth: Payer: Self-pay

## 2019-03-07 NOTE — Telephone Encounter (Signed)
Pt to clinic.  Reviewed Trulicity instructions, storage, etc.  Medication administered while in clinic d/t pt h/o anaphylactic reactions.  Monitored for 25 minutes.  No changes.  Pt states she feels fine and that if she were going to have a reaction it would have happened by now.  Pt felt comfortable leaving clinic without additional observation.

## 2019-03-13 ENCOUNTER — Telehealth: Payer: Self-pay | Admitting: *Deleted

## 2019-03-13 DIAGNOSIS — H52201 Unspecified astigmatism, right eye: Secondary | ICD-10-CM | POA: Diagnosis not present

## 2019-03-13 DIAGNOSIS — H40033 Anatomical narrow angle, bilateral: Secondary | ICD-10-CM | POA: Diagnosis not present

## 2019-03-13 DIAGNOSIS — E119 Type 2 diabetes mellitus without complications: Secondary | ICD-10-CM | POA: Diagnosis not present

## 2019-03-13 DIAGNOSIS — H2513 Age-related nuclear cataract, bilateral: Secondary | ICD-10-CM | POA: Diagnosis not present

## 2019-03-13 LAB — HM DIABETES EYE EXAM

## 2019-03-13 NOTE — Telephone Encounter (Signed)
SCHEDULE AWV 

## 2019-03-14 DIAGNOSIS — E119 Type 2 diabetes mellitus without complications: Secondary | ICD-10-CM | POA: Diagnosis not present

## 2019-03-14 DIAGNOSIS — R202 Paresthesia of skin: Secondary | ICD-10-CM | POA: Diagnosis not present

## 2019-03-14 DIAGNOSIS — L989 Disorder of the skin and subcutaneous tissue, unspecified: Secondary | ICD-10-CM | POA: Diagnosis not present

## 2019-03-28 DIAGNOSIS — Z803 Family history of malignant neoplasm of breast: Secondary | ICD-10-CM | POA: Diagnosis not present

## 2019-03-28 DIAGNOSIS — Z1231 Encounter for screening mammogram for malignant neoplasm of breast: Secondary | ICD-10-CM | POA: Diagnosis not present

## 2019-03-31 ENCOUNTER — Other Ambulatory Visit: Payer: Self-pay | Admitting: Cardiology

## 2019-03-31 DIAGNOSIS — E782 Mixed hyperlipidemia: Secondary | ICD-10-CM | POA: Diagnosis not present

## 2019-04-01 LAB — LIPID PANEL W/O CHOL/HDL RATIO
Cholesterol, Total: 136 mg/dL (ref 100–199)
HDL: 43 mg/dL (ref >39)
LDL Chol Calc (NIH): 60 mg/dL (ref 0–99)
Triglycerides: 199 mg/dL — ABNORMAL HIGH (ref 0–149)
VLDL Cholesterol Cal: 33 mg/dL (ref 5–40)

## 2019-04-02 ENCOUNTER — Other Ambulatory Visit: Payer: Self-pay

## 2019-04-02 DIAGNOSIS — E7800 Pure hypercholesterolemia, unspecified: Secondary | ICD-10-CM

## 2019-04-02 MED ORDER — REPATHA SURECLICK 140 MG/ML ~~LOC~~ SOAJ: 140.0000 mg | SUBCUTANEOUS | 3 refills | Status: DC

## 2019-04-09 ENCOUNTER — Encounter: Payer: Self-pay | Admitting: Cardiology

## 2019-04-09 ENCOUNTER — Ambulatory Visit (INDEPENDENT_AMBULATORY_CARE_PROVIDER_SITE_OTHER): Payer: Medicare Other | Admitting: Cardiology

## 2019-04-09 ENCOUNTER — Other Ambulatory Visit: Payer: Self-pay

## 2019-04-09 VITALS — BP 136/74 | HR 68 | Temp 96.6°F | Ht 63.0 in | Wt 183.9 lb

## 2019-04-09 DIAGNOSIS — E782 Mixed hyperlipidemia: Secondary | ICD-10-CM | POA: Diagnosis not present

## 2019-04-09 DIAGNOSIS — I2583 Coronary atherosclerosis due to lipid rich plaque: Secondary | ICD-10-CM

## 2019-04-09 DIAGNOSIS — E118 Type 2 diabetes mellitus with unspecified complications: Secondary | ICD-10-CM | POA: Diagnosis not present

## 2019-04-09 DIAGNOSIS — Z789 Other specified health status: Secondary | ICD-10-CM

## 2019-04-09 DIAGNOSIS — I10 Essential (primary) hypertension: Secondary | ICD-10-CM

## 2019-04-09 DIAGNOSIS — I2510 Atherosclerotic heart disease of native coronary artery without angina pectoris: Secondary | ICD-10-CM | POA: Diagnosis not present

## 2019-04-09 MED ORDER — REPATHA SURECLICK 140 MG/ML ~~LOC~~ SOAJ: 140.0000 mg | SUBCUTANEOUS | 3 refills | Status: DC

## 2019-04-09 NOTE — Progress Notes (Signed)
Primary Physician/Referring:  Rutherford Guys, MD  Patient ID: Alison Friedman, female    DOB: 23-May-1946, 73 y.o.   MRN: [id number]  Chief Complaint  Patient presents with  . Hyperlipidemia  . Hypertension  . Results    Labs  . Follow-up    53yr  HPI:    Alison Friedman is a 74y.o. Caucasian female with severe statin intolerance and hyperlipidemia, DM Controlled and mild hypertension here for a 6 month f/u for hyperlipidemia and mild coronary atheresclerosis (mid LAD 30%). She has not tolerated several statins and other lipid lowering agents in the past; She was started on Repatha in March 2019, she tolerates well, does have flu like symptoms for 2 days after her injection, but able to continue to taking the medication. States DM has improved since last OV a year ago..  She is presently doing well and remains asymptomatic. She presents for annual visit.   Past Medical History:  Diagnosis Date  . Abnormal CT scan, chest    stable  . Allergy   . Anxiety   . Asthma   . Asthma   . Cervical disc disease   . Diabetes mellitus   . Diabetes mellitus without complication ([place]   . Discoid lupus   . Gallbladder polyp   . Hyperlipidemia   . Hypertension   . Lymphadenopathy   . Postmenopausal HRT (hormone replacement therapy)   . [place]ism ([place] 73   With associated DVT  . Renal cyst   . Shingles 12/2010   Past Surgical History:  Procedure Laterality Date  . APPENDECTOMY     child  . APPENDECTOMY    . BREAST SURGERY    . LAPAROSCOPIC HELLER MYOTOMY    . NECK SURGERY     Disk and rod  . SPINE SURGERY  2004   c spine  . TONSILLECTOMY AND ADENOIDECTOMY    . TUBAL LIGATION     Social History   Socioeconomic History  . Marital status: Married    Spouse name: Not on file  . Number of children: 2  . Years of education: 146 . Highest education level: Not on file  Occupational History  . Occupation: caretaker  . Occupation: Retired  SScientific laboratory technician .  Financial resource strain: Not on file  . Food insecurity    Worry: Not on file    Inability: Not on file  . Transportation needs    Medical: Not on file    Non-medical: Not on file  Tobacco Use  . Smoking status: Former Smoker    Packs/day: 0.25    Years: 35.00    Pack years: 8.75    Types: Cigarettes    Quit date: 2018    Years since quitting: 2.7  . Smokeless tobacco: Former USystems developer . Tobacco comment: Encouraged to remain smoke free.  Substance and Sexual Activity  . Alcohol use: Not Currently    Alcohol/week: 0.0 standard drinks  . Drug use: No  . Sexual activity: Yes    Birth control/protection: None  Lifestyle  . Physical activity    Days per week: Not on file    Minutes per session: Not on file  . Stress: Not on file  Relationships  . Social [place] phone: Not on file    Gets together: Not on file    Attends religious service: Not on file    Active member of club or  organization: Not on file    Attends meetings of clubs or organizations: Not on file    Relationship status: Not on file  . Intimate partner violence    Fear of current or ex partner: Not on file    Emotionally abused: Not on file    Physically abused: Not on file    Forced sexual activity: Not on file  Other Topics Concern  . 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  Denies abuse and feels safe at home.        ** Data from: 08/22/11 Enc Dept: [facility]   Exercise--walking daily 20-30 min.   ROS  Review of Systems  Constitution: Negative for chills, decreased appetite, malaise/fatigue and weight gain.  Cardiovascular: Negative for dyspnea on exertion, leg swelling and syncope.  Endocrine: Negative for cold intolerance.  Hematologic/Lymphatic: Does not bruise/bleed easily.  Musculoskeletal: Negative for joint swelling.  Gastrointestinal: Negative for  abdominal pain, anorexia, change in bowel habit, hematochezia and melena.  Neurological: Negative for headaches and light-headedness.  Psychiatric/Behavioral: Negative for depression and substance abuse.  All other systems reviewed and are negative.  Objective   Vitals with BMI 04/09/2019 02/28/2019 07/30/2018  Height 5' 3" 5' 3" 5' 2"  Weight 183 lbs 14 oz 185 lbs 185 lbs 3 oz  BMI 32.58 42.87 68.11  Systolic 572 620 355  Diastolic 74 69 74  Pulse 68 71 72    Blood pressure 136/74, pulse 68, temperature (!) 96.6 F (35.9 C), height 5' 3" (1.6 m), weight 183 lb 14.4 oz (83.4 kg), SpO2 97 %. Body mass index is 32.58 kg/m.   Physical Exam  Constitutional: No distress.  Short stature and mildly obese in no acute distress.  HENT:  Head: Atraumatic.  Eyes: Conjunctivae are normal.  Neck: Neck supple. No JVD present. No thyromegaly present.  Cardiovascular: Normal rate, regular rhythm, normal heart sounds and intact distal pulses. Exam reveals no gallop.  No murmur heard. Pulmonary/Chest: Effort normal and breath sounds normal.  Abdominal: Soft. Bowel sounds are normal.  Musculoskeletal: Normal range of motion.  Neurological: She is alert.  Skin: Skin is warm and dry.  Psychiatric: She has a normal mood and affect.   Radiology: No results found.  Laboratory examination:   Recent Labs    07/30/18 1749 02/28/19 1634  NA 133* 139  K 4.8 4.9  CL 100 104  CO2 16* 20  GLUCOSE 227* 87  BUN 33* 31*  CREATININE 1.20* 1.16*  CALCIUM 9.7 9.6  GFRNONAA 45* 47*  GFRAA 52* 54*   CMP Latest Ref Rng & Units 02/28/2019 07/30/2018 11/16/2017  Glucose 65 - 99 mg/dL 87 227(H) 135(H)  BUN 8 - 27 mg/dL 31(H) 33(H) 21  Creatinine 0.57 - 1.00 mg/dL 1.16(H) 1.20(H) 1.05  Sodium 134 - 144 mmol/L 139 133(L) 141  Potassium 3.5 - 5.2 mmol/L 4.9 4.8 4.2  Chloride 96 - 106 mmol/L 104 100 109  CO2 20 - 29 mmol/L 20 16(L) 25  Calcium 8.7 - 10.3 mg/dL 9.6 9.7 9.0  Total Protein 6.0 - 8.5 g/dL 6.4  6.7 6.3  Total Bilirubin 0.0 - 1.2 mg/dL 0.3 0.3 0.4  Alkaline Phos 39 - 117 IU/L 95 96 78  AST 0 - 40 IU/L _0 ALT 0 - 32 IU/L 13 14 10  CBC Latest Ref Rng & Units 10/22/2017 10/22/2017 05/03/2017  WBC 3.4 - 10.8 x10E3/uL 9.5 9.5 9.2  Hemoglobin 11.1 - 15.9 g/dL 13.1 13.1 12.5  Hematocrit 34.0 - 46.6 % 39.0 39.0 36.6  Platelets 150 - 379 x10E3/uL 423(H) 423(H) 458(H)   Lipid Panel     Component Value Date/Time   CHOL 136 03/31/2019 1408   TRIG 199 (H) 03/31/2019 1408   HDL 43 03/31/2019 1408   CHOLHDL 3.0 02/28/2019 1634   CHOLHDL 3.9 07/01/2015 0921   VLDL 42 (H) 07/01/2015 0921   LDLCALC 36 02/28/2019 1634   HEMOGLOBIN A1C Lab Results  Component Value Date   HGBA1C 7.3 (A) 02/28/2019   MPG 143 (H) 05/09/2011   TSH No results for input(s): TSH in the last 8760 hours. Medications   Allergies  Allergen Reactions  . Nitrofurantoin Monohyd Macro Anaphylaxis  . Penicillins Anaphylaxis  . Shellfish Allergy Anaphylaxis  . Tuna [Fish Allergy] Anaphylaxis  . Clarithromycin Other (See Comments)    Face turns red.  . Crestor [Rosuvastatin]   . Food     tuna  . Iodinated Diagnostic Agents     Prev CT reports state anaphylaxis with IV contrast, pt also states severe reaction with contrast  . Levofloxacin   . Levofloxacin Other (See Comments)    Face turned red.  . Lipitor [Atorvastatin Calcium]   . Lipitor [Atorvastatin Calcium] Other (See Comments)    Muscle pain  . Macrolides And Ketolides   . Nitrofurantoin Monohyd Macro   . Penicillins   . Percocet [Oxycodone-Acetaminophen]   . Percocet [Oxycodone-Acetaminophen] Swelling  . Sulfa Antibiotics   . Sulfa Antibiotics Swelling  . Tuna [Fish Allergy] Hives     Prior to Admission medications   Medication Sig Start Date End Date Taking? Authorizing Provider  albuterol (PROVENTIL HFA;VENTOLIN HFA) 108 (90 Base) MCG/ACT inhaler INHALE 2 PUFFS BY MOUTH EVERY 4 HOURS AS NEEDED FOR WHEEZING OR  SHORTNESS  OF  BREATH  10/01/18  Yes Rutherford Guys, MD  ALPRAZolam Duanne Moron) 0.25 MG tablet Take 0.5-1 tablets (0.125-0.25 mg total) by mouth daily as needed for anxiety. 10/22/17  Yes Tereasa Coop, PA-C  Cholecalciferol (VITAMIN D) 125 MCG (5000 UT) CAPS Take 5,000 Units by mouth daily.    Yes [provider]  conjugated estrogens (PREMARIN) vaginal cream Place 0.5 Applicatorfuls vaginally 2 (two) times a week. Patient taking differently: Place 0.5 Applicatorfuls vaginally every 30 (thirty) days.  06/17/18  Yes Rutherford Guys, MD  dicyclomine (BENTYL) 20 MG tablet TAKE ONE TABLET BY MOUTH EVERY 6 HOURS Patient taking differently: as needed.  04/08/15  Yes [name], MD  Dulaglutide (TRULICITY) 0.94 MH/6.8GS SOPN Inject 0.75 mg into the skin once a week. 02/28/19  Yes Rutherford Guys, MD  Evolocumab (REPATHA SURECLICK) 811 MG/ML SOAJ Inject 140 mg into the skin every 14 (fourteen) days. 04/02/19  Yes Adrian Prows, MD  fluticasone [facility]) 50 MCG/ACT nasal spray USE TWO SPRAY(S) IN EACH NOSTRIL ONCE DAILY 02/28/19  Yes Rutherford Guys, MD  glucose blood (ONETOUCH VERIO) test strip Use as instructed to check blood sugar once a day at various times-Dx code E11.9 02/28/19  Yes Rutherford Guys, MD  metFORMIN (GLUCOPHAGE-XR) 500 MG 24 hr tablet Take 2 tablets by mouth twice daily 09/23/18  Yes Rutherford Guys, MD  pregabalin (LYRICA) 25 MG capsule Take by mouth daily. 03/14/19 03/13/20 Yes [provider]  risedronate (ACTONEL) 35 MG tablet TAKE 1 TABLET BY MOUTH ONCE  A WEEK WITH WATER ON EMPTY STOMACH, NOTHING BY MOUTH OR LIE  DOWN  FOR NEXT 30 MINUTES 11/14/18  Yes Elayne Snare, MD  telmisartan (MICARDIS) 40 MG tablet Take 1 tablet (40 mg total) by mouth daily. 02/28/19 05/29/19 Yes Rutherford Guys, MD     Current Outpatient Medications  Medication Instructions  . albuterol (PROVENTIL HFA;VENTOLIN HFA) 108 (90 Base) MCG/ACT inhaler INHALE 2 PUFFS BY MOUTH EVERY 4 HOURS AS NEEDED FOR WHEEZING OR  SHORTNESS   OF  BREATH  . ALPRAZolam (XANAX) 0.125-0.25 mg, Oral, Daily PRN  . conjugated estrogens (PREMARIN) vaginal cream 0.5 Applicatorfuls, Vaginal, 2 times weekly  . dicyclomine (BENTYL) 20 MG tablet TAKE ONE TABLET BY MOUTH EVERY 6 HOURS  . fluticasone (FLONASE) 50 MCG/ACT nasal spray USE TWO SPRAY(S) IN EACH NOSTRIL ONCE DAILY  . glucose blood (ONETOUCH VERIO) test strip Use as instructed to check blood sugar once a day at various times-Dx code E11.9  . metFORMIN (GLUCOPHAGE-XR) 500 MG 24 hr tablet Take 2 tablets by mouth twice daily  . pregabalin (LYRICA) 25 MG capsule Oral, Daily  . Repatha SureClick 655 mg, Subcutaneous, Every 14 days  . risedronate (ACTONEL) 35 MG tablet TAKE 1 TABLET BY MOUTH ONCE A WEEK WITH WATER ON EMPTY STOMACH, NOTHING BY MOUTH OR LIE  DOWN  FOR NEXT 30 MINUTES  . telmisartan (MICARDIS) 40 mg, Oral, Daily  . Trulicity 3.74 mg, Subcutaneous, Weekly  . Vitamin D 5,000 Units, Oral, Daily    Cardiac Studies:   Exercise Myoview stress test 11/21/2013: 1. Resting EKG showed normal sinus rhythm, poor R wave progression, low voltage complexes. Stress EKG was negative for ischemia. Patient exercised on BRUCE PROTOCOL for 4 minutes 34 seconds. The maximum work level achieved was 7.3 MET's. Hypertensive BP response. The baseline blood pressure was 140/90 mmHg and 208/102 mmHg with exercise. The test was terminated due to achievement of the target heart rate, fatigue and dizziness. 2. Perfusion imaging study demonstrates normal perfusion without ischemia or scar. Normal left ventricular ejection fraction at 69% Impression: EKG 04/08/2018: Normal sinus rhythm at rate of 74 bpm, normal axis, right bundle branch block. No evidence of ischemia. [place] EKG 05/30/2017.  Coronary angiogram 07/20/2005: LAD had a 25% stenosis, otherwise normal coronary arteries. Ejection fraction was normal.  Assessment     ICD-10-CM   1. Coronary atherosclerosis due to lipid rich plaque  I25.10    I25.83    2. Essential hypertension  I10 EKG 12-Lead  3. Hypercholesteremia  E78.00     EKG 04/09/2019: Normal sinus rhythm at rate of 68 bpm, left axis deviation, right bundle branch block.  No evidence of ischemia. No significant change since 19 20/09/2017.  Recommendations:   She is here on annual visit and follow-up of very mild coronary atherosclerosis, statin intolerance for which she is on Repatha, she is tolerating this very well.  I reviewed her labs, lipids are under excellent control with regard to LDL but triglycerides continues to be elevated.  This is clearly related to uncontrolled diabetes mellitus.  She is also now developed stage III chronic kidney disease.  I reviewed with her the labs, continue Repatha for now, but clearly she needs to make changes to her diet and she needs to exercise a little bit more than what she is doing.  I'll see her back in a year.  Adrian Prows, MD, [facility] 04/09/2019, 4:15 PM [facility] [place] Pager: [phone] Office: [phone] If no answer Cell [phone]

## 2019-05-06 IMAGING — US US PELVIS COMPLETE TRANSABD/TRANSVAG
1 series · 15 of 25 positions shown · non-contrast
Comparison: CT 05/16/2017

CLINICAL DATA: Pelvic pain, left lower quadrant



[Series 1: us pelvis complete transabd/transvag · 15 of 57 slices shown]
[im 1/57]
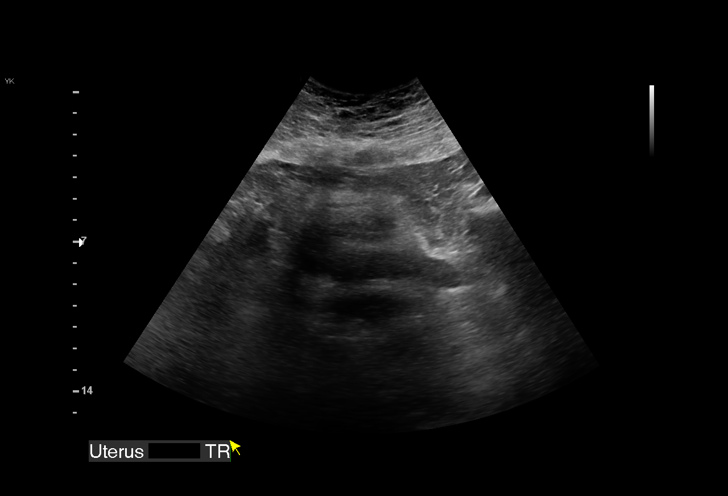
[im 5/57]
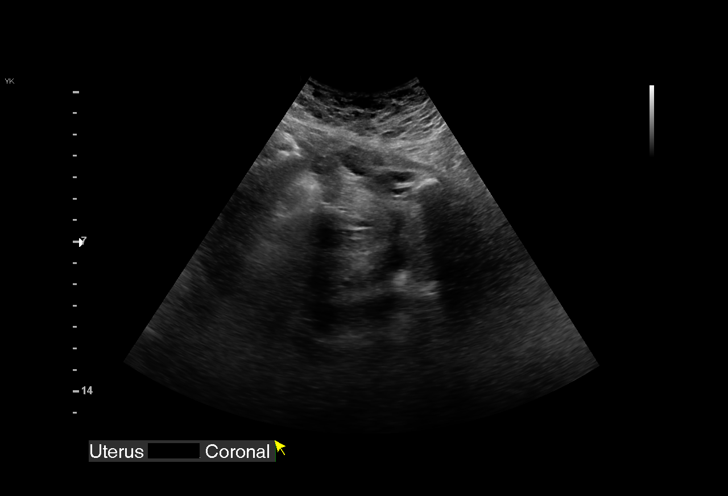
[im 10/57]
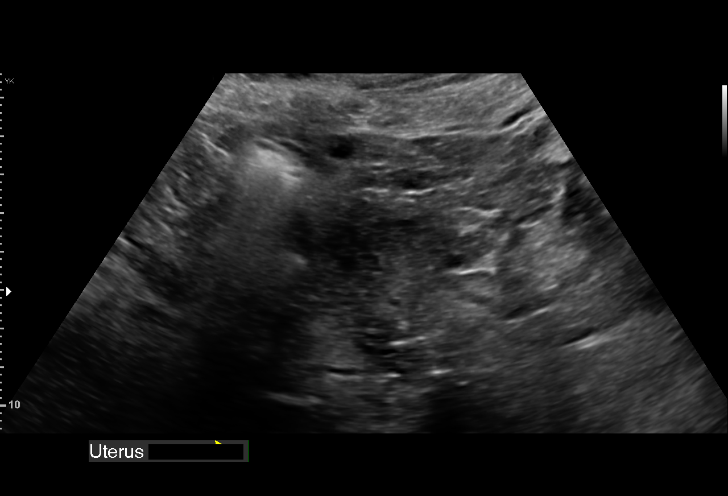
[im 12/57]
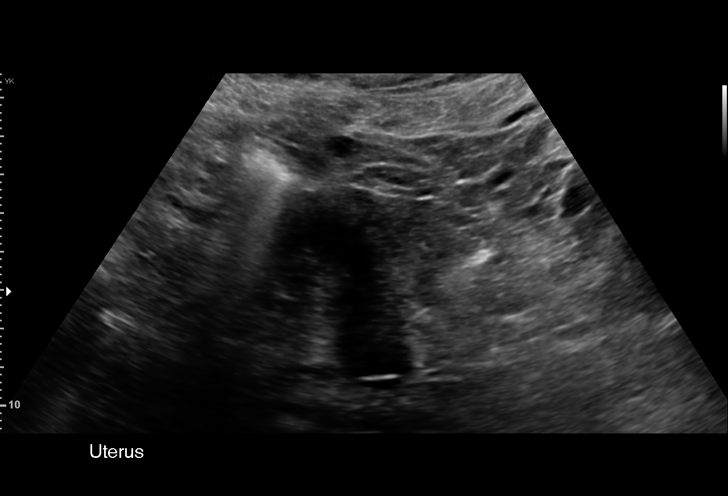
[im 17/57]
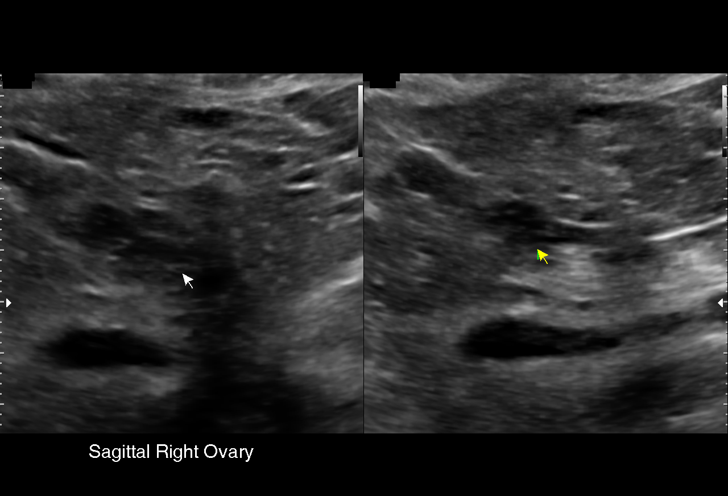
[im 22/57]
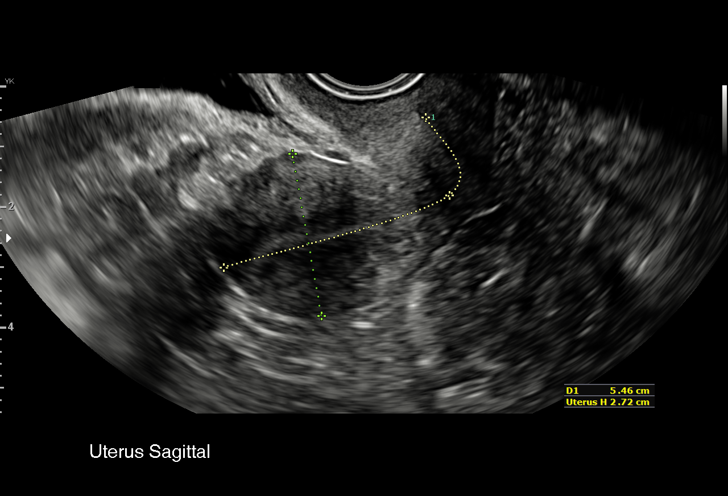
[im 24/57]
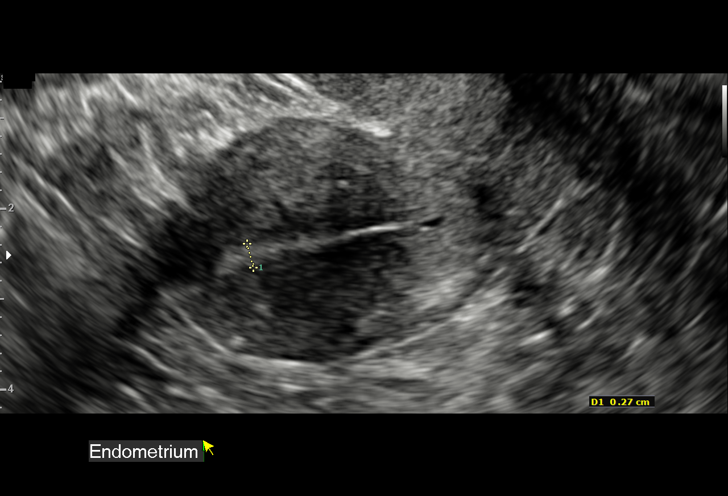
[im 29/57]
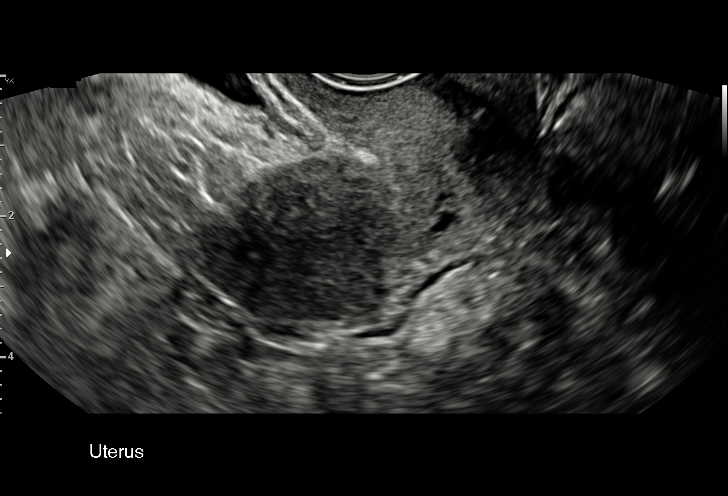
[im 33/57]
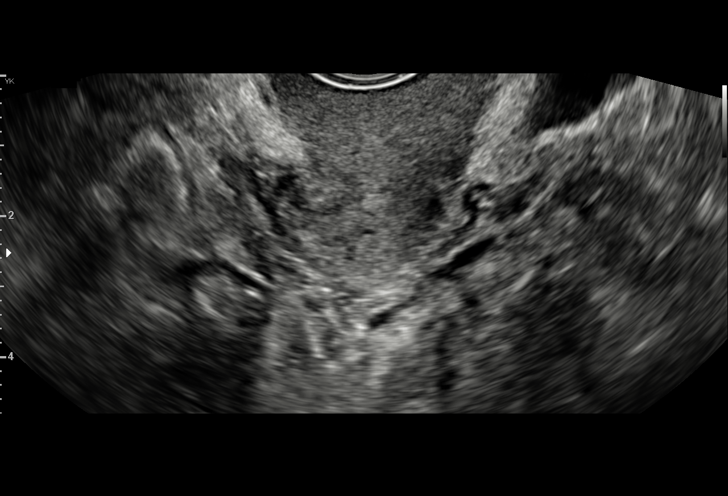
[im 36/57]
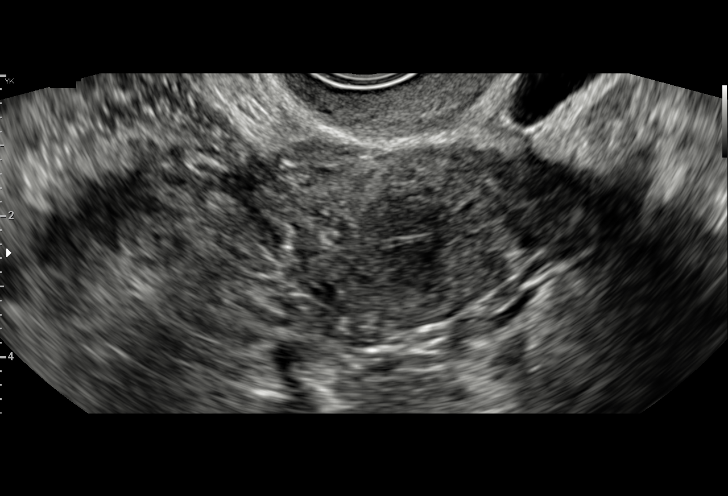
[im 40/57]
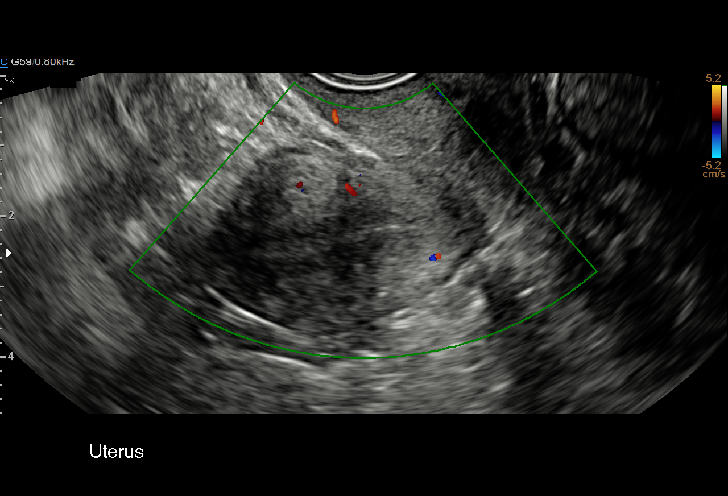
[im 45/57]
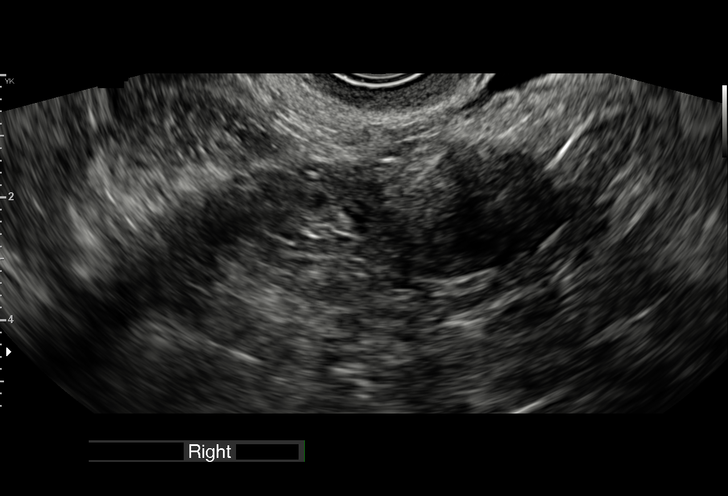
[im 47/57]
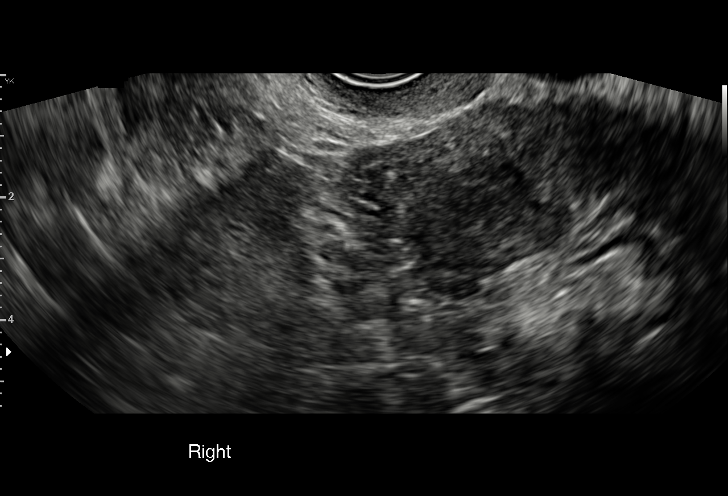
[im 52/57]
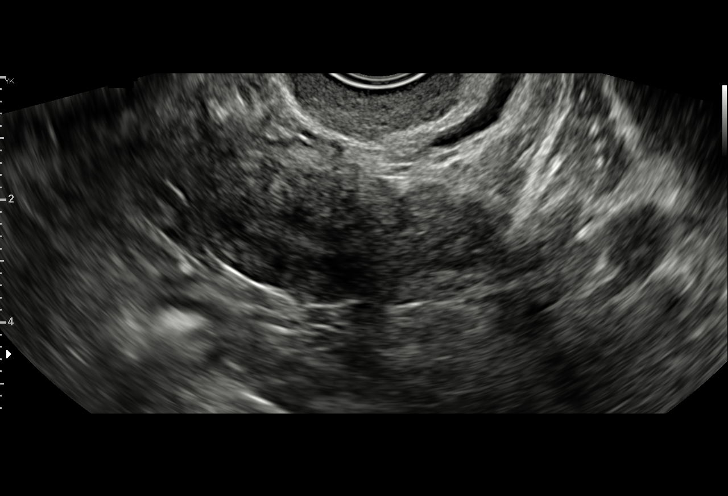
[im 57/57]
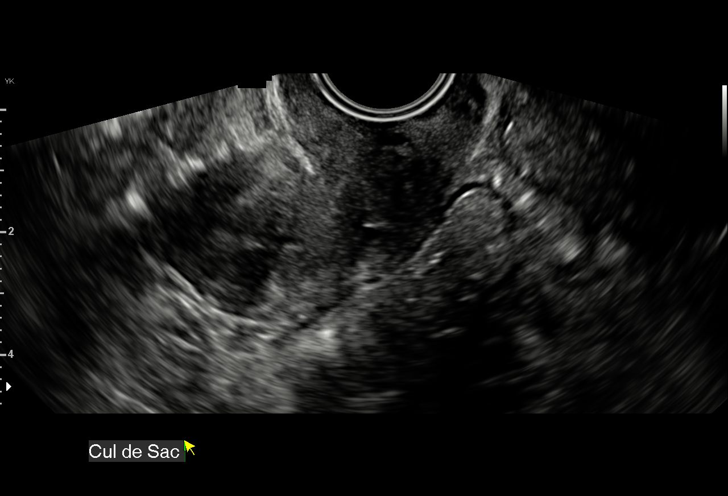

[15 of 25 positions shown; findings below may reference images not displayed]

FINDINGS: Uterus

Measurements: 5.5 x 2.7 x 3.4 cm = volume: 23 mL. No fibroids or
other mass visualized.

Endometrium

Thickness: 3 mm in thickness.  No focal abnormality visualized.

Right ovary

Measurements: 2.3 x 1.5 x 1.5 cm = volume: 2.7 mL. Normal
appearance/no adnexal mass.

Left ovary

Measurements: 2.8 x 1.3 x 1.8 cm = volume: 3.6 mL. Normal
appearance/no adnexal mass.

Other findings

No abnormal free fluid.
IMPRESSION: Normal pelvic ultrasound.

## 2019-05-30 ENCOUNTER — Ambulatory Visit: Payer: Medicare Other | Admitting: Family Medicine

## 2019-06-03 ENCOUNTER — Other Ambulatory Visit: Payer: Self-pay

## 2019-06-03 DIAGNOSIS — Z20828 Contact with and (suspected) exposure to other viral communicable diseases: Secondary | ICD-10-CM | POA: Diagnosis not present

## 2019-06-03 DIAGNOSIS — Z20822 Contact with and (suspected) exposure to covid-19: Secondary | ICD-10-CM

## 2019-06-04 LAB — NOVEL CORONAVIRUS, NAA: SARS-CoV-2, NAA: NOT DETECTED

## 2019-06-05 ENCOUNTER — Telehealth: Payer: Self-pay | Admitting: Family Medicine

## 2019-06-05 NOTE — Telephone Encounter (Signed)
Patient called back to say that she have already received her covid test result on MyChart

## 2019-06-06 ENCOUNTER — Ambulatory Visit: Payer: Medicare Other | Admitting: Family Medicine

## 2019-06-16 ENCOUNTER — Other Ambulatory Visit: Payer: Self-pay

## 2019-06-16 DIAGNOSIS — Z20822 Contact with and (suspected) exposure to covid-19: Secondary | ICD-10-CM

## 2019-06-16 DIAGNOSIS — Z20828 Contact with and (suspected) exposure to other viral communicable diseases: Secondary | ICD-10-CM | POA: Diagnosis not present

## 2019-06-17 LAB — NOVEL CORONAVIRUS, NAA: SARS-CoV-2, NAA: NOT DETECTED

## 2019-06-19 ENCOUNTER — Ambulatory Visit (INDEPENDENT_AMBULATORY_CARE_PROVIDER_SITE_OTHER): Payer: Medicare Other | Admitting: Family Medicine

## 2019-06-19 ENCOUNTER — Encounter: Payer: Self-pay | Admitting: Family Medicine

## 2019-06-19 ENCOUNTER — Other Ambulatory Visit: Payer: Self-pay

## 2019-06-19 VITALS — BP 105/65 | HR 86 | Temp 98.1°F | Ht 63.0 in | Wt 181.0 lb

## 2019-06-19 DIAGNOSIS — I10 Essential (primary) hypertension: Secondary | ICD-10-CM

## 2019-06-19 DIAGNOSIS — E1142 Type 2 diabetes mellitus with diabetic polyneuropathy: Secondary | ICD-10-CM | POA: Diagnosis not present

## 2019-06-19 DIAGNOSIS — N1831 Chronic kidney disease, stage 3a: Secondary | ICD-10-CM | POA: Diagnosis not present

## 2019-06-19 DIAGNOSIS — J0120 Acute ethmoidal sinusitis, unspecified: Secondary | ICD-10-CM | POA: Diagnosis not present

## 2019-06-19 DIAGNOSIS — E119 Type 2 diabetes mellitus without complications: Secondary | ICD-10-CM | POA: Diagnosis not present

## 2019-06-19 LAB — POCT GLYCOSYLATED HEMOGLOBIN (HGB A1C): Hemoglobin A1C: 6.6 % — AB (ref 4.0–5.6)

## 2019-06-19 MED ORDER — DULOXETINE HCL 30 MG PO CPEP: 30.0000 mg | ORAL_CAPSULE | Freq: Every day | ORAL | 2 refills | Status: DC

## 2019-06-19 MED ORDER — DOXYCYCLINE HYCLATE 100 MG PO TABS: 100.0000 mg | ORAL_TABLET | Freq: Two times a day (BID) | ORAL | 0 refills | Status: DC

## 2019-06-19 NOTE — Progress Notes (Signed)
12/3/202011:18 AM  Alison Friedman [birth year], 73 y.o., female [id number]  Chief Complaint  Patient presents with  . Follow-up    chronic conditions, having post nasal drip    HPI:   Patient is a 73 y.o. female with past medical history significant for DM2, HTN, IBS, osteoporosis, vitamin D deficiency, vaginal atrophy, HLP with statin intolerance, essential tremor who presents today for routine followup  Last OV Aug [id number] - started trulicity Saw cards sept 2020 - no changes  Checks cbgs fasting and after meals Having nausea from trulicity - tolerable Has been drinking more water Cutting back on carbs Having increasing burning and tingling in her feet, L > R, goes up her calves Intolerant of gabapentin and lyrica - too sedating Has never tried duloxetine Getting increasingly more frustrated with life in general to the point that she almost started smoking again   Having increased sinus pressure middle of her forehead, with increased postnasal drip Has been using flonase and antihistamine wo getting relief - this normally works for her She has also noted swollen cervical LAD She denies any fevers, ear pain or sore throat Tends to have sinus issues in the fall/winter She does ok with doxycycline  Lab Results  Component Value Date   HGBA1C 7.3 (A) 02/28/2019   HGBA1C 7.0 (H) 07/30/2018   HGBA1C 7.0 (A) 01/21/2018   Lab Results  Component Value Date   MICROALBUR <0.7 11/16/2017   LDLCALC 60 03/31/2019   CREATININE 1.16 (H) 02/28/2019   GFR 47  Depression screen [facility] 2/9 06/19/2019 02/28/2019 07/30/2018  Decreased Interest 0 0 0  Down, Depressed, Hopeless 0 1 0  PHQ - 2 Score 0 1 0  Altered sleeping - - -  Tired, decreased energy - - -  Change in appetite - - -  Feeling bad or failure about yourself  - - -  Trouble concentrating - - -  Moving slowly or fidgety/restless - - -  Suicidal thoughts - - -  PHQ-9 Score - - -  Difficult doing work/chores - - -    Fall  Risk  06/19/2019 02/28/2019 07/30/2018 06/17/2018 06/17/2018  Falls in the past year? 0 0 0 0 0  Number falls in past yr: 0 0 - - -  Injury with Fall? 0 0 - - -  Follow up - Falls evaluation completed - - -     Allergies  Allergen Reactions  . Nitrofurantoin Monohyd Macro Anaphylaxis  . Penicillins Anaphylaxis  . Shellfish Allergy Anaphylaxis  . Tuna [Fish Allergy] Anaphylaxis  . Clarithromycin Other (See Comments)    Face turns red.  . Crestor [Rosuvastatin]   . Food     tuna  . Iodinated Diagnostic Agents     Prev CT reports state anaphylaxis with IV contrast, pt also states severe reaction with contrast  . Levofloxacin   . Levofloxacin Other (See Comments)    Face turned red.  . Lipitor [Atorvastatin Calcium]   . Lipitor [Atorvastatin Calcium] Other (See Comments)    Muscle pain  . Macrolides And Ketolides   . Nitrofurantoin Monohyd Macro   . Penicillins   . Percocet [Oxycodone-Acetaminophen]   . Percocet [Oxycodone-Acetaminophen] Swelling  . Sulfa Antibiotics   . Sulfa Antibiotics Swelling  . Tuna [Fish Allergy] Hives    Prior to Admission medications   Medication Sig Start Date End Date Taking? Authorizing Provider  albuterol (PROVENTIL HFA;VENTOLIN HFA) 108 (90 Base) MCG/ACT inhaler INHALE 2 PUFFS BY MOUTH EVERY 4 HOURS AS  NEEDED FOR WHEEZING OR  SHORTNESS  OF  BREATH 10/01/18  Yes Rutherford Guys, MD  ALPRAZolam Duanne Moron) 0.25 MG tablet Take 0.5-1 tablets (0.125-0.25 mg total) by mouth daily as needed for anxiety. 10/22/17  Yes Tereasa Coop, PA-C  Cholecalciferol (VITAMIN D) 125 MCG (5000 UT) CAPS Take 5,000 Units by mouth daily.    Yes [provider]  conjugated estrogens (PREMARIN) vaginal cream Place 0.5 Applicatorfuls vaginally 2 (two) times a week. Patient taking differently: Place 0.5 Applicatorfuls vaginally every 30 (thirty) days.  06/17/18  Yes Rutherford Guys, MD  dicyclomine (BENTYL) 20 MG tablet TAKE ONE TABLET BY MOUTH EVERY 6 HOURS Patient taking  differently: as needed.  04/08/15  Yes [name], MD  Dulaglutide (TRULICITY) [id number] SOPN Inject 0.75 mg into the skin once a week. 02/28/19  Yes Rutherford Guys, MD  Evolocumab (REPATHA SURECLICK) [id number] MG/ML SOAJ Inject 140 mg into the skin every 14 (fourteen) days. 04/09/19  Yes Adrian Prows, MD  fluticasone [facility]) 50 MCG/ACT nasal spray USE TWO SPRAY(S) IN EACH NOSTRIL ONCE DAILY 02/28/19  Yes Rutherford Guys, MD  glucose blood (ONETOUCH VERIO) test strip Use as instructed to check blood sugar once a day at various times-Dx code E11.9 02/28/19  Yes Rutherford Guys, MD  metFORMIN (GLUCOPHAGE-XR) 500 MG 24 hr tablet Take 2 tablets by mouth twice daily 09/23/18  Yes Rutherford Guys, MD  pregabalin (LYRICA) 25 MG capsule Take by mouth daily. 03/14/19 03/13/20 Yes [provider]  risedronate (ACTONEL) 35 MG tablet TAKE 1 TABLET BY MOUTH ONCE A WEEK WITH WATER ON EMPTY STOMACH, NOTHING BY MOUTH OR LIE  DOWN  FOR NEXT 30 MINUTES 11/14/18  Yes Elayne Snare, MD  telmisartan (MICARDIS) 40 MG tablet Take 1 tablet (40 mg total) by mouth daily. 02/28/19 05/29/19  Rutherford Guys, MD    Past Medical History:  Diagnosis Date  . Abnormal CT scan, chest    stable  . Allergy   . Anxiety   . Asthma   . Asthma   . Cervical disc disease   . Diabetes mellitus   . Diabetes mellitus without complication ([place])   . Discoid lupus   . Gallbladder polyp   . Hyperlipidemia   . Hypertension   . Lymphadenopathy   . Postmenopausal HRT (hormone replacement therapy)   . Pulmonary embolism ([facility]) At age 59    With associated DVT  . Renal cyst   . Shingles 12/2010    Past Surgical History:  Procedure Laterality Date  . APPENDECTOMY     child  . APPENDECTOMY    . BREAST SURGERY    . LAPAROSCOPIC HELLER MYOTOMY    . NECK SURGERY     Disk and rod  . SPINE SURGERY  2004   c spine  . TONSILLECTOMY AND ADENOIDECTOMY    . TUBAL LIGATION      Social History   Tobacco Use  . Smoking status:  Former Smoker    Packs/day: 0.25    Years: 35.00    Pack years: 8.75    Types: Cigarettes    Quit date: 2018    Years since quitting: 2.9  . Smokeless tobacco: Former Systems developer  . Tobacco comment: Encouraged to remain smoke free.  Substance Use Topics  . Alcohol use: Not Currently    Alcohol/week: 0.0 standard drinks    Family History  Problem Relation Age of Onset  . Osteoporosis Mother   . Osteoporosis Sister   .  Breast cancer Daughter 79  . Dementia Maternal Grandmother   . Stroke Maternal Grandfather     Review of Systems  Constitutional: Negative for chills and fever.  Respiratory: Negative for cough and shortness of breath.   Cardiovascular: Negative for chest pain, palpitations and leg swelling.  Gastrointestinal: Positive for nausea. Negative for abdominal pain and vomiting.   Per hpi  OBJECTIVE:  Today's Vitals   06/19/19 1113  BP: 105/65  Pulse: 86  Temp: 98.1 F (36.7 C)  SpO2: 97%  Weight: 181 lb (82.1 kg)  Height: 5\' 3"  (1.6 m)   Body mass index is 32.06 kg/m.  Wt Readings from Last 3 Encounters:  06/19/19 181 lb (82.1 kg)  04/09/19 183 lb 14.4 oz (83.4 kg)  02/28/19 185 lb (83.9 kg)    Physical Exam Vitals signs and nursing note reviewed.  Constitutional:      Appearance: She is well-developed.  HENT:     Head: Normocephalic and atraumatic.     Mouth/Throat:     Pharynx: No oropharyngeal exudate.  Eyes:     General: No scleral icterus.    Conjunctiva/sclera: Conjunctivae normal.     Pupils: Pupils are equal, round, and reactive to light.  Neck:     Musculoskeletal: Neck supple.  Cardiovascular:     Rate and Rhythm: Normal rate and regular rhythm.     Heart sounds: Normal heart sounds. No murmur. No friction rub. No gallop.   Pulmonary:     Effort: Pulmonary effort is normal.     Breath sounds: Normal breath sounds. No wheezing or rales.  Skin:    General: Skin is warm and dry.  Neurological:     Mental Status: She is alert and  oriented to person, place, and time.     Results for orders placed or performed in visit on 06/19/19 (from the past 24 hour(s))  POCT glycosylated hemoglobin (Hb A1C)     Status: Abnormal   Collection Time: 06/19/19 11:40 AM  Result Value Ref Range   Hemoglobin A1C 6.6 (A) 4.0 - 5.6 %   HbA1c POC (<> result, manual entry)     HbA1c, POC (prediabetic range)     HbA1c, POC (controlled diabetic range)      No results found.   ASSESSMENT and PLAN  1. Type 2 diabetes mellitus with diabetic polyneuropathy, without long-term current use of insulin (HCC) Controlled. Nausea tolerable, discussed supportive measures. Cont with LFM. Trial of duloxetine, reviewed r/se/b - POCT glycosylated hemoglobin (Hb A1C) - Lipid panel - CMET with GFR  2. Essential hypertension Controlled. Continue current regime.  - Lipid panel - CMET with GFR  3. Stage 3a chronic kidney disease Cont to monitor, avoidance of nephrotoxins. Decrease metformin if gfr < 45  4. Acute non-recurrent ethmoidal sinusitis rx for doxycycline given. Cont supportive measures  Other orders - doxycycline (VIBRA-TABS) 100 MG tablet; Take 1 tablet (100 mg total) by mouth 2 (two) times daily. - DULoxetine (CYMBALTA) 30 MG capsule; Take 1 capsule (30 mg total) by mouth daily.  Return in about 3 months (around 09/17/2019).    Rutherford Guys, MD [facility] [place], [place] [address] Ph.  [phone] Fax [phone]

## 2019-06-19 NOTE — Patient Instructions (Signed)
° ° ° °  If you have lab work done today you will be contacted with your lab results within the next 2 weeks.  If you have not heard from us then please contact us. The fastest way to get your results is to register for My Chart. ° ° °IF you received an x-ray today, you will receive an invoice from [facility]. Please contact [facility] at [phone] with questions or concerns regarding your invoice.  ° °IF you received labwork today, you will receive an invoice from [facility]. Please contact [facility] at [phone] with questions or concerns regarding your invoice.  ° °Our billing staff will not be able to assist you with questions regarding bills from these companies. ° °You will be contacted with the lab results as soon as they are available. The fastest way to get your results is to activate your My Chart account. Instructions are located on the last page of this paperwork. If you have not heard from us regarding the results in 2 weeks, please contact this office. °  ° ° ° °

## 2019-06-20 LAB — CMP14+EGFR
ALT: 14 IU/L (ref 0–32)
AST: 13 IU/L (ref 0–40)
Albumin/Globulin Ratio: 1.8 (ref 1.2–2.2)
Albumin: 4.5 g/dL (ref 3.7–4.7)
Alkaline Phosphatase: 106 IU/L (ref 39–117)
BUN/Creatinine Ratio: 22 (ref 12–28)
BUN: 31 mg/dL — ABNORMAL HIGH (ref 8–27)
Bilirubin Total: 0.6 mg/dL (ref 0.0–1.2)
CO2: 17 mmol/L — ABNORMAL LOW (ref 20–29)
Calcium: 10 mg/dL (ref 8.7–10.3)
Chloride: 102 mmol/L (ref 96–106)
Creatinine, Ser: 1.43 mg/dL — ABNORMAL HIGH (ref 0.57–1.00)
GFR calc Af Amer: 42 mL/min/{1.73_m2} — ABNORMAL LOW (ref >59)
GFR calc non Af Amer: 36 mL/min/{1.73_m2} — ABNORMAL LOW (ref >59)
Globulin, Total: 2.5 g/dL (ref 1.5–4.5)
Glucose: 121 mg/dL — ABNORMAL HIGH (ref 65–99)
Potassium: 5.2 mmol/L (ref 3.5–5.2)
Sodium: 137 mmol/L (ref 134–144)
Total Protein: 7 g/dL (ref 6.0–8.5)

## 2019-06-20 LAB — LIPID PANEL
Chol/HDL Ratio: 5.3 ratio — ABNORMAL HIGH (ref 0.0–4.4)
Cholesterol, Total: 206 mg/dL — ABNORMAL HIGH (ref 100–199)
HDL: 39 mg/dL — ABNORMAL LOW (ref >39)
LDL Chol Calc (NIH): 113 mg/dL — ABNORMAL HIGH (ref 0–99)
Triglycerides: 312 mg/dL — ABNORMAL HIGH (ref 0–149)
VLDL Cholesterol Cal: 54 mg/dL — ABNORMAL HIGH (ref 5–40)

## 2019-06-24 NOTE — Addendum Note (Signed)
Addended by: Rutherford Guys on: 06/24/2019 02:41 PM   Modules accepted: Orders

## 2019-07-10 ENCOUNTER — Telehealth: Payer: Self-pay | Admitting: *Deleted

## 2019-07-10 ENCOUNTER — Ambulatory Visit (INDEPENDENT_AMBULATORY_CARE_PROVIDER_SITE_OTHER): Payer: Medicare Other | Admitting: Family Medicine

## 2019-07-10 ENCOUNTER — Ambulatory Visit: Payer: Self-pay

## 2019-07-10 VITALS — BP 105/65 | Ht 63.0 in | Wt 181.0 lb

## 2019-07-10 DIAGNOSIS — Z0000 Encounter for general adult medical examination without abnormal findings: Secondary | ICD-10-CM

## 2019-07-10 NOTE — Telephone Encounter (Signed)
Dr. Pamella Pert  Alison Friedman states that she has stopped taken her Trulicity  She stated it was making her legs swell.   She has a follow up scheduled 09-18-2018 was going to discuss it then.         Do you want to see her early, or send something else in.

## 2019-07-10 NOTE — Patient Instructions (Signed)
Thank you for taking time to come for your Medicare Wellness Visit. I appreciate your ongoing commitment to your health goals. Please review the following plan we discussed and let me know if I can assist you in the future.  Alison Dresner LPN  Preventive Care 73 Years and Older, Female Preventive care refers to lifestyle choices and visits with your health care provider that can promote health and wellness. This includes:  A yearly physical exam. This is also called an annual well check.  Regular dental and eye exams.  Immunizations.  Screening for certain conditions.  Healthy lifestyle choices, such as diet and exercise. What can I expect for my preventive care visit? Physical exam Your health care provider will check:  Height and weight. These may be used to calculate body mass index (BMI), which is a measurement that tells if you are at a healthy weight.  Heart rate and blood pressure.  Your skin for abnormal spots. Counseling Your health care provider may ask you questions about:  Alcohol, tobacco, and drug use.  Emotional well-being.  Home and relationship well-being.  Sexual activity.  Eating habits.  History of falls.  Memory and ability to understand (cognition).  Work and work environment.  Pregnancy and menstrual history. What immunizations do I need?  Influenza (flu) vaccine  This is recommended every year. Tetanus, diphtheria, and pertussis (Tdap) vaccine  You may need a Td booster every 10 years. Varicella (chickenpox) vaccine  You may need this vaccine if you have not already been vaccinated. Zoster (shingles) vaccine  You may need this after age 60. Pneumococcal conjugate (PCV13) vaccine  One dose is recommended after age 73. Pneumococcal polysaccharide (PPSV23) vaccine  One dose is recommended after age 73. Measles, mumps, and rubella (MMR) vaccine  You may need at least one dose of MMR if you were born in 1957 or later. You may also  need a second dose. Meningococcal conjugate (MenACWY) vaccine  You may need this if you have certain conditions. Hepatitis A vaccine  You may need this if you have certain conditions or if you travel or work in places where you may be exposed to hepatitis A. Hepatitis B vaccine  You may need this if you have certain conditions or if you travel or work in places where you may be exposed to hepatitis B. Haemophilus influenzae type b (Hib) vaccine  You may need this if you have certain conditions. You may receive vaccines as individual doses or as more than one vaccine together in one shot (combination vaccines). Talk with your health care provider about the risks and benefits of combination vaccines. What tests do I need? Blood tests  Lipid and cholesterol levels. These may be checked every 5 years, or more frequently depending on your overall health.  Hepatitis C test.  Hepatitis B test. Screening  Lung cancer screening. You may have this screening every year starting at age 55 if you have a 30-pack-year history of smoking and currently smoke or have quit within the past 15 years.  Colorectal cancer screening. All adults should have this screening starting at age 73 and continuing until age 75. Your health care provider may recommend screening at age 45 if you are at increased risk. You will have tests every 1-10 years, depending on your results and the type of screening test.  Diabetes screening. This is done by checking your blood sugar (glucose) after you have not eaten for a while (fasting). You may have this done every 1-3   years.  Mammogram. This may be done every 1-2 years. Talk with your health care provider about how often you should have regular mammograms.  BRCA-related cancer screening. This may be done if you have a family history of breast, ovarian, tubal, or peritoneal cancers. Other tests  Sexually transmitted disease (STD) testing.  Bone density scan. This is done  to screen for osteoporosis. You may have this done starting at age 73. Follow these instructions at home: Eating and drinking  Eat a diet that includes fresh fruits and vegetables, whole grains, lean protein, and low-fat dairy products. Limit your intake of foods with high amounts of sugar, saturated fats, and salt.  Take vitamin and mineral supplements as recommended by your health care provider.  Do not drink alcohol if your health care provider tells you not to drink.  If you drink alcohol: ? Limit how much you have to 0-1 drink a day. ? Be aware of how much alcohol is in your drink. In the [place] one drink equals one 12 oz bottle of beer (355 mL), one 5 oz glass of wine (148 mL), or one 1 oz glass of hard liquor (44 mL). Lifestyle  Take daily care of your teeth and gums.  Stay active. Exercise for at least 30 minutes on 5 or more days each week.  Do not use any products that contain nicotine or tobacco, such as cigarettes, e-cigarettes, and chewing tobacco. If you need help quitting, ask your health care provider.  If you are sexually active, practice safe sex. Use a condom or other form of protection in order to prevent STIs (sexually transmitted infections).  Talk with your health care provider about taking a low-dose aspirin or statin. What's next?  Go to your health care provider once a year for a well check visit.  Ask your health care provider how often you should have your eyes and teeth checked.  Stay up to date on all vaccines. This information is not intended to replace advice given to you by your health care provider. Make sure you discuss any questions you have with your health care provider. Document Released: 07/30/2015 Document Revised: 06/27/2018 Document Reviewed: 06/27/2018 Elsevier Patient Education  2020 [employer].

## 2019-07-10 NOTE — Progress Notes (Signed)
Presents today for [employer] Visit   Date of last exam: 06-19-2019  Interpreter used for this visit?   No  I connected with  Alison Friedman on 07/10/19 by a telephone and verified that I am speaking with the correct person using two identifiers.   I discussed the limitations of evaluation and management by telemedicine. The patient expressed understanding and agreed to proceed.   Patient Care Team: Rutherford Guys, MD as PCP - General (Family Medicine) Frederico Hamman, MD (Inactive) (Obstetrics and Gynecology) Willey Blade, MD (General Surgery) Breck Coons, MD (Internal Medicine) Adrian Prows, MD as Consulting Physician (Cardiology) Elayne Snare, MD as Consulting Physician (Endocrinology)   Other items to address today:   Discussed immunizations  Discussed Eye/Dental Patient has a follow up scheduled 09-18-2018     Other Screening: Last screening for diabetes: 06/19/2019 Last lipid screening: 06/19/2019  ADVANCE DIRECTIVES: Discussed:yes On File: no Materials Provided: no copy requested  Immunization status:   There is no immunization history on file for this patient.   There are no preventive care reminders to display for this patient.   Functional Status Survey: Is the patient deaf or have difficulty hearing?: No Does the patient have difficulty seeing, even when wearing glasses/contacts?: No Does the patient have difficulty concentrating, remembering, or making decisions?: No Does the patient have difficulty walking or climbing stairs?: Yes(knee pain) Does the patient have difficulty dressing or bathing?: No Does the patient have difficulty doing errands alone such as visiting a doctor's office or shopping?: No   6CIT Screen 07/10/2019  What Year? 0 points  What month? 0 points  What time? 0 points  Count back from 20 0 points  Months in reverse 0 points  Repeat phrase 0 points  Total Score 0        Clinical Support  from 07/10/2019 in [facility]  AUDIT-C Score  2       Home Environment:   Lives in two story  Has trouble climbing stairs  Knee pain No scattered rugs No grabs bars Adequate lighting/no clutter  Timed warm up N/A   Patient Active Problem List   Diagnosis Date Noted  . Stage 3a chronic kidney disease 06/19/2019  . Statin intolerance 07/30/2018  . Right lower quadrant abdominal pain 08/01/2017  . Esophageal stricture 05/17/2017  . Gastroesophageal reflux disease without esophagitis 05/17/2017  . Hx of adenomatous colonic polyps 05/17/2017  . Irritable bowel syndrome with constipation 05/17/2017  . Hypertension 09/26/2016  . Class 1 obesity due to excess calories with serious comorbidity and body mass index (BMI) of 32.0 to 32.9 in adult 09/26/2016  . 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 Generalized anxiety disorder 05/29/2016  . Osteoporosis 10/14/2015  . Coronary artery calcification 07/01/2015  . Smoker 07/01/2015  . COPD, moderate ([place]) 07/01/2015  . Enlargement of lymph nodes 01/01/2014  . Cervical disc disease 05/21/2012  . Recurrent sinusitis 05/21/2012  . Diabetes mellitus ([place]) 10/10/2011  . Allergic rhinitis 10/10/2011  . RAD (reactive airway disease) 10/10/2011  . Diabetes mellitus 08/22/2011  . Asthma 08/22/2011  . Anxiety   . Hyperlipidemia   . Hypertension      Past Medical History:  Diagnosis Date  . Abnormal CT scan, chest    stable  . Allergy   . Anxiety   . Asthma   . Asthma   . Cervical disc disease   . Diabetes mellitus   . Diabetes  mellitus without complication ([place])   . Discoid lupus   . Gallbladder polyp   . Hyperlipidemia   . Hypertension   . Lymphadenopathy   . Postmenopausal HRT (hormone replacement therapy)   . Pulmonary embolism ([place]) At age 31    With associated DVT  . Renal cyst   . Shingles 12/2010     Past Surgical History:  Procedure Laterality Date  . APPENDECTOMY     child  . APPENDECTOMY     . BREAST SURGERY    . LAPAROSCOPIC HELLER MYOTOMY    . NECK SURGERY     Disk and rod  . SPINE SURGERY  2004   c spine  . TONSILLECTOMY AND ADENOIDECTOMY    . TUBAL LIGATION       Family History  Problem Relation Age of Onset  . Osteoporosis Mother   . Osteoporosis Sister   . Breast cancer Daughter 19  . Dementia Maternal Grandmother   . Stroke Maternal Grandfather      Social History   Socioeconomic History  . Marital status: Married    Spouse name: Not on file  . Number of children: 2  . Years of education: 9  . Highest education level: Not on file  Occupational History  . Occupation: caretaker  . Occupation: Retired  Tobacco Use  . Smoking status: Former Smoker    Packs/day: 0.25    Years: 35.00    Pack years: 8.75    Types: Cigarettes    Quit date: 2018    Years since quitting: 2.9  . Smokeless tobacco: Former Systems developer  . Tobacco comment: Encouraged to remain smoke free.  Substance and Sexual Activity  . Alcohol use: Not Currently    Alcohol/week: 0.0 standard drinks  . Drug use: No  . Sexual activity: Yes    Birth control/protection: None  Other Topics Concern  . 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  Denies abuse and feels safe at home.        ** Data from: 08/22/11 Enc Dept: [facility]   Exercise--walking daily 20-30 min.   Social Determinants of Health   Financial Resource Strain:   . Difficulty of Paying Living Expenses: Not on file  Food Insecurity:   . Worried About Charity fundraiser in the Last Year: Not on file  . Ran Out of Food in the Last Year: Not on file  Transportation Needs:   . Lack of Transportation (Medical): Not on file  . Lack of Transportation (Non-Medical): Not on file  Physical Activity:   . Days of Exercise per Week: Not on file  . Minutes of Exercise per Session: Not on file  Stress:   .  Feeling of Stress : Not on file  Social Connections:   . Frequency of Communication with Friends and Family: Not on file  . Frequency of Social Gatherings with Friends and Family: Not on file  . Attends Religious Services: Not on file  . Active Member of Clubs or Organizations: Not on file  . Attends Archivist Meetings: Not on file  . Marital Status: Not on file  Intimate Partner Violence:   . Fear of Current or Ex-Partner: Not on file  . Emotionally Abused: Not on file  . Physically Abused: Not on file  . Sexually Abused: Not  on file     Allergies  Allergen Reactions  . Nitrofurantoin Monohyd Macro Anaphylaxis  . Penicillins Anaphylaxis  . Shellfish Allergy Anaphylaxis  . Tuna [Fish Allergy] Anaphylaxis  . Clarithromycin Other (See Comments)    Face turns red.  . Crestor [Rosuvastatin]   . Food     tuna  . Iodinated Diagnostic Agents     Prev CT reports state anaphylaxis with IV contrast, pt also states severe reaction with contrast  . Levofloxacin   . Levofloxacin Other (See Comments)    Face turned red.  . Lipitor [Atorvastatin Calcium]   . Lipitor [Atorvastatin Calcium] Other (See Comments)    Muscle pain  . Macrolides And Ketolides   . Nitrofurantoin Monohyd Macro   . Penicillins   . Percocet [Oxycodone-Acetaminophen]   . Percocet [Oxycodone-Acetaminophen] Swelling  . Sulfa Antibiotics   . Sulfa Antibiotics Swelling  . Tuna [Fish Allergy] Hives     Prior to Admission medications   Medication Sig Start Date End Date Taking? Authorizing Provider  albuterol (PROVENTIL HFA;VENTOLIN HFA) 108 (90 Base) MCG/ACT inhaler INHALE 2 PUFFS BY MOUTH EVERY 4 HOURS AS NEEDED FOR WHEEZING OR  SHORTNESS  OF  BREATH 10/01/18  Yes Rutherford Guys, MD  ALPRAZolam Duanne Moron) 0.25 MG tablet Take 0.5-1 tablets (0.125-0.25 mg total) by mouth daily as needed for anxiety. 10/22/17  Yes Tereasa Coop, PA-C  Cholecalciferol (VITAMIN D) 125 MCG (5000 UT) CAPS Take 5,000 Units by  mouth daily.    Yes [provider]  conjugated estrogens (PREMARIN) vaginal cream Place 0.5 Applicatorfuls vaginally 2 (two) times a week. Patient taking differently: Place 0.5 Applicatorfuls vaginally every 30 (thirty) days.  06/17/18  Yes Rutherford Guys, MD  dicyclomine (BENTYL) 20 MG tablet TAKE ONE TABLET BY MOUTH EVERY 6 HOURS Patient taking differently: as needed.  04/08/15  Yes [name], MD  DULoxetine (CYMBALTA) 30 MG capsule Take 1 capsule (30 mg total) by mouth daily. 06/19/19  Yes Rutherford Guys, MD  Evolocumab (REPATHA SURECLICK) [id number] MG/ML SOAJ Inject 140 mg into the skin every 14 (fourteen) days. 04/09/19  Yes Adrian Prows, MD  fluticasone [facility]) 50 MCG/ACT nasal spray USE TWO SPRAY(S) IN EACH NOSTRIL ONCE DAILY 02/28/19  Yes Rutherford Guys, MD  glucose blood (ONETOUCH VERIO) test strip Use as instructed to check blood sugar once a day at various times-Dx code E11.9 02/28/19  Yes Rutherford Guys, MD  metFORMIN (GLUCOPHAGE-XR) 500 MG 24 hr tablet Take 2 tablets by mouth twice daily 09/23/18  Yes Rutherford Guys, MD  pregabalin (LYRICA) 25 MG capsule Take by mouth daily. 03/14/19 03/13/20 Yes [provider]  risedronate (ACTONEL) 35 MG tablet TAKE 1 TABLET BY MOUTH ONCE A WEEK WITH WATER ON EMPTY STOMACH, NOTHING BY MOUTH OR LIE  DOWN  FOR NEXT 30 MINUTES 11/14/18  Yes Elayne Snare, MD  telmisartan (MICARDIS) 40 MG tablet Take 1 tablet (40 mg total) by mouth daily. 02/28/19 07/10/19 Yes Rutherford Guys, MD  doxycycline (VIBRA-TABS) 100 MG tablet Take 1 tablet (100 mg total) by mouth 2 (two) times daily. 06/19/19   Rutherford Guys, MD  Dulaglutide (TRULICITY) [id number] SOPN Inject 0.75 mg into the skin once a week. Patient not taking: Reported on 07/10/2019 02/28/19   Rutherford Guys, MD     Depression screen [facility] 2/9 07/10/2019 06/19/2019 02/28/2019 07/30/2018 06/17/2018  Decreased Interest 0 0 0 0 0  Down, Depressed, Hopeless 0 0 1 0 0  PHQ -  2 Score 0 0 1 0 0   Altered sleeping - - - - -  Tired, decreased energy - - - - -  Change in appetite - - - - -  Feeling bad or failure about yourself  - - - - -  Trouble concentrating - - - - -  Moving slowly or fidgety/restless - - - - -  Suicidal thoughts - - - - -  PHQ-9 Score - - - - -  Difficult doing work/chores - - - - -     Fall Risk  07/10/2019 06/19/2019 02/28/2019 07/30/2018 06/17/2018  Falls in the past year? 0 0 0 0 0  Number falls in past yr: 0 0 0 - -  Injury with Fall? 0 0 0 - -  Follow up Falls evaluation completed;Education provided - Falls evaluation completed - -      PHYSICAL EXAM: BP 105/65 Comment: taken from previous visit  Ht 5\' 3"  (1.6 m)   Wt 181 lb (82.1 kg)   BMI 32.06 kg/m    Wt Readings from Last 3 Encounters:  07/10/19 181 lb (82.1 kg)  06/19/19 181 lb (82.1 kg)  04/09/19 183 lb 14.4 oz (83.4 kg)    Medicare annual wellness visit, subsequent     Education/Counseling provided regarding diet and exercise, prevention of chronic diseases, smoking/tobacco cessation, if applicable, and reviewed "Covered Medicare Preventive Services."

## 2019-07-10 NOTE — Telephone Encounter (Signed)
Please let Alison Friedman know that trulicity is not known to cause edema. I would like for her to cut back her salt intake and re-try trulicity again. thanks

## 2019-07-14 ENCOUNTER — Other Ambulatory Visit: Payer: Self-pay

## 2019-07-14 ENCOUNTER — Ambulatory Visit: Payer: Self-pay | Admitting: Family Medicine

## 2019-07-14 ENCOUNTER — Telehealth (INDEPENDENT_AMBULATORY_CARE_PROVIDER_SITE_OTHER): Payer: Medicare Other | Admitting: Family Medicine

## 2019-07-14 DIAGNOSIS — R197 Diarrhea, unspecified: Secondary | ICD-10-CM

## 2019-07-14 NOTE — Progress Notes (Signed)
CC- Nausea, vomiting, abd tenderness in the middle under ribs off/on x1 week. Some back pain in the right mi d back. Had bloody mucus in diarrhea on sat. But do not have any at this time.

## 2019-07-14 NOTE — Progress Notes (Signed)
Virtual Visit Note  I connected with patient on 07/14/19 at 1042am by phone and verified that I am speaking with the correct person using two identifiers. Alison Friedman is currently located at home and patient is currently with them during visit. The provider, Rutherford Guys, MD is located in their office at time of visit.  I discussed the limitations, risks, security and privacy concerns of performing an evaluation and management service by telephone and the availability of in person appointments. I also discussed with the patient that there may be a patient responsible charge related to this service. The patient expressed understanding and agreed to proceed.   CC: bloody diarrhea  HPI ? Has appt with GI later this afternoon Was given doxycycline for sinus infection about 5 days Wondering if trulicity related Had episode of sweaty, dizziness, nausea, vomiting, diarrhea, bloody mucous, having right sided abd pain, "feels like she has a fist in between her right ribs and hips" Diagnosed 5 years ago with UC, similar presentation, has not had any issues since then Today she is feeling better  Allergies  Allergen Reactions  . Nitrofurantoin Monohyd Macro Anaphylaxis  . Penicillins Anaphylaxis  . Shellfish Allergy Anaphylaxis  . Tuna [Fish Allergy] Anaphylaxis  . Clarithromycin Other (See Comments)    Face turns red.  . Crestor [Rosuvastatin]   . Food     tuna  . Iodinated Diagnostic Agents     Prev CT reports state anaphylaxis with IV contrast, pt also states severe reaction with contrast  . Levofloxacin   . Levofloxacin Other (See Comments)    Face turned red.  . Lipitor [Atorvastatin Calcium]   . Lipitor [Atorvastatin Calcium] Other (See Comments)    Muscle pain  . Macrolides And Ketolides   . Nitrofurantoin Monohyd Macro   . Penicillins   . Percocet [Oxycodone-Acetaminophen]   . Percocet [Oxycodone-Acetaminophen] Swelling  . Sulfa Antibiotics   . Sulfa Antibiotics  Swelling  . Tuna [Fish Allergy] Hives    Prior to Admission medications   Medication Sig Start Date End Date Taking? Authorizing Provider  albuterol (PROVENTIL HFA;VENTOLIN HFA) 108 (90 Base) MCG/ACT inhaler INHALE 2 PUFFS BY MOUTH EVERY 4 HOURS AS NEEDED FOR WHEEZING OR  SHORTNESS  OF  BREATH 10/01/18   Rutherford Guys, MD  ALPRAZolam Duanne Moron) 0.25 MG tablet Take 0.5-1 tablets (0.125-0.25 mg total) by mouth daily as needed for anxiety. 10/22/17   Tereasa Coop, PA-C  Cholecalciferol (VITAMIN D) 125 MCG (5000 UT) CAPS Take 5,000 Units by mouth daily.     [provider]  conjugated estrogens (PREMARIN) vaginal cream Place 0.5 Applicatorfuls vaginally 2 (two) times a week. Patient taking differently: Place 0.5 Applicatorfuls vaginally every 30 (thirty) days.  06/17/18   Rutherford Guys, MD  dicyclomine (BENTYL) 20 MG tablet TAKE ONE TABLET BY MOUTH EVERY 6 HOURS Patient taking differently: as needed.  04/08/15   [name], MD  Dulaglutide (TRULICITY) [id number] SOPN Inject 0.75 mg into the skin once a week. Patient not taking: Reported on 07/10/2019 02/28/19   Rutherford Guys, MD  DULoxetine (CYMBALTA) 30 MG capsule Take 1 capsule (30 mg total) by mouth daily. 06/19/19   Rutherford Guys, MD  Evolocumab (REPATHA SURECLICK) [id number] MG/ML SOAJ Inject 140 mg into the skin every 14 (fourteen) days. 04/09/19   Adrian Prows, MD  fluticasone [facility]) 50 MCG/ACT nasal spray USE TWO SPRAY(S) IN EACH NOSTRIL ONCE DAILY 02/28/19   Rutherford Guys, MD  glucose  blood (ONETOUCH VERIO) test strip Use as instructed to check blood sugar once a day at various times-Dx code E11.9 02/28/19   Rutherford Guys, MD  metFORMIN (GLUCOPHAGE-XR) 500 MG 24 hr tablet Take 2 tablets by mouth twice daily 09/23/18   Rutherford Guys, MD  pregabalin (LYRICA) 25 MG capsule Take by mouth daily. 03/14/19 03/13/20  [provider]  risedronate (ACTONEL) 35 MG tablet TAKE 1 TABLET BY MOUTH ONCE A WEEK WITH WATER ON EMPTY  STOMACH, NOTHING BY MOUTH OR LIE  DOWN  FOR NEXT 30 MINUTES 11/14/18   Elayne Snare, MD  telmisartan (MICARDIS) 40 MG tablet Take 1 tablet (40 mg total) by mouth daily. 02/28/19 07/10/19  Rutherford Guys, MD    Past Medical History:  Diagnosis Date  . Abnormal CT scan, chest    stable  . Allergy   . Anxiety   . Asthma   . Asthma   . Cervical disc disease   . Diabetes mellitus   . Diabetes mellitus without complication ([place])   . Discoid lupus   . Gallbladder polyp   . Hyperlipidemia   . Hypertension   . Lymphadenopathy   . Postmenopausal HRT (hormone replacement therapy)   . Pulmonary embolism ([place]) At age 86    With associated DVT  . Renal cyst   . Shingles 12/2010    Past Surgical History:  Procedure Laterality Date  . APPENDECTOMY     child  . APPENDECTOMY    . BREAST SURGERY    . LAPAROSCOPIC HELLER MYOTOMY    . NECK SURGERY     Disk and rod  . SPINE SURGERY  2004   c spine  . TONSILLECTOMY AND ADENOIDECTOMY    . TUBAL LIGATION      Social History   Tobacco Use  . Smoking status: Former Smoker    Packs/day: 0.25    Years: 35.00    Pack years: 8.75    Types: Cigarettes    Quit date: 2018    Years since quitting: 2.9  . Smokeless tobacco: Former Systems developer  . Tobacco comment: Encouraged to remain smoke free.  Substance Use Topics  . Alcohol use: Not Currently    Alcohol/week: 0.0 standard drinks    Family History  Problem Relation Age of Onset  . Osteoporosis Mother   . Osteoporosis Sister   . Breast cancer Daughter 48  . Dementia Maternal Grandmother   . Stroke Maternal Grandfather     ROS Per hpi  Objective  Vitals as reported by the patient: none   ASSESSMENT and PLAN  1. Bloody diarrhea Has upcoming appt with GI later today. Defer to them, if UC, would need to consider stopping trulicity  FOLLOW-UP: as scheduled   The above assessment and management plan was discussed with the patient. The patient verbalized understanding of and has  agreed to the management plan. Patient is aware to call the clinic if symptoms persist or worsen. Patient is aware when to return to the clinic for a follow-up visit. Patient educated on when it is appropriate to go to the emergency department.    I provided 9 minutes of non-face-to-face time during this encounter.  Rutherford Guys, MD Primary Care at [place], [place] [address] Ph.  [phone] Fax [phone]

## 2019-07-14 NOTE — Telephone Encounter (Signed)
Pt reports nausea and severe diarrhea, onset 07/10/2019. Noted bright red blood in stools and mucous "Mostly mucous." States "About 1/4c of blood but hard to tell." States "Cannot eat a thing, goes right through me." Also reports pain at right lower abdomen, radiates to back "A little." Area is tender to touch. Denies fever. Reports H/O IBS and ulcerative  colitis. Pt sounds distressed during call. Advised ED, declined. 'I won't go to any hospital." Reiterated need for ED eval, declines. Pt has GI MD, advised notifying as well.  Call transferred to practice.  Reason for Disposition . Patient sounds very sick or weak to the triager  Answer Assessment - Initial Assessment Questions 1. APPEARANCE of BLOOD: "What color is it?" "Is it passed separately, on the surface of the stool, or mixed in with the stool?"      Bright red, with mucous 2. AMOUNT: "How much blood was passed?"     Hard to tell, mostly mucous 3. FREQUENCY: "How many times has blood been passed with the stools?"      Every time I eat 4. ONSET: "When was the blood first seen in the stools?" (Days or weeks)      12/24 5. DIARRHEA: "Is there also some diarrhea?" If so, ask: "How many diarrhea stools were passed in past 24 hours?"      Yes 6. CONSTIPATION: "Do you have constipation?" If so, "How bad is it?"    No 7. RECURRENT SYMPTOMS: "Have you had blood in your stools before?" If so, ask: "When was the last time?" and "What happened that time?"      no 8. BLOOD THINNERS: "Do you take any blood thinners?" (e.g., Coumadin/warfarin, Pradaxa/dabigatran, aspirin)     no 9. OTHER SYMPTOMS: "Do you have any other symptoms?"  (e.g., abdominal pain, vomiting, dizziness, fever)     1 episode of emesis, multiple diarrheal stools, right sided lower abdominal pain, tender to touch, size of hand, "Little in back."  Protocols used: RECTAL BLEEDING-A-AH

## 2019-07-14 NOTE — Telephone Encounter (Signed)
Spoke with pt with your feed back and she stated that she don't know whether she will do it or not but wanted to thank me for letting her know.

## 2019-07-14 NOTE — Telephone Encounter (Signed)
Please Advise

## 2019-07-17 ENCOUNTER — Ambulatory Visit
Admission: RE | Admit: 2019-07-17 | Discharge: 2019-07-17 | Disposition: A | Discharge status: Home / Self Care | Payer: Medicare Other | Source: Ambulatory Visit | Attending: Acute Care | Admitting: Acute Care

## 2019-07-17 ENCOUNTER — Other Ambulatory Visit: Payer: Self-pay

## 2019-07-17 DIAGNOSIS — Z87891 Personal history of nicotine dependence: Secondary | ICD-10-CM

## 2019-07-17 DIAGNOSIS — Z122 Encounter for screening for malignant neoplasm of respiratory organs: Secondary | ICD-10-CM

## 2019-07-17 DIAGNOSIS — F17210 Nicotine dependence, cigarettes, uncomplicated: Secondary | ICD-10-CM

## 2019-07-21 ENCOUNTER — Telehealth: Payer: Self-pay

## 2019-07-21 ENCOUNTER — Other Ambulatory Visit: Payer: Self-pay | Admitting: Family Medicine

## 2019-07-21 DIAGNOSIS — N289 Disorder of kidney and ureter, unspecified: Secondary | ICD-10-CM

## 2019-07-21 DIAGNOSIS — R93429 Abnormal radiologic findings on diagnostic imaging of unspecified kidney: Secondary | ICD-10-CM

## 2019-07-21 NOTE — Telephone Encounter (Signed)
I have called the pt per provider request. I have informed pt that she has a mass in Kidney and pt stated that she was aware of it. That mass has been there for a few years. Pt is scheuled to have Endoscopy and Colonoscopy on tomorrow and the results will be sent to our office. Pt has a GI provider. I have informed pt that a Renal US has been ordered. She stated understanding and will look for the call.   Provider has been informed of message.

## 2019-07-22 DIAGNOSIS — Z86010 Personal history of colonic polyps: Secondary | ICD-10-CM | POA: Diagnosis not present

## 2019-07-22 DIAGNOSIS — K449 Diaphragmatic hernia without obstruction or gangrene: Secondary | ICD-10-CM | POA: Diagnosis not present

## 2019-07-22 DIAGNOSIS — K640 First degree hemorrhoids: Secondary | ICD-10-CM | POA: Diagnosis not present

## 2019-07-22 DIAGNOSIS — R935 Abnormal findings on diagnostic imaging of other abdominal regions, including retroperitoneum: Secondary | ICD-10-CM | POA: Diagnosis not present

## 2019-07-22 LAB — HM COLONOSCOPY

## 2019-07-24 ENCOUNTER — Other Ambulatory Visit: Payer: Self-pay | Admitting: *Deleted

## 2019-07-24 DIAGNOSIS — Z87891 Personal history of nicotine dependence: Secondary | ICD-10-CM

## 2019-07-29 ENCOUNTER — Ambulatory Visit
Admission: RE | Admit: 2019-07-29 | Discharge: 2019-07-29 | Disposition: A | Discharge status: Home / Self Care | Payer: Medicare Other | Source: Ambulatory Visit | Attending: Family Medicine | Admitting: Family Medicine

## 2019-07-29 DIAGNOSIS — N2889 Other specified disorders of kidney and ureter: Secondary | ICD-10-CM | POA: Diagnosis not present

## 2019-07-31 ENCOUNTER — Ambulatory Visit: Payer: Self-pay | Admitting: *Deleted

## 2019-07-31 NOTE — Telephone Encounter (Signed)
Answered Covid 19 related questions.  Reason for Disposition . Health Information question, no triage required and triager able to answer question  Answer Assessment - Initial Assessment Questions 1. REASON FOR CALL or QUESTION: "What is your reason for calling today?" or "How can I best help you?" or "What question do you have that I can help answer?"     Quarantine and testing questions after exposure  Protocols used: [place]

## 2019-08-01 ENCOUNTER — Ambulatory Visit: Payer: Medicare Other | Attending: Internal Medicine

## 2019-08-01 DIAGNOSIS — Z20822 Contact with and (suspected) exposure to covid-19: Secondary | ICD-10-CM | POA: Diagnosis not present

## 2019-08-02 LAB — NOVEL CORONAVIRUS, NAA: SARS-CoV-2, NAA: NOT DETECTED

## 2019-08-15 ENCOUNTER — Ambulatory Visit: Payer: Medicare Other

## 2019-08-23 ENCOUNTER — Ambulatory Visit: Payer: Medicare Other

## 2019-08-28 ENCOUNTER — Ambulatory Visit: Payer: Medicare Other

## 2019-09-18 ENCOUNTER — Encounter: Payer: Self-pay | Admitting: Family Medicine

## 2019-09-18 ENCOUNTER — Ambulatory Visit (INDEPENDENT_AMBULATORY_CARE_PROVIDER_SITE_OTHER): Payer: Medicare Other | Admitting: Family Medicine

## 2019-09-18 ENCOUNTER — Other Ambulatory Visit: Payer: Self-pay

## 2019-09-18 VITALS — BP 129/81 | HR 71 | Temp 98.2°F | Ht 63.0 in | Wt 180.6 lb

## 2019-09-18 DIAGNOSIS — I10 Essential (primary) hypertension: Secondary | ICD-10-CM | POA: Diagnosis not present

## 2019-09-18 DIAGNOSIS — E1142 Type 2 diabetes mellitus with diabetic polyneuropathy: Secondary | ICD-10-CM

## 2019-09-18 DIAGNOSIS — N1831 Chronic kidney disease, stage 3a: Secondary | ICD-10-CM | POA: Diagnosis not present

## 2019-09-18 DIAGNOSIS — Z8659 Personal history of other mental and behavioral disorders: Secondary | ICD-10-CM | POA: Diagnosis not present

## 2019-09-18 DIAGNOSIS — F4321 Adjustment disorder with depressed mood: Secondary | ICD-10-CM | POA: Diagnosis not present

## 2019-09-18 MED ORDER — ALPRAZOLAM 0.25 MG PO TABS: 0.1250 mg | ORAL_TABLET | Freq: Every day | ORAL | 1 refills | Status: DC | PRN

## 2019-09-18 NOTE — Progress Notes (Signed)
3/4/20211:42 PM  Alison Friedman April 08, 1946, 74 y.o., female [id number]  Chief Complaint  Patient presents with  . Diabetes    HPI:   Patient is a 74 y.o. female with past medical history significant for DM2, HTN, IBS, osteoporosis, vitamin D deficiency, vaginal atrophy, HLP with statin intolerance, essential tremorwho presents today forroutine followup  Last OV dec 2020 no changes except for decrease in renal function  unremarkable renal US jan 2021  She would like to have A1c checked as she stopped her trulicity as it was causing ankle swelling  She is depressed, really missing seeing her loved ones and friends She has not started cymbalta as she is worried about weight gain She reports that wellbutrin caused her essential tremor to get worse Requesting refill of xanax, last rx 2019 Limited exercise due to OA knees  Has completed  covid vaccines  Wt Readings from Last 3 Encounters:  09/18/19 180 lb 9.6 oz (81.9 kg)  07/17/19 181 lb (82.1 kg)  07/10/19 181 lb (82.1 kg)    Lab Results  Component Value Date   CREATININE 1.43 (H) 06/19/2019   CREATININE 1.16 (H) 02/28/2019   CREATININE 1.20 (H) 07/30/2018  GFR 36  Lab Results  Component Value Date   HGBA1C 6.6 (A) 06/19/2019   HGBA1C 7.3 (A) 02/28/2019   HGBA1C 7.0 (H) 07/30/2018   Lab Results  Component Value Date   MICROALBUR <0.7 11/16/2017   LDLCALC 113 (H) 06/19/2019   CREATININE 1.43 (H) 06/19/2019    Depression screen PHQ 2/9 07/14/2019 07/10/2019 06/19/2019  Decreased Interest 0 0 0  Down, Depressed, Hopeless 0 0 0  PHQ - 2 Score 0 0 0  Altered sleeping - - -  Tired, decreased energy - - -  Change in appetite - - -  Feeling bad or failure about yourself  - - -  Trouble concentrating - - -  Moving slowly or fidgety/restless - - -  Suicidal thoughts - - -  PHQ-9 Score - - -  Difficult doing work/chores - - -    Fall Risk  09/18/2019 07/14/2019 07/10/2019 06/19/2019 02/28/2019  Falls in the past  year? 0 0 0 0 0  Number falls in past yr: 0 0 0 0 0  Injury with Fall? 0 0 0 0 0  Follow up - Falls evaluation completed Falls evaluation completed;Education provided - Falls evaluation completed     Allergies  Allergen Reactions  . Nitrofurantoin Monohyd Macro Anaphylaxis  . Penicillins Anaphylaxis  . Shellfish Allergy Anaphylaxis  . Tuna [Fish Allergy] Anaphylaxis  . Clarithromycin Other (See Comments)    Face turns red.  . Crestor [Rosuvastatin]   . Food     tuna  . Iodinated Diagnostic Agents     Prev CT reports state anaphylaxis with IV contrast, pt also states severe reaction with contrast  . Levofloxacin   . Levofloxacin Other (See Comments)    Face turned red.  . Lipitor [Atorvastatin Calcium]   . Lipitor [Atorvastatin Calcium] Other (See Comments)    Muscle pain  . Macrolides And Ketolides   . Nitrofurantoin Monohyd Macro   . Penicillins   . Percocet [Oxycodone-Acetaminophen]   . Percocet [Oxycodone-Acetaminophen] Swelling  . Sulfa Antibiotics   . Sulfa Antibiotics Swelling  . Tuna [Fish Allergy] Hives    Prior to Admission medications   Medication Sig Start Date End Date Taking? Authorizing Provider  albuterol (PROVENTIL HFA;VENTOLIN HFA) 108 (90 Base) MCG/ACT inhaler INHALE 2 PUFFS BY MOUTH EVERY  4 HOURS AS NEEDED FOR WHEEZING OR  SHORTNESS  OF  BREATH 10/01/18  Yes Rutherford Guys, MD  ALPRAZolam Duanne Moron) 0.25 MG tablet Take 0.5-1 tablets (0.125-0.25 mg total) by mouth daily as needed for anxiety. 10/22/17  Yes Tereasa Coop, PA-C  Cholecalciferol (VITAMIN D) 125 MCG (5000 UT) CAPS Take 5,000 Units by mouth daily.    Yes [provider]  conjugated estrogens (PREMARIN) vaginal cream Place 0.5 Applicatorfuls vaginally 2 (two) times a week. Patient taking differently: Place 0.5 Applicatorfuls vaginally every 30 (thirty) days.  06/17/18  Yes Rutherford Guys, MD  DULoxetine (CYMBALTA) 30 MG capsule Take 1 capsule (30 mg total) by mouth daily. 06/19/19  Yes  Rutherford Guys, MD  Evolocumab (REPATHA SURECLICK) [id number] MG/ML SOAJ Inject 140 mg into the skin every 14 (fourteen) days. 04/09/19  Yes Adrian Prows, MD  fluticasone [facility]) 50 MCG/ACT nasal spray USE TWO SPRAY(S) IN EACH NOSTRIL ONCE DAILY 02/28/19  Yes Rutherford Guys, MD  glucose blood (ONETOUCH VERIO) test strip Use as instructed to check blood sugar once a day at various times-Dx code E11.9 02/28/19  Yes Rutherford Guys, MD  metFORMIN (GLUCOPHAGE-XR) 500 MG 24 hr tablet Take 2 tablets by mouth twice daily 09/23/18  Yes Rutherford Guys, MD  pregabalin (LYRICA) 25 MG capsule Take by mouth daily. 03/14/19 03/13/20 Yes [provider]  risedronate (ACTONEL) 35 MG tablet TAKE 1 TABLET BY MOUTH ONCE A WEEK WITH WATER ON EMPTY STOMACH, NOTHING BY MOUTH OR LIE  DOWN  FOR NEXT 30 MINUTES 11/14/18  Yes Elayne Snare, MD  Dulaglutide (TRULICITY) [id number] SOPN Inject 0.75 mg into the skin once a week. Patient not taking: Reported on 07/10/2019 02/28/19   Rutherford Guys, MD  telmisartan (MICARDIS) 40 MG tablet Take 1 tablet (40 mg total) by mouth daily. 02/28/19 07/10/19  Rutherford Guys, MD    Past Medical History:  Diagnosis Date  . Abnormal CT scan, chest    stable  . Allergy   . Anxiety   . Asthma   . Asthma   . Cervical disc disease   . Diabetes mellitus   . Diabetes mellitus without complication ([place])   . Discoid lupus   . Gallbladder polyp   . Hyperlipidemia   . Hypertension   . Lymphadenopathy   . Postmenopausal HRT (hormone replacement therapy)   . Pulmonary embolism ([place]) At age 15    With associated DVT  . Renal cyst   . Shingles 12/2010    Past Surgical History:  Procedure Laterality Date  . APPENDECTOMY     child  . APPENDECTOMY    . BREAST SURGERY    . LAPAROSCOPIC HELLER MYOTOMY    . NECK SURGERY     Disk and rod  . SPINE SURGERY  2004   c spine  . TONSILLECTOMY AND ADENOIDECTOMY    . TUBAL LIGATION      Social History   Tobacco Use  . Smoking  status: Former Smoker    Packs/day: 0.25    Years: 35.00    Pack years: 8.75    Types: Cigarettes    Quit date: 2018    Years since quitting: 3.1  . Smokeless tobacco: Former Systems developer  . Tobacco comment: Encouraged to remain smoke free.  Substance Use Topics  . Alcohol use: Not Currently    Alcohol/week: 0.0 standard drinks    Family History  Problem Relation Age of Onset  . Osteoporosis Mother   .  Osteoporosis Sister   . Breast cancer Daughter 10  . Dementia Maternal Grandmother   . Stroke Maternal Grandfather     Review of Systems  Constitutional: Negative for chills and fever.  Respiratory: Negative for cough and shortness of breath.   Cardiovascular: Negative for chest pain, palpitations and leg swelling.  Gastrointestinal: Negative for abdominal pain, nausea and vomiting.     OBJECTIVE:  Today's Vitals   09/18/19 1338  BP: 129/81  Pulse: 71  Temp: 98.2 F (36.8 C)  SpO2: 97%  Weight: 180 lb 9.6 oz (81.9 kg)  Height: 5\' 3"  (1.6 m)   Body mass index is 31.99 kg/m.   Physical Exam Vitals and nursing note reviewed.  Constitutional:      Appearance: She is well-developed.  HENT:     Head: Normocephalic and atraumatic.     Mouth/Throat:     Pharynx: No oropharyngeal exudate.  Eyes:     General: No scleral icterus.    Conjunctiva/sclera: Conjunctivae normal.     Pupils: Pupils are equal, round, and reactive to light.  Cardiovascular:     Rate and Rhythm: Normal rate and regular rhythm.     Heart sounds: Normal heart sounds. No murmur. No friction rub. No gallop.   Pulmonary:     Effort: Pulmonary effort is normal.     Breath sounds: Normal breath sounds. No wheezing or rales.  Musculoskeletal:     Cervical back: Neck supple.  Skin:    General: Skin is warm and dry.  Neurological:     Mental Status: She is alert and oriented to person, place, and time.     No results found for this or any previous visit (from the past 24 hour(s)).  No results  found.   ASSESSMENT and PLAN  1. Type 2 diabetes mellitus with diabetic polyneuropathy, without long-term current use of insulin (HCC) Recheck 123456 as off trulicity, if above goal consider trial of different GLP1. Discussed LFM. Discussed trial of cymbalta with close monitoring of weight - Hemoglobin A1c  2. Essential hypertension Controlled. Continue current regime.   3. Stage 3a chronic kidney disease - Basic Metabolic Panel - Urinalysis, Routine w reflex microscopic  4. History of panic attacks pmp reviewed - ALPRAZolam (XANAX) 0.25 MG tablet; Take 0.5-1 tablets (0.125-0.25 mg total) by mouth daily as needed for anxiety.  5. Adjustment disorder with depressed mood See #1. Consider counseling  Return in about 4 weeks (around 10/16/2019) for depression/ weight.    Rutherford Guys, MD Primary Care at [place],  [address] Ph.  [phone] Fax [phone]

## 2019-09-18 NOTE — Patient Instructions (Signed)
° ° ° °  If you have lab work done today you will be contacted with your lab results within the next 2 weeks.  If you have not heard from us then please contact us. The fastest way to get your results is to register for My Chart. ° ° °IF you received an x-ray today, you will receive an invoice from [facility]. Please contact [facility] at [phone] with questions or concerns regarding your invoice.  ° °IF you received labwork today, you will receive an invoice from [facility]. Please contact [facility] at [phone] with questions or concerns regarding your invoice.  ° °Our billing staff will not be able to assist you with questions regarding bills from these companies. ° °You will be contacted with the lab results as soon as they are available. The fastest way to get your results is to activate your My Chart account. Instructions are located on the last page of this paperwork. If you have not heard from us regarding the results in 2 weeks, please contact this office. °  ° ° ° °

## 2019-09-19 LAB — MICROSCOPIC EXAMINATION: Epithelial Cells (non renal): >10 /hpf — AB (ref 0–10)

## 2019-09-19 LAB — URINALYSIS, ROUTINE W REFLEX MICROSCOPIC
Bilirubin, UA: NEGATIVE
Glucose, UA: NEGATIVE
Ketones, UA: NEGATIVE
Nitrite, UA: NEGATIVE
Protein,UA: NEGATIVE
RBC, UA: NEGATIVE
Specific Gravity, UA: 1.018 (ref 1.005–1.030)
Urobilinogen, Ur: 0.2 mg/dL (ref 0.2–1.0)
pH, UA: 5.5 (ref 5.0–7.5)

## 2019-09-19 LAB — HEMOGLOBIN A1C
Est. average glucose Bld gHb Est-mCnc: 148 mg/dL
Hgb A1c MFr Bld: 6.8 % — ABNORMAL HIGH (ref 4.8–5.6)

## 2019-09-19 LAB — BASIC METABOLIC PANEL
BUN/Creatinine Ratio: 21 (ref 12–28)
BUN: 26 mg/dL (ref 8–27)
CO2: 18 mmol/L — ABNORMAL LOW (ref 20–29)
Calcium: 10 mg/dL (ref 8.7–10.3)
Chloride: 104 mmol/L (ref 96–106)
Creatinine, Ser: 1.25 mg/dL — ABNORMAL HIGH (ref 0.57–1.00)
GFR calc Af Amer: 49 mL/min/{1.73_m2} — ABNORMAL LOW (ref >59)
GFR calc non Af Amer: 43 mL/min/{1.73_m2} — ABNORMAL LOW (ref >59)
Glucose: 109 mg/dL — ABNORMAL HIGH (ref 65–99)
Potassium: 4.9 mmol/L (ref 3.5–5.2)
Sodium: 138 mmol/L (ref 134–144)

## 2019-10-16 ENCOUNTER — Other Ambulatory Visit: Payer: Self-pay

## 2019-10-16 ENCOUNTER — Ambulatory Visit (INDEPENDENT_AMBULATORY_CARE_PROVIDER_SITE_OTHER): Payer: Medicare Other | Admitting: Family Medicine

## 2019-10-16 ENCOUNTER — Telehealth: Payer: Self-pay | Admitting: Family Medicine

## 2019-10-16 ENCOUNTER — Encounter: Payer: Self-pay | Admitting: Family Medicine

## 2019-10-16 VITALS — BP 112/80 | HR 80 | Temp 98.0°F | Ht 63.0 in | Wt 182.0 lb

## 2019-10-16 DIAGNOSIS — F4323 Adjustment disorder with mixed anxiety and depressed mood: Secondary | ICD-10-CM | POA: Diagnosis not present

## 2019-10-16 DIAGNOSIS — R21 Rash and other nonspecific skin eruption: Secondary | ICD-10-CM

## 2019-10-16 DIAGNOSIS — N1831 Chronic kidney disease, stage 3a: Secondary | ICD-10-CM | POA: Diagnosis not present

## 2019-10-16 NOTE — Patient Instructions (Signed)
° ° ° °  If you have lab work done today you will be contacted with your lab results within the next 2 weeks.  If you have not heard from us then please contact us. The fastest way to get your results is to register for My Chart. ° ° °IF you received an x-ray today, you will receive an invoice from [facility]. Please contact [facility] at [phone] with questions or concerns regarding your invoice.  ° °IF you received labwork today, you will receive an invoice from [facility]. Please contact [facility] at [phone] with questions or concerns regarding your invoice.  ° °Our billing staff will not be able to assist you with questions regarding bills from these companies. ° °You will be contacted with the lab results as soon as they are available. The fastest way to get your results is to activate your My Chart account. Instructions are located on the last page of this paperwork. If you have not heard from us regarding the results in 2 weeks, please contact this office. °  ° ° ° °

## 2019-10-16 NOTE — Progress Notes (Signed)
4/1/20212:22 PM  Alison Friedman 1945/09/24, 74 y.o., female [id number]  Chief Complaint  Patient presents with  . Follow-up    no longer taking cymbalta, causes tremors. Nt sure if she wants to try something else. Weight is staying steady also here for bp chk+    HPI:   Patient is a 74 y.o. female with past medical history significant for DM2, HTN, IBS, osteoporosis, vitamin D deficiency, vaginal atrophy, HLP with statin intolerance, essential tremorwho presents today forroutine followup  Last oV a month ago - trial of duloxetine Stopped taking as was making her essential tremors worse  Patient reports that she is the past had been diagnosed with lupus dermatitis, she was told that she "had slight lupus" and wondering if her decrease in her kidney function is related to this, she has had several episodes of malar rash of recent She has never seen rheum She reports arthralgias, elbows, wrists, knees, ankles  Feeling a bit better since able to see family after being vaccinated Reports passive SI     Depression screen [facility] 2/9 10/16/2019 07/14/2019 07/10/2019  Decreased Interest 1 0 0  Down, Depressed, Hopeless 1 0 0  PHQ - 2 Score 2 0 0  Altered sleeping 2 - -  Tired, decreased energy 2 - -  Change in appetite 0 - -  Feeling bad or failure about yourself  0 - -  Trouble concentrating 0 - -  Moving slowly or fidgety/restless 0 - -  Suicidal thoughts 1 - -  PHQ-9 Score 7 - -  Difficult doing work/chores - - -    Fall Risk  10/16/2019 09/18/2019 07/14/2019 07/10/2019 06/19/2019  Falls in the past year? 0 0 0 0 0  Number falls in past yr: 0 0 0 0 0  Injury with Fall? 0 0 0 0 0  Follow up - - Falls evaluation completed Falls evaluation completed;Education provided -     Allergies  Allergen Reactions  . Nitrofurantoin Monohyd Macro Anaphylaxis  . Penicillins Anaphylaxis  . Shellfish Allergy Anaphylaxis  . Tuna [Fish Allergy] Anaphylaxis  . Clarithromycin Other (See  Comments)    Face turns red.  . Crestor [Rosuvastatin]   . Food     tuna  . Iodinated Diagnostic Agents     Prev CT reports state anaphylaxis with IV contrast, pt also states severe reaction with contrast  . Levofloxacin   . Levofloxacin Other (See Comments)    Face turned red.  . Lipitor [Atorvastatin Calcium]   . Lipitor [Atorvastatin Calcium] Other (See Comments)    Muscle pain  . Macrolides And Ketolides   . Nitrofurantoin Monohyd Macro   . Penicillins   . Percocet [Oxycodone-Acetaminophen]   . Percocet [Oxycodone-Acetaminophen] Swelling  . Sulfa Antibiotics   . Sulfa Antibiotics Swelling  . Tuna [Fish Allergy] Hives  . Wellbutrin [Bupropion] Other (See Comments)    Made essential tremors worse    Prior to Admission medications   Medication Sig Start Date End Date Taking? Authorizing Provider  albuterol (PROVENTIL HFA;VENTOLIN HFA) 108 (90 Base) MCG/ACT inhaler INHALE 2 PUFFS BY MOUTH EVERY 4 HOURS AS NEEDED FOR WHEEZING OR  SHORTNESS  OF  BREATH 10/01/18  Yes Rutherford Guys, MD  ALPRAZolam Duanne Moron) 0.25 MG tablet Take 0.5-1 tablets (0.125-0.25 mg total) by mouth daily as needed for anxiety. 09/18/19  Yes Rutherford Guys, MD  Cholecalciferol (VITAMIN D) 125 MCG (5000 UT) CAPS Take 5,000 Units by mouth daily.    Yes [provider]  conjugated estrogens (PREMARIN) vaginal cream Place 0.5 Applicatorfuls vaginally 2 (two) times a week. Patient taking differently: Place 0.5 Applicatorfuls vaginally every 30 (thirty) days.  06/17/18  Yes Rutherford Guys, MD  Evolocumab (REPATHA SURECLICK) [id number] MG/ML SOAJ Inject 140 mg into the skin every 14 (fourteen) days. 04/09/19  Yes Adrian Prows, MD  fluticasone [facility]) 50 MCG/ACT nasal spray USE TWO SPRAY(S) IN EACH NOSTRIL ONCE DAILY 02/28/19  Yes Rutherford Guys, MD  glucose blood (ONETOUCH VERIO) test strip Use as instructed to check blood sugar once a day at various times-Dx code E11.9 02/28/19  Yes Rutherford Guys, MD  metFORMIN  (GLUCOPHAGE-XR) 500 MG 24 hr tablet Take 2 tablets by mouth twice daily 09/23/18  Yes Rutherford Guys, MD  pregabalin (LYRICA) 25 MG capsule Take by mouth daily. 03/14/19 03/13/20 Yes [provider]  risedronate (ACTONEL) 35 MG tablet TAKE 1 TABLET BY MOUTH ONCE A WEEK WITH WATER ON EMPTY STOMACH, NOTHING BY MOUTH OR LIE  DOWN  FOR NEXT 30 MINUTES 11/14/18  Yes Elayne Snare, MD  DULoxetine (CYMBALTA) 30 MG capsule Take 1 capsule (30 mg total) by mouth daily. Patient not taking: Reported on 10/16/2019 06/19/19   Rutherford Guys, MD  telmisartan (MICARDIS) 40 MG tablet Take 1 tablet (40 mg total) by mouth daily. 02/28/19 07/10/19  Rutherford Guys, MD    Past Medical History:  Diagnosis Date  . Abnormal CT scan, chest    stable  . Allergy   . Anxiety   . Asthma   . Asthma   . Cervical disc disease   . Diabetes mellitus   . Diabetes mellitus without complication ([place])   . Discoid lupus   . Gallbladder polyp   . Hyperlipidemia   . Hypertension   . Lymphadenopathy   . Postmenopausal HRT (hormone replacement therapy)   . Pulmonary embolism ([place]) At age 9    With associated DVT  . Renal cyst   . Shingles 12/2010    Past Surgical History:  Procedure Laterality Date  . APPENDECTOMY     child  . APPENDECTOMY    . BREAST SURGERY    . LAPAROSCOPIC HELLER MYOTOMY    . NECK SURGERY     Disk and rod  . SPINE SURGERY  2004   c spine  . TONSILLECTOMY AND ADENOIDECTOMY    . TUBAL LIGATION      Social History   Tobacco Use  . Smoking status: Former Smoker    Packs/day: 0.25    Years: 35.00    Pack years: 8.75    Types: Cigarettes    Quit date: 2018    Years since quitting: 3.2  . Smokeless tobacco: Former Systems developer  . Tobacco comment: Encouraged to remain smoke free.  Substance Use Topics  . Alcohol use: Not Currently    Alcohol/week: 0.0 standard drinks    Family History  Problem Relation Age of Onset  . Osteoporosis Mother   . Osteoporosis Sister   . Breast cancer  Daughter 72  . Dementia Maternal Grandmother   . Stroke Maternal Grandfather     Review of Systems  Constitutional: Negative for chills and fever.  Respiratory: Negative for cough and shortness of breath.   Cardiovascular: Negative for chest pain, palpitations and leg swelling.  Gastrointestinal: Negative for abdominal pain, nausea and vomiting.    Per hpi   OBJECTIVE:  Today's Vitals   10/16/19 1406  BP: 112/80  Pulse: 80  Temp: 98 F (  36.7 C)  SpO2: 90%  Weight: 182 lb (82.6 kg)  Height: 5\' 3"  (1.6 m)   Body mass index is 32.24 kg/m.   Physical Exam Vitals and nursing note reviewed.  Constitutional:      Appearance: She is well-developed.  HENT:     Head: Normocephalic and atraumatic.  Eyes:     General: No scleral icterus.    Conjunctiva/sclera: Conjunctivae normal.     Pupils: Pupils are equal, round, and reactive to light.  Pulmonary:     Effort: Pulmonary effort is normal.  Musculoskeletal:     Cervical back: Neck supple.  Skin:    General: Skin is warm and dry.  Neurological:     Mental Status: She is alert and oriented to person, place, and time.     No results found for this or any previous visit (from the past 24 hour(s)).  No results found.   ASSESSMENT and PLAN  1. Stage 3a chronic kidney disease 2. Malar rash - ANA W/Rfx to all if Positive - Sedimentation Rate - C-reactive protein - CBC  3. Adjustment disorder with mixed anxiety and depressed mood - Ambulatory referral to Psychology  Return in about 3 months (around 01/15/2020).    Rutherford Guys, MD Primary Care at [place], [place] [address] Ph.  [phone] Fax [phone]

## 2019-10-16 NOTE — Telephone Encounter (Signed)
Copied from [place] [id number]. Topic: General - Inquiry >> Oct 16, 2019 12:15 PM Richardo Priest, NT wrote: Reason for CRM: Patient called in stating she is needing husband to come into office with her, as she is wondering if she can say she mentally needs him there. Please advise. >> Oct 16, 2019  4:26 PM Joellyn Quails wrote: Patient completed appointment already

## 2019-10-17 LAB — ANA W/RFX TO ALL IF POSITIVE: Anti Nuclear Antibody (ANA): NEGATIVE

## 2019-10-17 LAB — CBC
Hematocrit: 38.7 % (ref 34.0–46.6)
Hemoglobin: 12.8 g/dL (ref 11.1–15.9)
MCH: 30.2 pg (ref 26.6–33.0)
MCHC: 33.1 g/dL (ref 31.5–35.7)
MCV: 91 fL (ref 79–97)
Platelets: 410 10*3/uL (ref 150–450)
RBC: 4.24 x10E6/uL (ref 3.77–5.28)
RDW: 12.4 % (ref 11.7–15.4)
WBC: 6 10*3/uL (ref 3.4–10.8)

## 2019-10-17 LAB — C-REACTIVE PROTEIN: CRP: <1 mg/L (ref 0–10)

## 2019-10-17 LAB — SEDIMENTATION RATE: Sed Rate: 9 mm/hr (ref 0–40)

## 2019-11-12 IMAGING — CT CT CHEST LUNG CANCER SCREENING LOW DOSE W/O CM
1 of 2 series · 10 of 20 positions shown, 13 images · non-contrast
Comparison: 06/12/2016.

CLINICAL DATA: Current smoker, 37 pack-year history, lung cancer
screening.

EXAM:
CT CHEST WITHOUT CONTRAST LOW-DOSE FOR LUNG CANCER SCREENING
TECHNIQUE: Multidetector CT imaging of the chest was performed following the
standard protocol without IV contrast.

[ct lung segmentation data · axial · 0.82mm/px · z∈[-324,-324]mm · 10 of 315 frames shown]
[frame 1/315  mediastinal]
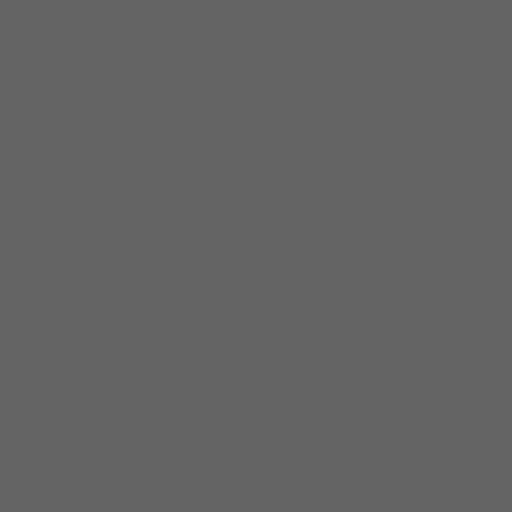
[frame 1/315  lung]
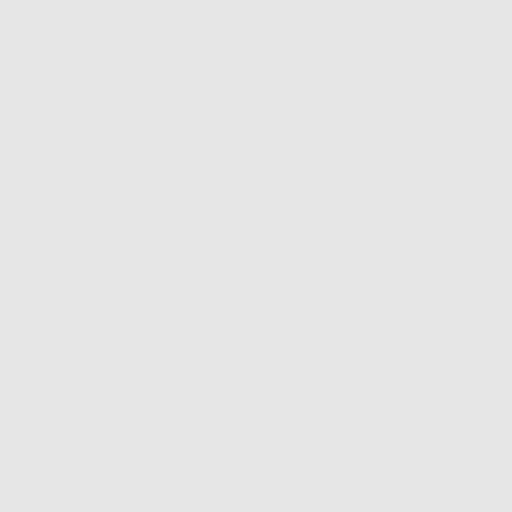
[frame 35/315  lung]
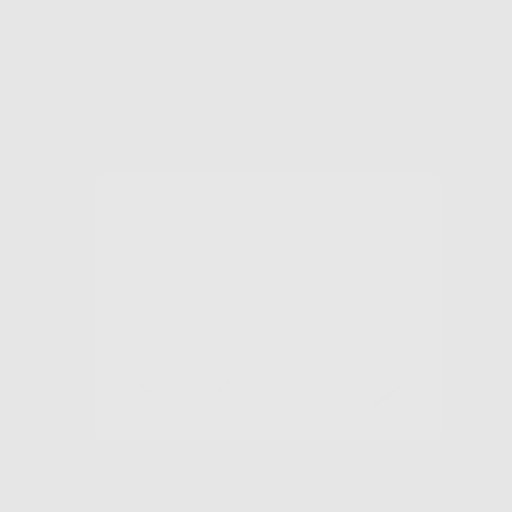
[frame 70/315  lung]
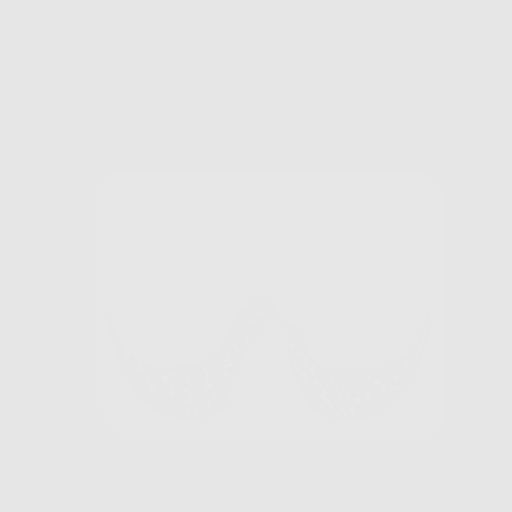
[frame 105/315  lung]
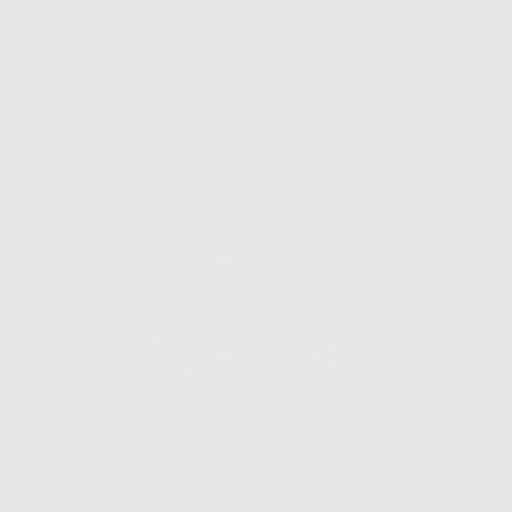
[frame 140/315  mediastinal]
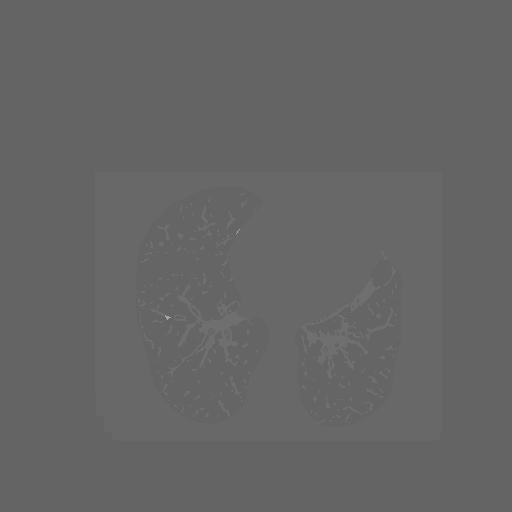
[frame 140/315  lung]
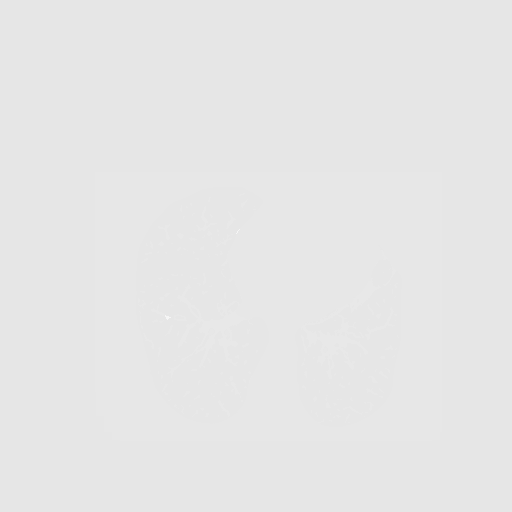
[frame 175/315  lung]
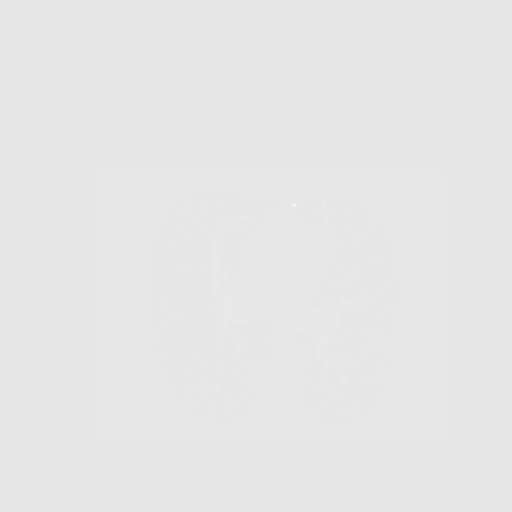
[frame 210/315  lung]
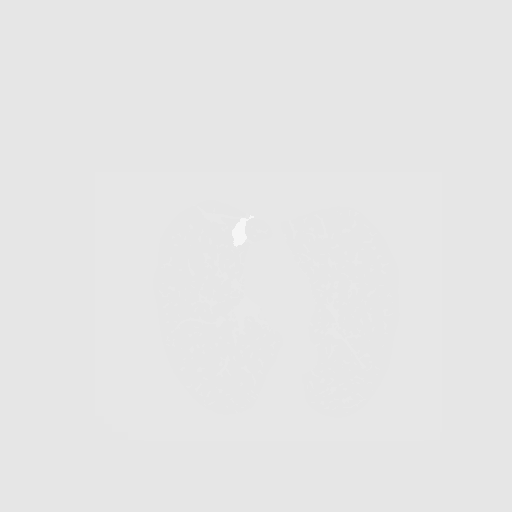
[frame 245/315  lung]
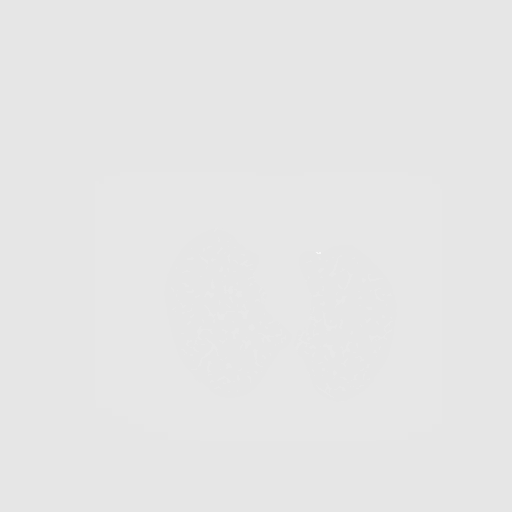
[frame 280/315  mediastinal]
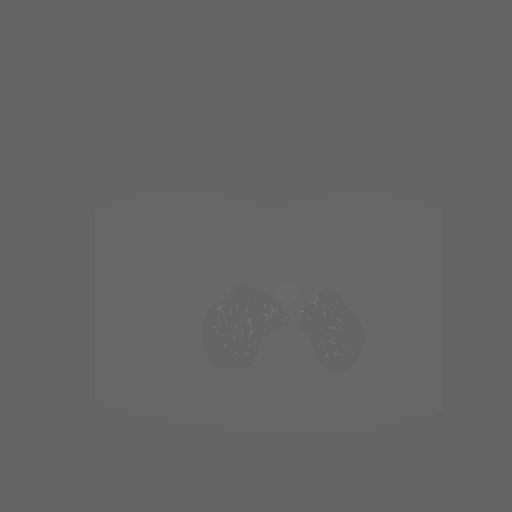
[frame 280/315  lung]
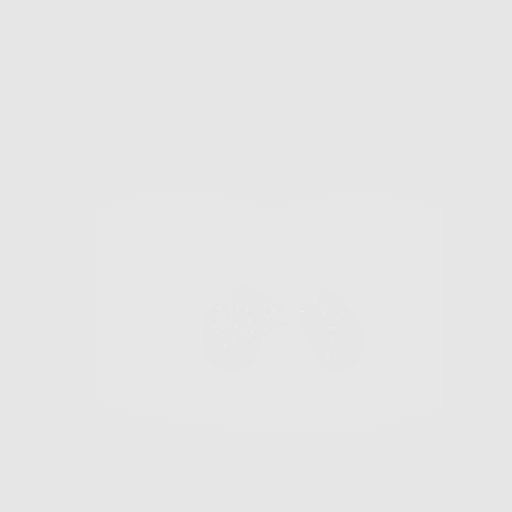
[frame 315/315  lung]
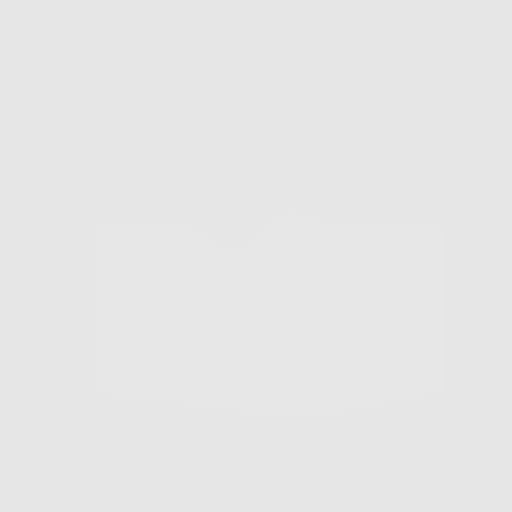

[10 of 20 positions shown; findings below may reference images not displayed]

FINDINGS: Cardiovascular: Atherosclerotic calcification of the arterial
vasculature, including moderate involvement of the coronary
arteries. Heart size normal. No pericardial effusion.

Mediastinum/Nodes: No pathologically enlarged mediastinal or
axillary lymph nodes. Hilar regions are difficult to evaluate
without IV contrast but appear grossly unremarkable. Esophagus is
grossly unremarkable.

Lungs/Pleura: Mild to moderate centrilobular emphysema. Scattered
pulmonary parenchymal scarring, as before. Scattered pulmonary
nodules measure 4.4 mm or less in size. No new pulmonary nodules. No
pleural fluid. Debris is seen in the right mainstem bronchus and
bronchus intermedius. Airway is otherwise unremarkable.

Upper Abdomen: Visualized portions of the liver, gallbladder,
adrenal glands, kidneys, spleen, pancreas, stomach and bowel are
grossly unremarkable.

Musculoskeletal: Degenerative changes in the spine. No worrisome
lytic or sclerotic lesions.
IMPRESSION: 1. Lung-RADS 2, benign appearance or behavior. Continue annual
screening with low-dose chest CT without contrast in 12 months.
2. Aortic atherosclerosis (VLNTW-170.0). Moderate coronary artery
calcification.
3.  Emphysema (VLNTW-ZRG.F).

## 2019-11-15 ENCOUNTER — Other Ambulatory Visit: Payer: Self-pay | Admitting: Endocrinology

## 2019-11-17 NOTE — Telephone Encounter (Signed)
Does pt need appt before refilling?

## 2019-11-17 NOTE — Telephone Encounter (Signed)
We can refill but she is due for an appointment now

## 2019-12-29 ENCOUNTER — Telehealth: Payer: Self-pay | Admitting: Endocrinology

## 2019-12-29 NOTE — Telephone Encounter (Signed)
LMTCB to set up appt.

## 2019-12-29 NOTE — Telephone Encounter (Signed)
-----   Message from Oval Linsey sent at 12/23/2019  4:13 PM EDT ----- Regarding: RE: Follow-up LMTCB and sent mychart message. ----- Message ----- From: Elayne Snare, MD Sent: 12/23/2019   4:07 PM EDT To: [facility] Admin Pool Subject: Follow-up                                      Patient overdue for appointment.  Please call to schedule with labs before appointment

## 2019-12-30 NOTE — Telephone Encounter (Signed)
LMTCB x2  

## 2020-01-07 ENCOUNTER — Other Ambulatory Visit: Payer: Self-pay

## 2020-01-07 ENCOUNTER — Telehealth (INDEPENDENT_AMBULATORY_CARE_PROVIDER_SITE_OTHER): Payer: Medicare Other | Admitting: Emergency Medicine

## 2020-01-07 ENCOUNTER — Encounter: Payer: Self-pay | Admitting: Emergency Medicine

## 2020-01-07 DIAGNOSIS — R0981 Nasal congestion: Secondary | ICD-10-CM

## 2020-01-07 DIAGNOSIS — J0100 Acute maxillary sinusitis, unspecified: Secondary | ICD-10-CM | POA: Diagnosis not present

## 2020-01-07 MED ORDER — PSEUDOEPHEDRINE-GUAIFENESIN ER 60-600 MG PO TB12: 1.0000 | ORAL_TABLET | Freq: Two times a day (BID) | ORAL | 1 refills | Status: AC

## 2020-01-07 MED ORDER — DOXYCYCLINE HYCLATE 100 MG PO TABS: 100.0000 mg | ORAL_TABLET | Freq: Two times a day (BID) | ORAL | 0 refills | Status: AC

## 2020-01-07 NOTE — Patient Instructions (Signed)
° ° ° °  If you have lab work done today you will be contacted with your lab results within the next 2 weeks.  If you have not heard from us then please contact us. The fastest way to get your results is to register for My Chart. ° ° °IF you received an x-ray today, you will receive an invoice from [facility]. Please contact [facility] at [phone] with questions or concerns regarding your invoice.  ° °IF you received labwork today, you will receive an invoice from [facility]. Please contact [facility] at [phone] with questions or concerns regarding your invoice.  ° °Our billing staff will not be able to assist you with questions regarding bills from these companies. ° °You will be contacted with the lab results as soon as they are available. The fastest way to get your results is to activate your My Chart account. Instructions are located on the last page of this paperwork. If you have not heard from us regarding the results in 2 weeks, please contact this office. °  ° ° ° °

## 2020-01-07 NOTE — Progress Notes (Signed)
Telemedicine Encounter- SOAP NOTE Established Patient MyChart video conference This video telephone encounter was conducted with the patient's (or proxy's) verbal consent via video audio telecommunications: yes/no: Yes Patient was instructed to have this encounter in a suitably private space; and to only have persons present to whom they give permission to participate. In addition, patient identity was confirmed by use of name plus two identifiers (DOB and address).  I discussed the limitations, risks, security and privacy concerns of performing an evaluation and management service by telephone and the availability of in person appointments. I also discussed with the patient that there may be a patient responsible charge related to this service. The patient expressed understanding and agreed to proceed.  I spent a total of TIME; 0 MIN TO 60 MIN: 20 minutes talking with the patient or their proxy.  Chief Complaint  Patient presents with  . Nasal Congestion    going on for 4 days.   . Sinusitis    nose hurt and hurts and drainage     Subjective   Alison Friedman is a 74 y.o. female established patient. Telephone visit today complaining of 3 to 4-day history of watery runny nose along with congestion and possible sinus infection.  States she usually gets them once a year.  Denies any fever or chills or any other associated symptoms.  HPI   Patient Active Problem List   Diagnosis Date Noted  . Stage 3a chronic kidney disease 06/19/2019  . Statin intolerance 07/30/2018  . Right lower quadrant abdominal pain 08/01/2017  . Esophageal stricture 05/17/2017  . Gastroesophageal reflux disease without esophagitis 05/17/2017  . Hx of adenomatous colonic polyps 05/17/2017  . Irritable bowel syndrome with constipation 05/17/2017  . Hypertension 09/26/2016  . Class 1 obesity due to excess calories with serious comorbidity and body mass index (BMI) of 32.0 to 32.9 in adult 09/26/2016  . Erosive  lichen planus of vulva 09/14/2016  . Generalized anxiety disorder 05/29/2016  . Osteoporosis 10/14/2015  . Coronary artery calcification 07/01/2015  . Smoker 07/01/2015  . COPD, moderate ([place]) 07/01/2015  . Enlargement of lymph nodes 01/01/2014  . Cervical disc disease 05/21/2012  . Recurrent sinusitis 05/21/2012  . Diabetes mellitus ([place]) 10/10/2011  . Allergic rhinitis 10/10/2011  . RAD (reactive airway disease) 10/10/2011  . Diabetes mellitus 08/22/2011  . Asthma 08/22/2011  . Anxiety   . Hyperlipidemia   . Hypertension     Past Medical History:  Diagnosis Date  . Abnormal CT scan, chest    stable  . Allergy   . Anxiety   . Asthma   . Asthma   . Cervical disc disease   . Diabetes mellitus   . Diabetes mellitus without complication ([place])   . Discoid lupus   . Gallbladder polyp   . Hyperlipidemia   . Hypertension   . Lymphadenopathy   . Postmenopausal HRT (hormone replacement therapy)   . Pulmonary embolism ([place]) At age 57    With associated DVT  . Renal cyst   . Shingles 12/2010    Current Outpatient Medications  Medication Sig Dispense Refill  . albuterol (PROVENTIL HFA;VENTOLIN HFA) 108 (90 Base) MCG/ACT inhaler INHALE 2 PUFFS BY MOUTH EVERY 4 HOURS AS NEEDED FOR WHEEZING OR  SHORTNESS  OF  BREATH 18 g 0  . ALPRAZolam (XANAX) 0.25 MG tablet Take 0.5-1 tablets (0.125-0.25 mg total) by mouth daily as needed for anxiety. 30 tablet 1  . Cholecalciferol (VITAMIN D) 125 MCG (5000 UT) CAPS  Take 5,000 Units by mouth daily.     Marland Kitchen conjugated estrogens (PREMARIN) vaginal cream Place 0.5 Applicatorfuls vaginally 2 (two) times a week. (Patient taking differently: Place 0.5 Applicatorfuls vaginally every 30 (thirty) days. ) 42.5 g 1  . Evolocumab (REPATHA SURECLICK) 035 MG/ML SOAJ Inject 140 mg into the skin every 14 (fourteen) days. 12 pen 3  . glucose blood (ONETOUCH VERIO) test strip Use as instructed to check blood sugar once a day at various times-Dx code E11.9 50 each 3   . metFORMIN (GLUCOPHAGE-XR) 500 MG 24 hr tablet Take 2 tablets by mouth twice daily 360 tablet 0  . risedronate (ACTONEL) 35 MG tablet TAKE 1 TABLET BY MOUTH ONCE A WEEK WITH WATER ON EMPTY STOMACH, NOTHING BY MOUTH OR LIE  DOWN  FOR NEXT 30 MINUTES. *needs appt for refills* 4 tablet 0  . telmisartan (MICARDIS) 40 MG tablet Take 1 tablet (40 mg total) by mouth daily. 90 tablet 3  . fluticasone (FLONASE) 50 MCG/ACT nasal spray USE TWO SPRAY(S) IN EACH NOSTRIL ONCE DAILY (Patient not taking: Reported on 01/07/2020) 16 g 4   No current facility-administered medications for this visit.    Allergies  Allergen Reactions  . Nitrofurantoin Monohyd Macro Anaphylaxis  . Penicillins Anaphylaxis  . Shellfish Allergy Anaphylaxis  . Tuna [Fish Allergy] Anaphylaxis  . Clarithromycin Other (See Comments)    Face turns red.  . Crestor [Rosuvastatin]   . Food     tuna  . Iodinated Diagnostic Agents     Prev CT reports state anaphylaxis with IV contrast, pt also states severe reaction with contrast  . Levofloxacin   . Levofloxacin Other (See Comments)    Face turned red.  . Lipitor [Atorvastatin Calcium]   . Lipitor [Atorvastatin Calcium] Other (See Comments)    Muscle pain  . Macrolides And Ketolides   . Nitrofurantoin Monohyd Macro   . Penicillins   . Percocet [Oxycodone-Acetaminophen]   . Percocet [Oxycodone-Acetaminophen] Swelling  . Sulfa Antibiotics   . Sulfa Antibiotics Swelling  . Tuna [Fish Allergy] Hives  . Wellbutrin [Bupropion] Other (See Comments)    Made essential tremors worse    Social History   Socioeconomic History  . Marital status: Married    Spouse name: Not on file  . Number of children: 2  . Years of education: 66  . Highest education level: Not on file  Occupational History  . Occupation: caretaker  . Occupation: Retired  Tobacco Use  . Smoking status: Former Smoker    Packs/day: 0.25    Years: 35.00    Pack years: 8.75    Types: Cigarettes    Quit date:  2018    Years since quitting: 3.4  . Smokeless tobacco: Former Systems developer  . Tobacco comment: Encouraged to remain smoke free.  Substance and Sexual Activity  . Alcohol use: Not Currently    Alcohol/week: 0.0 standard drinks  . Drug use: No  . Sexual activity: Yes    Birth control/protection: None  Other Topics Concern  . 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  Denies abuse and feels safe at home.        ** Data from: 08/22/11 Enc Dept: [facility]   Exercise--walking daily 20-30 min.   Social Determinants of Health   Financial Resource Strain:   . Difficulty  of Paying Living Expenses:   Food Insecurity:   . Worried About Charity fundraiser in the Last Year:   . Arboriculturist in the Last Year:   Transportation Needs:   . Film/video editor (Medical):   Marland Kitchen Lack of Transportation (Non-Medical):   Physical Activity:   . Days of Exercise per Week:   . Minutes of Exercise per Session:   Stress:   . Feeling of Stress :   Social Connections:   . Frequency of Communication with Friends and Family:   . Frequency of Social Gatherings with Friends and Family:   . Attends Religious Services:   . Active Member of Clubs or Organizations:   . Attends Archivist Meetings:   Marland Kitchen Marital Status:   Intimate Partner Violence:   . Fear of Current or Ex-Partner:   . Emotionally Abused:   Marland Kitchen Physically Abused:   . Sexually Abused:     Review of Systems  Constitutional: Negative.  Negative for chills and fever.  HENT: Positive for congestion and sinus pain. Negative for sore throat.   Respiratory: Negative.  Negative for cough and shortness of breath.   Cardiovascular: Negative.  Negative for chest pain and palpitations.  Gastrointestinal: Negative.  Negative for abdominal pain, diarrhea, nausea and vomiting.  Genitourinary: Negative.  Negative for  dysuria and hematuria.  Musculoskeletal: Negative.  Negative for back pain, myalgias and neck pain.  Skin: Negative.  Negative for rash.  Neurological: Negative for dizziness and headaches.  All other systems reviewed and are negative.   Objective  Alert and oriented x3 in no apparent respiratory distress. Vitals as reported by the patient: There were no vitals filed for this visit.  There are no diagnoses linked to this encounter. Sharri was seen today for nasal congestion and sinusitis.  Diagnoses and all orders for this visit:  Sinus congestion -     pseudoephedrine-guaifenesin (MUCINEX D) 60-600 MG 12 hr tablet; Take 1 tablet by mouth every 12 (twelve) hours for 5 days.  Acute non-recurrent maxillary sinusitis -     doxycycline (VIBRA-TABS) 100 MG tablet; Take 1 tablet (100 mg total) by mouth 2 (two) times daily for 7 days.     I discussed the assessment and treatment plan with the patient. The patient was provided an opportunity to ask questions and all were answered. The patient agreed with the plan and demonstrated an understanding of the instructions.   The patient was advised to call back or seek an in-person evaluation if the symptoms worsen or if the condition fails to improve as anticipated.  I provided 20 minutes of non-face-to-face time during this encounter.  Horald Pollen, MD  Primary Care at [facility]

## 2020-01-15 ENCOUNTER — Encounter: Payer: Self-pay | Admitting: Family Medicine

## 2020-01-15 ENCOUNTER — Other Ambulatory Visit: Payer: Self-pay

## 2020-01-15 ENCOUNTER — Ambulatory Visit (INDEPENDENT_AMBULATORY_CARE_PROVIDER_SITE_OTHER): Payer: Medicare Other | Admitting: Family Medicine

## 2020-01-15 VITALS — BP 124/82 | HR 65 | Temp 98.0°F | Ht 63.0 in | Wt 178.4 lb

## 2020-01-15 DIAGNOSIS — N1832 Chronic kidney disease, stage 3b: Secondary | ICD-10-CM | POA: Diagnosis not present

## 2020-01-15 DIAGNOSIS — J301 Allergic rhinitis due to pollen: Secondary | ICD-10-CM | POA: Diagnosis not present

## 2020-01-15 DIAGNOSIS — E119 Type 2 diabetes mellitus without complications: Secondary | ICD-10-CM | POA: Diagnosis not present

## 2020-01-15 DIAGNOSIS — J4520 Mild intermittent asthma, uncomplicated: Secondary | ICD-10-CM

## 2020-01-15 MED ORDER — ONETOUCH VERIO VI STRP: ORAL_STRIP | 3 refills | Status: AC

## 2020-01-15 MED ORDER — ALBUTEROL SULFATE HFA 108 (90 BASE) MCG/ACT IN AERS: INHALATION_SPRAY | RESPIRATORY_TRACT | 0 refills | Status: AC

## 2020-01-15 MED ORDER — HYDROCODONE-CHLORPHENIRAMINE 5-4 MG/5ML PO SOLN: 5.0000 mL | Freq: Two times a day (BID) | ORAL | 0 refills | Status: DC | PRN

## 2020-01-15 MED ORDER — TELMISARTAN 40 MG PO TABS: 40.0000 mg | ORAL_TABLET | Freq: Every day | ORAL | 3 refills | Status: AC

## 2020-01-15 MED ORDER — FLUTICASONE PROPIONATE 50 MCG/ACT NA SUSP: NASAL | 4 refills | Status: AC

## 2020-01-15 NOTE — Patient Instructions (Signed)
° ° ° °  If you have lab work done today you will be contacted with your lab results within the next 2 weeks.  If you have not heard from us then please contact us. The fastest way to get your results is to register for My Chart. ° ° °IF you received an x-ray today, you will receive an invoice from [facility]. Please contact [facility] at [phone] with questions or concerns regarding your invoice.  ° °IF you received labwork today, you will receive an invoice from [facility]. Please contact [facility] at [phone] with questions or concerns regarding your invoice.  ° °Our billing staff will not be able to assist you with questions regarding bills from these companies. ° °You will be contacted with the lab results as soon as they are available. The fastest way to get your results is to activate your My Chart account. Instructions are located on the last page of this paperwork. If you have not heard from us regarding the results in 2 weeks, please contact this office. °  ° ° ° °

## 2020-01-15 NOTE — Progress Notes (Signed)
7/1/20212:06 PM  Alison Friedman 05-Aug-1945, 74 y.o., female [id number]  Chief Complaint  Patient presents with  . Follow-up    having some post nasal drip  . Medication Refill    HPI:   Patient is a 74 y.o. female with past medical history significant for DM2, HTN, IBS, osteoporosis, vitamin D deficiency, vaginal atrophy, HLP with statin intolerance, essential tremorwho presents today forroutine followup  Last OV April 2021   She is overall doing well She waiting for in person for counseling appt Not taking as much aleve anymore as she has been working with chiropractor for her left sciatica Recently seen for acute sinus, still having PND and cough Requesting refill of cough syrup she had several years ago  Lab Results  Component Value Date   HGBA1C 6.8 (H) 09/18/2019   HGBA1C 6.6 (A) 06/19/2019   HGBA1C 7.3 (A) 02/28/2019   Lab Results  Component Value Date   MICROALBUR <0.7 11/16/2017   LDLCALC 113 (H) 06/19/2019   CREATININE 1.25 (H) 09/18/2019  GFR 43  Depression screen [facility] 2/9 01/07/2020 10/16/2019 07/14/2019  Decreased Interest 0 1 0  Down, Depressed, Hopeless 0 1 0  PHQ - 2 Score 0 2 0  Altered sleeping - 2 -  Tired, decreased energy - 2 -  Change in appetite - 0 -  Feeling bad or failure about yourself  - 0 -  Trouble concentrating - 0 -  Moving slowly or fidgety/restless - 0 -  Suicidal thoughts - 1 -  PHQ-9 Score - 7 -  Difficult doing work/chores - - -  Some recent data might be hidden    Fall Risk  01/07/2020 10/16/2019 09/18/2019 07/14/2019 07/10/2019  Falls in the past year? 0 0 0 0 0  Number falls in past yr: 0 0 0 0 0  Injury with Fall? 0 0 0 0 0  Follow up Falls evaluation completed - - Falls evaluation completed Falls evaluation completed;Education provided     Allergies  Allergen Reactions  . Nitrofurantoin Monohyd Macro Anaphylaxis  . Penicillins Anaphylaxis  . Shellfish Allergy Anaphylaxis  . Tuna [Fish Allergy] Anaphylaxis  .  Clarithromycin Other (See Comments)    Face turns red.  . Crestor [Rosuvastatin]   . Food     tuna  . Iodinated Diagnostic Agents     Prev CT reports state anaphylaxis with IV contrast, pt also states severe reaction with contrast  . Levofloxacin   . Levofloxacin Other (See Comments)    Face turned red.  . Lipitor [Atorvastatin Calcium]   . Lipitor [Atorvastatin Calcium] Other (See Comments)    Muscle pain  . Macrolides And Ketolides   . Nitrofurantoin Monohyd Macro   . Penicillins   . Percocet [Oxycodone-Acetaminophen]   . Percocet [Oxycodone-Acetaminophen] Swelling  . Sulfa Antibiotics   . Sulfa Antibiotics Swelling  . Tuna [Fish Allergy] Hives  . Wellbutrin [Bupropion] Other (See Comments)    Made essential tremors worse    Prior to Admission medications   Medication Sig Start Date End Date Taking? Authorizing Provider  albuterol (PROVENTIL HFA;VENTOLIN HFA) 108 (90 Base) MCG/ACT inhaler INHALE 2 PUFFS BY MOUTH EVERY 4 HOURS AS NEEDED FOR WHEEZING OR  SHORTNESS  OF  BREATH 10/01/18  Yes Rutherford Guys, MD  ALPRAZolam Duanne Moron) 0.25 MG tablet Take 0.5-1 tablets (0.125-0.25 mg total) by mouth daily as needed for anxiety. 09/18/19  Yes Rutherford Guys, MD  Cholecalciferol (VITAMIN D) 125 MCG (5000 UT) CAPS Take 5,000 Units by  mouth daily.    Yes [provider]  conjugated estrogens (PREMARIN) vaginal cream Place 0.5 Applicatorfuls vaginally 2 (two) times a week. Patient taking differently: Place 0.5 Applicatorfuls vaginally every 30 (thirty) days.  06/17/18  Yes Rutherford Guys, MD  Evolocumab (REPATHA SURECLICK) 962 MG/ML SOAJ Inject 140 mg into the skin every 14 (fourteen) days. 04/09/19  Yes Adrian Prows, MD  fluticasone [facility]) 50 MCG/ACT nasal spray USE TWO SPRAY(S) IN EACH NOSTRIL ONCE DAILY 02/28/19  Yes Rutherford Guys, MD  glucose blood (ONETOUCH VERIO) test strip Use as instructed to check blood sugar once a day at various times-Dx code E11.9 02/28/19  Yes Rutherford Guys, MD  metFORMIN (GLUCOPHAGE-XR) 500 MG 24 hr tablet Take 2 tablets by mouth twice daily 09/23/18  Yes Rutherford Guys, MD  risedronate (ACTONEL) 35 MG tablet TAKE 1 TABLET BY MOUTH ONCE A WEEK WITH WATER ON EMPTY STOMACH, NOTHING BY MOUTH OR LIE  DOWN  FOR NEXT 30 MINUTES. *needs appt for refills* 11/17/19  Yes Elayne Snare, MD  telmisartan (MICARDIS) 40 MG tablet Take 1 tablet (40 mg total) by mouth daily. 02/28/19 01/15/20 Yes Rutherford Guys, MD    Past Medical History:  Diagnosis Date  . Abnormal CT scan, chest    stable  . Allergy   . Anxiety   . Asthma   . Asthma   . Cervical disc disease   . Diabetes mellitus   . Diabetes mellitus without complication ([place])   . Discoid lupus   . Gallbladder polyp   . Hyperlipidemia   . Hypertension   . Lymphadenopathy   . Postmenopausal HRT (hormone replacement therapy)   . Pulmonary embolism ([place]) At age 74    With associated DVT  . Renal cyst   . Shingles 12/2010    Past Surgical History:  Procedure Laterality Date  . APPENDECTOMY     child  . APPENDECTOMY    . BREAST SURGERY    . LAPAROSCOPIC HELLER MYOTOMY    . NECK SURGERY     Disk and rod  . SPINE SURGERY  2004   c spine  . TONSILLECTOMY AND ADENOIDECTOMY    . TUBAL LIGATION      Social History   Tobacco Use  . Smoking status: Former Smoker    Packs/day: 0.25    Years: 35.00    Pack years: 8.75    Types: Cigarettes    Quit date: 2018    Years since quitting: 3.4  . Smokeless tobacco: Former Systems developer  . Tobacco comment: Encouraged to remain smoke free.  Substance Use Topics  . Alcohol use: Not Currently    Alcohol/week: 0.0 standard drinks    Family History  Problem Relation Age of Onset  . Osteoporosis Mother   . Osteoporosis Sister   . Breast cancer Daughter 37  . Dementia Maternal Grandmother   . Stroke Maternal Grandfather     Review of Systems  Constitutional: Negative for chills and fever.  Respiratory: Positive for cough. Negative for shortness of  breath.   Cardiovascular: Negative for chest pain, palpitations and leg swelling.  Gastrointestinal: Negative for abdominal pain, nausea and vomiting.   Per hpi  OBJECTIVE:  Today's Vitals   01/15/20 1401  BP: 124/82  Pulse: 65  Temp: 98 F (36.7 C)  SpO2: 97%  Weight: 178 lb 6.4 oz (80.9 kg)  Height: 5\' 3"  (1.6 m)   Body mass index is 31.6 kg/m.   Wt Readings from Last  3 Encounters:  01/15/20 178 lb 6.4 oz (80.9 kg)  10/16/19 182 lb (82.6 kg)  09/18/19 180 lb 9.6 oz (81.9 kg)     Physical Exam Vitals and nursing note reviewed.  Constitutional:      Appearance: She is well-developed.  HENT:     Head: Normocephalic and atraumatic.     Mouth/Throat:     Pharynx: No oropharyngeal exudate.  Eyes:     General: No scleral icterus.    Conjunctiva/sclera: Conjunctivae normal.     Pupils: Pupils are equal, round, and reactive to light.  Cardiovascular:     Rate and Rhythm: Normal rate and regular rhythm.     Heart sounds: Normal heart sounds. No murmur heard.  No friction rub. No gallop.   Pulmonary:     Effort: Pulmonary effort is normal.     Breath sounds: Normal breath sounds. No wheezing, rhonchi or rales.  Musculoskeletal:     Cervical back: Neck supple.     Right lower leg: No edema.     Left lower leg: No edema.  Skin:    General: Skin is warm and dry.  Neurological:     Mental Status: She is alert and oriented to person, place, and time.     No results found for this or any previous visit (from the past 24 hour(s)).  No results found.   ASSESSMENT and PLAN  1. Stage 3b chronic kidney disease Recheck BMP, d/c nsaid use. If gfr < 45 decrease metformin - Basic Metabolic Panel  2. Type 2 diabetes mellitus without complication, without long-term current use of insulin (HCC) Last a1c at goal. Cont with LFM, might need to adjust metformin to GFR - Microalbumin / creatinine urine ratio - telmisartan (MICARDIS) 40 MG tablet; Take 1 tablet (40 mg total) by  mouth daily.  3. Mild intermittent reactive airway disease without complication - albuterol (VENTOLIN HFA) 108 (90 Base) MCG/ACT inhaler; INHALE 2 PUFFS BY MOUTH EVERY 4 HOURS AS NEEDED FOR WHEEZING OR  SHORTNESS  OF  BREATH  4. Seasonal allergic rhinitis due to pollen - fluticasone (FLONASE) 50 MCG/ACT nasal spray; USE TWO SPRAY(S) IN EACH NOSTRIL ONCE DAILY  Other orders - glucose blood (ONETOUCH VERIO) test strip; Use as instructed to check blood sugar once a day at various times-Dx code E11.9 - HYDROcodone-Chlorpheniramine 5-4 MG/5ML SOLN; Take 5 mLs by mouth every 12 (twelve) hours as needed (cough).  Return in about 3 months (around 04/16/2020).    Rutherford Guys, MD Primary Care at [facility], [place] [address] Ph.  [phone] Fax [phone]

## 2020-01-16 ENCOUNTER — Telehealth: Payer: Self-pay | Admitting: Family Medicine

## 2020-01-16 LAB — BASIC METABOLIC PANEL
BUN/Creatinine Ratio: 30 — ABNORMAL HIGH (ref 12–28)
BUN: 33 mg/dL — ABNORMAL HIGH (ref 8–27)
CO2: 19 mmol/L — ABNORMAL LOW (ref 20–29)
Calcium: 9.5 mg/dL (ref 8.7–10.3)
Chloride: 106 mmol/L (ref 96–106)
Creatinine, Ser: 1.1 mg/dL — ABNORMAL HIGH (ref 0.57–1.00)
GFR calc Af Amer: 57 mL/min/{1.73_m2} — ABNORMAL LOW (ref >59)
GFR calc non Af Amer: 50 mL/min/{1.73_m2} — ABNORMAL LOW (ref >59)
Glucose: 117 mg/dL — ABNORMAL HIGH (ref 65–99)
Potassium: 4.8 mmol/L (ref 3.5–5.2)
Sodium: 138 mmol/L (ref 134–144)

## 2020-01-16 LAB — MICROALBUMIN / CREATININE URINE RATIO
Creatinine, Urine: 69.5 mg/dL
Microalb/Creat Ratio: <4 mg/g creat (ref 0–29)
Microalbumin, Urine: <3 ug/mL

## 2020-01-16 NOTE — Telephone Encounter (Signed)
[facility],  Hydrocodone strength that was sent over is no longer available and the only one available is the generic 2x the strength   If that is ok to change to please send new prescription   HYDROcodone-Chlorpheniramine HYDROcodone-Chlorpheniramine 5-4 MG/5ML SOLN

## 2020-01-16 NOTE — Telephone Encounter (Signed)
Pt is wanting a cb concerning her most recent labs. She is very concerned about this issue. Please advise at [phone].

## 2020-01-20 MED ORDER — HYDROCOD POLST-CPM POLST ER 10-8 MG/5ML PO SUER: 5.0000 mL | Freq: Two times a day (BID) | ORAL | 0 refills | Status: DC | PRN

## 2020-01-20 NOTE — Telephone Encounter (Signed)
They do not have the Hydrocodone that was sent in, please advise on changing it to update if you still want the pt to have it.  Thanks

## 2020-01-20 NOTE — Telephone Encounter (Signed)
Please advise on pt BMP results. She sees it on epic and she is concerned.

## 2020-01-20 NOTE — Telephone Encounter (Signed)
Message sent to lab pool and placed on mychart.

## 2020-01-29 DIAGNOSIS — M47812 Spondylosis without myelopathy or radiculopathy, cervical region: Secondary | ICD-10-CM | POA: Diagnosis not present

## 2020-01-29 DIAGNOSIS — M542 Cervicalgia: Secondary | ICD-10-CM | POA: Diagnosis not present

## 2020-01-29 DIAGNOSIS — M9901 Segmental and somatic dysfunction of cervical region: Secondary | ICD-10-CM | POA: Diagnosis not present

## 2020-01-29 DIAGNOSIS — M9902 Segmental and somatic dysfunction of thoracic region: Secondary | ICD-10-CM | POA: Diagnosis not present

## 2020-02-02 DIAGNOSIS — M542 Cervicalgia: Secondary | ICD-10-CM | POA: Diagnosis not present

## 2020-02-02 DIAGNOSIS — M9901 Segmental and somatic dysfunction of cervical region: Secondary | ICD-10-CM | POA: Diagnosis not present

## 2020-02-02 DIAGNOSIS — M9902 Segmental and somatic dysfunction of thoracic region: Secondary | ICD-10-CM | POA: Diagnosis not present

## 2020-02-02 DIAGNOSIS — M47812 Spondylosis without myelopathy or radiculopathy, cervical region: Secondary | ICD-10-CM | POA: Diagnosis not present

## 2020-02-05 DIAGNOSIS — M47812 Spondylosis without myelopathy or radiculopathy, cervical region: Secondary | ICD-10-CM | POA: Diagnosis not present

## 2020-02-05 DIAGNOSIS — M9901 Segmental and somatic dysfunction of cervical region: Secondary | ICD-10-CM | POA: Diagnosis not present

## 2020-02-05 DIAGNOSIS — M542 Cervicalgia: Secondary | ICD-10-CM | POA: Diagnosis not present

## 2020-02-05 DIAGNOSIS — M9902 Segmental and somatic dysfunction of thoracic region: Secondary | ICD-10-CM | POA: Diagnosis not present

## 2020-02-09 DIAGNOSIS — M9901 Segmental and somatic dysfunction of cervical region: Secondary | ICD-10-CM | POA: Diagnosis not present

## 2020-02-09 DIAGNOSIS — M9902 Segmental and somatic dysfunction of thoracic region: Secondary | ICD-10-CM | POA: Diagnosis not present

## 2020-02-09 DIAGNOSIS — M47812 Spondylosis without myelopathy or radiculopathy, cervical region: Secondary | ICD-10-CM | POA: Diagnosis not present

## 2020-02-09 DIAGNOSIS — M542 Cervicalgia: Secondary | ICD-10-CM | POA: Diagnosis not present

## 2020-02-12 DIAGNOSIS — M9901 Segmental and somatic dysfunction of cervical region: Secondary | ICD-10-CM | POA: Diagnosis not present

## 2020-02-12 DIAGNOSIS — M9902 Segmental and somatic dysfunction of thoracic region: Secondary | ICD-10-CM | POA: Diagnosis not present

## 2020-02-12 DIAGNOSIS — M542 Cervicalgia: Secondary | ICD-10-CM | POA: Diagnosis not present

## 2020-02-12 DIAGNOSIS — M47812 Spondylosis without myelopathy or radiculopathy, cervical region: Secondary | ICD-10-CM | POA: Diagnosis not present

## 2020-02-16 DIAGNOSIS — M542 Cervicalgia: Secondary | ICD-10-CM | POA: Diagnosis not present

## 2020-02-16 DIAGNOSIS — M47812 Spondylosis without myelopathy or radiculopathy, cervical region: Secondary | ICD-10-CM | POA: Diagnosis not present

## 2020-02-16 DIAGNOSIS — M9902 Segmental and somatic dysfunction of thoracic region: Secondary | ICD-10-CM | POA: Diagnosis not present

## 2020-02-16 DIAGNOSIS — M9901 Segmental and somatic dysfunction of cervical region: Secondary | ICD-10-CM | POA: Diagnosis not present

## 2020-02-19 DIAGNOSIS — M47812 Spondylosis without myelopathy or radiculopathy, cervical region: Secondary | ICD-10-CM | POA: Diagnosis not present

## 2020-02-19 DIAGNOSIS — M542 Cervicalgia: Secondary | ICD-10-CM | POA: Diagnosis not present

## 2020-02-19 DIAGNOSIS — M9901 Segmental and somatic dysfunction of cervical region: Secondary | ICD-10-CM | POA: Diagnosis not present

## 2020-02-19 DIAGNOSIS — M9902 Segmental and somatic dysfunction of thoracic region: Secondary | ICD-10-CM | POA: Diagnosis not present

## 2020-02-25 DIAGNOSIS — M9903 Segmental and somatic dysfunction of lumbar region: Secondary | ICD-10-CM | POA: Diagnosis not present

## 2020-02-25 DIAGNOSIS — M545 Low back pain: Secondary | ICD-10-CM | POA: Diagnosis not present

## 2020-02-25 DIAGNOSIS — M47816 Spondylosis without myelopathy or radiculopathy, lumbar region: Secondary | ICD-10-CM | POA: Diagnosis not present

## 2020-02-25 DIAGNOSIS — M9905 Segmental and somatic dysfunction of pelvic region: Secondary | ICD-10-CM | POA: Diagnosis not present

## 2020-03-01 DIAGNOSIS — M545 Low back pain: Secondary | ICD-10-CM | POA: Diagnosis not present

## 2020-03-01 DIAGNOSIS — M9905 Segmental and somatic dysfunction of pelvic region: Secondary | ICD-10-CM | POA: Diagnosis not present

## 2020-03-01 DIAGNOSIS — M9903 Segmental and somatic dysfunction of lumbar region: Secondary | ICD-10-CM | POA: Diagnosis not present

## 2020-03-01 DIAGNOSIS — M47816 Spondylosis without myelopathy or radiculopathy, lumbar region: Secondary | ICD-10-CM | POA: Diagnosis not present

## 2020-03-10 DIAGNOSIS — M545 Low back pain: Secondary | ICD-10-CM | POA: Diagnosis not present

## 2020-03-10 DIAGNOSIS — M9903 Segmental and somatic dysfunction of lumbar region: Secondary | ICD-10-CM | POA: Diagnosis not present

## 2020-03-10 DIAGNOSIS — M47816 Spondylosis without myelopathy or radiculopathy, lumbar region: Secondary | ICD-10-CM | POA: Diagnosis not present

## 2020-03-10 DIAGNOSIS — M9905 Segmental and somatic dysfunction of pelvic region: Secondary | ICD-10-CM | POA: Diagnosis not present

## 2020-03-24 DIAGNOSIS — M9903 Segmental and somatic dysfunction of lumbar region: Secondary | ICD-10-CM | POA: Diagnosis not present

## 2020-03-24 DIAGNOSIS — M545 Low back pain: Secondary | ICD-10-CM | POA: Diagnosis not present

## 2020-03-24 DIAGNOSIS — M9905 Segmental and somatic dysfunction of pelvic region: Secondary | ICD-10-CM | POA: Diagnosis not present

## 2020-03-24 DIAGNOSIS — M47816 Spondylosis without myelopathy or radiculopathy, lumbar region: Secondary | ICD-10-CM | POA: Diagnosis not present

## 2020-03-29 DIAGNOSIS — Z1231 Encounter for screening mammogram for malignant neoplasm of breast: Secondary | ICD-10-CM | POA: Diagnosis not present

## 2020-03-29 LAB — HM MAMMOGRAPHY

## 2020-03-31 DIAGNOSIS — M545 Low back pain: Secondary | ICD-10-CM | POA: Diagnosis not present

## 2020-03-31 DIAGNOSIS — M47816 Spondylosis without myelopathy or radiculopathy, lumbar region: Secondary | ICD-10-CM | POA: Diagnosis not present

## 2020-03-31 DIAGNOSIS — M9903 Segmental and somatic dysfunction of lumbar region: Secondary | ICD-10-CM | POA: Diagnosis not present

## 2020-03-31 DIAGNOSIS — M9905 Segmental and somatic dysfunction of pelvic region: Secondary | ICD-10-CM | POA: Diagnosis not present

## 2020-04-06 ENCOUNTER — Other Ambulatory Visit: Payer: Self-pay

## 2020-04-06 DIAGNOSIS — E782 Mixed hyperlipidemia: Secondary | ICD-10-CM

## 2020-04-06 DIAGNOSIS — I2510 Atherosclerotic heart disease of native coronary artery without angina pectoris: Secondary | ICD-10-CM

## 2020-04-06 DIAGNOSIS — I2583 Coronary atherosclerosis due to lipid rich plaque: Secondary | ICD-10-CM

## 2020-04-07 DIAGNOSIS — M9903 Segmental and somatic dysfunction of lumbar region: Secondary | ICD-10-CM | POA: Diagnosis not present

## 2020-04-07 DIAGNOSIS — M9905 Segmental and somatic dysfunction of pelvic region: Secondary | ICD-10-CM | POA: Diagnosis not present

## 2020-04-07 DIAGNOSIS — M47816 Spondylosis without myelopathy or radiculopathy, lumbar region: Secondary | ICD-10-CM | POA: Diagnosis not present

## 2020-04-07 DIAGNOSIS — M545 Low back pain: Secondary | ICD-10-CM | POA: Diagnosis not present

## 2020-04-07 LAB — LIPID PANEL WITH LDL/HDL RATIO
Cholesterol, Total: 137 mg/dL (ref 100–199)
HDL: 39 mg/dL — ABNORMAL LOW (ref >39)
LDL Chol Calc (NIH): 61 mg/dL (ref 0–99)
LDL/HDL Ratio: 1.6 ratio (ref 0.0–3.2)
Triglycerides: 230 mg/dL — ABNORMAL HIGH (ref 0–149)
VLDL Cholesterol Cal: 37 mg/dL (ref 5–40)

## 2020-04-08 ENCOUNTER — Encounter: Payer: Self-pay | Admitting: Cardiology

## 2020-04-08 ENCOUNTER — Ambulatory Visit: Payer: Medicare Other | Admitting: Cardiology

## 2020-04-08 ENCOUNTER — Other Ambulatory Visit: Payer: Self-pay

## 2020-04-08 VITALS — BP 131/74 | HR 74 | Resp 15 | Ht 63.0 in | Wt 183.0 lb

## 2020-04-08 DIAGNOSIS — I2510 Atherosclerotic heart disease of native coronary artery without angina pectoris: Secondary | ICD-10-CM | POA: Diagnosis not present

## 2020-04-08 DIAGNOSIS — E118 Type 2 diabetes mellitus with unspecified complications: Secondary | ICD-10-CM | POA: Diagnosis not present

## 2020-04-08 DIAGNOSIS — I2583 Coronary atherosclerosis due to lipid rich plaque: Secondary | ICD-10-CM | POA: Diagnosis not present

## 2020-04-08 DIAGNOSIS — I10 Essential (primary) hypertension: Secondary | ICD-10-CM | POA: Diagnosis not present

## 2020-04-08 DIAGNOSIS — E782 Mixed hyperlipidemia: Secondary | ICD-10-CM | POA: Diagnosis not present

## 2020-04-08 MED ORDER — REPATHA SURECLICK 140 MG/ML ~~LOC~~ SOAJ: 140.0000 mg | SUBCUTANEOUS | 3 refills | Status: DC

## 2020-04-08 NOTE — Progress Notes (Signed)
Primary Physician/Referring:  Rutherford Guys, MD  Patient ID: Alison Friedman, female    DOB: 07/28/1945, 74 y.o.   MRN: [id number]  Chief Complaint  Patient presents with  . Follow-up    1 year  . Essential Hypertension  . Mixed Hyperlipidemia  . Coronary atherosclerosis due to lipid rich plaque   HPI:    Alison Friedman  is a 74 y.o. Caucasian female with severe statin intolerance and hyperlipidemia, DM Controlled and hypertension, mild coronary atheresclerosis (mid LAD 30%). She has not tolerated several statins and other lipid lowering agents in the past.   She was started on Repatha in March 2019, she tolerates well, does have flu like symptoms for 2 days after her injection, but able to continue to taking the medication. She is presently doing well and remains asymptomatic. She presents for annual visit.  Her main complaint today is bilateral knee pain and plans to have knee replacement.  Past Medical History:  Diagnosis Date  . Abnormal CT scan, chest    stable  . Allergy   . Anxiety   . Asthma   . Asthma   . Cervical disc disease   . Diabetes mellitus   . Diabetes mellitus without complication ([place])   . Discoid lupus   . Gallbladder polyp   . Hyperlipidemia   . Hypertension   . Lymphadenopathy   . Postmenopausal HRT (hormone replacement therapy)   . Pulmonary embolism ([place]) At age 67    With associated DVT  . Renal cyst   . Shingles 12/2010   Past Surgical History:  Procedure Laterality Date  . APPENDECTOMY     child  . APPENDECTOMY    . BREAST SURGERY    . LAPAROSCOPIC HELLER MYOTOMY    . NECK SURGERY     Disk and rod  . SPINE SURGERY  2004   c spine  . TONSILLECTOMY AND ADENOIDECTOMY    . TUBAL LIGATION     Social History   Tobacco Use  . Smoking status: Former Smoker    Packs/day: 0.25    Years: 35.00    Pack years: 8.75    Types: Cigarettes    Quit date: 2018    Years since quitting: 3.7  . Smokeless tobacco: Former Systems developer  . Tobacco comment:  Encouraged to remain smoke free.  Substance Use Topics  . Alcohol use: Not Currently    Alcohol/week: 0.0 standard drinks   Marital Status: Married   ROS  Review of Systems  Cardiovascular: Negative for chest pain, dyspnea on exertion and leg swelling.  Musculoskeletal: Positive for joint pain (bilateral knee).  Gastrointestinal: Negative for melena.   Objective   Vitals with BMI 04/08/2020 01/15/2020 10/16/2019  Height _0  _1  _2   Weight 183 lbs 178 lbs 6 oz 182 lbs  BMI 32.43 60.73 71.06  Systolic 269 485 462  Diastolic 74 82 80  Pulse 74 65 80    Blood pressure 131/74, pulse 74, resp. rate 15, height _3  (1.6 m), weight 183 lb (83 kg), SpO2 98 %. Body mass index is 32.42 kg/m.   Physical Exam Constitutional:      Appearance: She is obese.  Cardiovascular:     Rate and Rhythm: Normal rate and regular rhythm.     Pulses: Intact distal pulses.     Heart sounds: Normal heart sounds. No murmur heard.  No gallop.      Comments: No leg edema, no JVD. Pulmonary:  Effort: Pulmonary effort is normal.     Breath sounds: Normal breath sounds.  Abdominal:     General: Bowel sounds are normal.     Palpations: Abdomen is soft.    Radiology: No results found.  Laboratory examination:   Recent Labs    06/19/19 1149 09/18/19 1534 01/15/20 1455  NA 137 138 138  K 5.2 4.9 4.8  CL 102 104 106  CO2 17* 18* 19*  GLUCOSE 121* 109* 117*  BUN 31* 26 33*  CREATININE 1.43* 1.25* 1.10*  CALCIUM 10.0 10.0 9.5  GFRNONAA 36* 43* 50*  GFRAA 42* 49* 57*   CMP Latest Ref Rng & Units 01/15/2020 09/18/2019 06/19/2019  Glucose 65 - 99 mg/dL 117(H) 109(H) 121(H)  BUN 8 - 27 mg/dL 33(H) 26 31(H)  Creatinine 0.57 - 1.00 mg/dL 1.10(H) 1.25(H) 1.43(H)  Sodium 134 - 144 mmol/L 138 138 137  Potassium 3.5 - 5.2 mmol/L 4.8 4.9 5.2  Chloride 96 - 106 mmol/L 106 104 102  CO2 20 - 29 mmol/L 19(L) 18(L) 17(L)  Calcium 8.7 - 10.3 mg/dL 9.5 10.0 10.0  Total Protein 6.0 - 8.5 g/dL - - 7.0   Total Bilirubin 0.0 - 1.2 mg/dL - - 0.6  Alkaline Phos 39 - 117 IU/L - - 106  AST 0 - 40 IU/L - - 13  ALT 0 - 32 IU/L - - 14   CBC Latest Ref Rng & Units 10/16/2019 10/22/2017 10/22/2017  WBC 3.4 - 10.8 x10E3/uL 6.0 9.5 9.5  Hemoglobin 11.1 - 15.9 g/dL 12.8 13.1 13.1  Hematocrit 34.0 - 46.6 % 38.7 39.0 39.0  Platelets 150 - 450 x10E3/uL 410 423(H) 423(H)   Lipid Panel Recent Labs    06/19/19 1149 04/06/20 1118  CHOL 206* 137  TRIG 312* 230*  LDLCALC 113* 61  HDL 39* 39*  CHOLHDL 5.3*  --     HEMOGLOBIN A1C Lab Results  Component Value Date   HGBA1C 6.8 (H) 09/18/2019   MPG 143 (H) 05/09/2011   TSH No results for input(s): TSH in the last 8760 hours. Medications   Allergies  Allergen Reactions  . Nitrofurantoin Monohyd Macro Anaphylaxis  . Penicillins Anaphylaxis  . Shellfish Allergy Anaphylaxis  . Tuna [Fish Allergy] Anaphylaxis  . Clarithromycin Other (See Comments)    Face turns red.  . Crestor [Rosuvastatin]   . Food     tuna  . Iodinated Diagnostic Agents     Prev CT reports state anaphylaxis with IV contrast, pt also states severe reaction with contrast  . Levofloxacin   . Levofloxacin Other (See Comments)    Face turned red.  . Lipitor [Atorvastatin Calcium]   . Lipitor [Atorvastatin Calcium] Other (See Comments)    Muscle pain  . Macrolides And Ketolides   . Nitrofurantoin Monohyd Macro   . Penicillins   . Percocet [Oxycodone-Acetaminophen]   . Percocet [Oxycodone-Acetaminophen] Swelling  . Sulfa Antibiotics   . Sulfa Antibiotics Swelling  . Tuna [Fish Allergy] Hives  . Wellbutrin [Bupropion] Other (See Comments)    Made essential tremors worse      Current Outpatient Medications on File Prior to Visit  Medication Sig Dispense Refill  . albuterol (VENTOLIN HFA) 108 (90 Base) MCG/ACT inhaler INHALE 2 PUFFS BY MOUTH EVERY 4 HOURS AS NEEDED FOR WHEEZING OR  SHORTNESS  OF  BREATH 18 g 0  . ALPRAZolam (XANAX) 0.25 MG tablet Take 0.5-1 tablets  (0.125-0.25 mg total) by mouth daily as needed for anxiety. 30 tablet 1  .  Cholecalciferol (VITAMIN D) 125 MCG (5000 UT) CAPS Take 5,000 Units by mouth daily.     . fluticasone (FLONASE) 50 MCG/ACT nasal spray USE TWO SPRAY(S) IN EACH NOSTRIL ONCE DAILY 16 g 4  . glucose blood (ONETOUCH VERIO) test strip Use as instructed to check blood sugar once a day at various times-Dx code E11.9 50 each 3  . metFORMIN (GLUCOPHAGE-XR) 500 MG 24 hr tablet Take 2 tablets by mouth twice daily 360 tablet 0  . telmisartan (MICARDIS) 40 MG tablet Take 1 tablet (40 mg total) by mouth daily. 90 tablet 3  . dicyclomine (BENTYL) 20 MG tablet Take 20 mg by mouth as needed.     No current facility-administered medications on file prior to visit.     Cardiac Studies:   Exercise Myoview stress test 11/21/2013: 1. Resting EKG showed normal sinus rhythm, poor R wave progression, low voltage complexes. Stress EKG was negative for ischemia. Patient exercised on BRUCE PROTOCOL for 4 minutes 34 seconds. The maximum work level achieved was 7.3 MET's. Hypertensive BP response. The baseline blood pressure was 140/90 mmHg and 208/102 mmHg with exercise. The test was terminated due to achievement of the target heart rate, fatigue and dizziness. 2. Perfusion imaging study demonstrates normal perfusion without ischemia or scar. Normal left ventricular ejection fraction at 69% Impression: EKG 04/08/2018: Normal sinus rhythm at rate of 74 bpm, normal axis, right bundle branch block. No evidence of ischemia. [facility] EKG 05/30/2017.  Coronary angiogram 07/20/2005: LAD had a 25% stenosis, otherwise normal coronary arteries. Ejection fraction was normal.  EKG    EKG 04/08/2020: Normal sinus rhythm with a rate of 66 bpm. Left atrial enlargement. Normal axis. RBBB. Poor R wave progression, cannot exclude anterolateral infarct. Low voltage in precordial leads.  No significant change from 04/09/2019.   Assessment     ICD-10-CM   1. Coronary  atherosclerosis due to lipid rich plaque  I25.10 EKG 12-Lead   I25.83 Evolocumab (REPATHA SURECLICK) 937 MG/ML SOAJ  2. Mixed hyperlipidemia  E78.2 Evolocumab (REPATHA SURECLICK) 342 MG/ML SOAJ  3. Essential hypertension  I10   4. Controlled type 2 diabetes mellitus with complication, without long-term current use of insulin (HCC)  E11.8     Medications Discontinued During This Encounter  Medication Reason  . conjugated estrogens (PREMARIN) vaginal cream No longer needed (for PRN medications)  . risedronate (ACTONEL) 35 MG tablet Patient Preference  . Evolocumab (REPATHA SURECLICK) 876 MG/ML SOAJ Reorder    Meds ordered this encounter  Medications  . Evolocumab (REPATHA SURECLICK) 811 MG/ML SOAJ    Sig: Inject 140 mg into the skin every 14 (fourteen) days. 6 pens Rx    Dispense:  6 mL    Refill:  3    Recommendations:   Alison Friedman  is a 74 y.o. Caucasian female with severe statin intolerance and hyperlipidemia, DM Controlled and hypertension, mild coronary atheresclerosis (mid LAD 30%). She has not tolerated several statins and other lipid lowering agents in the past.  She is tolerating Repatha without any side effects.  Lipids are now well controlled.  With regard to hypertension, diabetes is well controlled and she does have underlying stage III chronic kidney disease which is stable.  I reviewed her labs, elevated triglycerides are clearly related to her poor eating habits and she does admit to eating excessive amounts of cheese which is her favorite food.  As she is remained stable from cardiac standpoint as she has approval for Repatha, I will request Dr. Pamella Pert to  take over her care and also prescription for Repatha.  I will see her back on a as needed basis.  Due to arthritis she is contemplating bilateral knee replacement.  I do not see any contraindication for her to have knee replacement with very low risk from cardiac standpoint.   Adrian Prows, MD, [facility] 04/08/2020, 3:55  PM Office: [phone]

## 2020-04-12 DIAGNOSIS — M9905 Segmental and somatic dysfunction of pelvic region: Secondary | ICD-10-CM | POA: Diagnosis not present

## 2020-04-12 DIAGNOSIS — M47816 Spondylosis without myelopathy or radiculopathy, lumbar region: Secondary | ICD-10-CM | POA: Diagnosis not present

## 2020-04-12 DIAGNOSIS — M545 Low back pain: Secondary | ICD-10-CM | POA: Diagnosis not present

## 2020-04-12 DIAGNOSIS — M9903 Segmental and somatic dysfunction of lumbar region: Secondary | ICD-10-CM | POA: Diagnosis not present

## 2020-04-16 ENCOUNTER — Other Ambulatory Visit: Payer: Self-pay

## 2020-04-16 ENCOUNTER — Encounter: Payer: Self-pay | Admitting: Family Medicine

## 2020-04-16 ENCOUNTER — Ambulatory Visit (INDEPENDENT_AMBULATORY_CARE_PROVIDER_SITE_OTHER): Payer: Medicare Other | Admitting: Family Medicine

## 2020-04-16 VITALS — BP 136/74 | HR 64 | Temp 98.0°F | Ht 62.0 in | Wt 180.4 lb

## 2020-04-16 DIAGNOSIS — Z8659 Personal history of other mental and behavioral disorders: Secondary | ICD-10-CM | POA: Diagnosis not present

## 2020-04-16 DIAGNOSIS — E7800 Pure hypercholesterolemia, unspecified: Secondary | ICD-10-CM | POA: Diagnosis not present

## 2020-04-16 DIAGNOSIS — L438 Other lichen planus: Secondary | ICD-10-CM

## 2020-04-16 DIAGNOSIS — I10 Essential (primary) hypertension: Secondary | ICD-10-CM | POA: Diagnosis not present

## 2020-04-16 DIAGNOSIS — N1831 Chronic kidney disease, stage 3a: Secondary | ICD-10-CM

## 2020-04-16 DIAGNOSIS — E1122 Type 2 diabetes mellitus with diabetic chronic kidney disease: Secondary | ICD-10-CM | POA: Diagnosis not present

## 2020-04-16 MED ORDER — ALPRAZOLAM 0.25 MG PO TABS: 0.1250 mg | ORAL_TABLET | Freq: Every day | ORAL | 1 refills | Status: AC | PRN

## 2020-04-16 MED ORDER — CLOBETASOL PROP EMOLLIENT BASE 0.05 % EX CREA: TOPICAL_CREAM | CUTANEOUS | 0 refills | Status: DC

## 2020-04-16 MED ORDER — METFORMIN HCL ER 500 MG PO TB24: 1000.0000 mg | ORAL_TABLET | Freq: Two times a day (BID) | ORAL | 1 refills | Status: DC

## 2020-04-16 NOTE — Progress Notes (Signed)
10/1/20212:34 PM  Alison Friedman [birth year], 74 y.o., female [id number]  Chief Complaint  Patient presents with  . Medical Management of Chronic Issues    3 m f/u     HPI:   Patient is a 74 y.o. female with past medical history significant for DM2, HTN, IBS, osteoporosis, vitamin D deficiency, vaginal atrophy, HLP with statin intolerance, essential tremorwho presents today forroutine followup  Last OV July 2021 Saw cards, Dr Einar Gip, last week, asked that PCP resume rx for repatha as patient stable from cardiac standpoint  She is overall doing well  She will be planning on pursuing knee surgery after good cardiac review Scheduled for eye doctor on nov 9th, dr Delman Cheadle.  She does her repatha at home Needs refill of clobetasol which was rx to her by obgyn who just retired, Dr Toney Rakes, lichen sclerosis after bx She takes xanax very prn, she is requesting to have a new rx of file pmp reviewed Declines flu vaccine, has had previous severe allergic  Lab Results  Component Value Date   CHOL 137 04/06/2020   HDL 39 (L) 04/06/2020   LDLCALC 61 04/06/2020   TRIG 230 (H) 04/06/2020   CHOLHDL 5.3 (H) 06/19/2019    Lab Results  Component Value Date   NA 138 01/15/2020   K 4.8 01/15/2020   CREATININE 1.10 (H) 01/15/2020   GFRNONAA 50 (L) 01/15/2020   GFRAA 57 (L) 01/15/2020   GLUCOSE 117 (H) 01/15/2020   Lab Results  Component Value Date   HGBA1C 6.8 (H) 09/18/2019   HGBA1C 6.6 (A) 06/19/2019   HGBA1C 7.3 (A) 02/28/2019   Lab Results  Component Value Date   MICROALBUR <0.7 11/16/2017   LDLCALC 61 04/06/2020   CREATININE 1.10 (H) 01/15/2020   Wt Readings from Last 3 Encounters:  04/16/20 180 lb 6.4 oz (81.8 kg)  04/08/20 183 lb (83 kg)  01/15/20 178 lb 6.4 oz (80.9 kg)   BP Readings from Last 3 Encounters:  04/16/20 136/74  04/08/20 131/74  01/15/20 124/82   Health Maintenance Due  Topic Date Due  . OPHTHALMOLOGY EXAM  03/12/2020  . HEMOGLOBIN A1C  03/20/2020     Depression screen [facility] 2/9 04/16/2020 01/07/2020 10/16/2019  Decreased Interest 0 0 1  Down, Depressed, Hopeless 0 0 1  PHQ - 2 Score 0 0 2  Altered sleeping - - 2  Tired, decreased energy - - 2  Change in appetite - - 0  Feeling bad or failure about yourself  - - 0  Trouble concentrating - - 0  Moving slowly or fidgety/restless - - 0  Suicidal thoughts - - 1  PHQ-9 Score - - 7  Difficult doing work/chores - - -  Some recent data might be hidden    Fall Risk  04/16/2020 01/07/2020 10/16/2019 09/18/2019 07/14/2019  Falls in the past year? 0 0 0 0 0  Number falls in past yr: 0 0 0 0 0  Injury with Fall? 0 0 0 0 0  Follow up Falls evaluation completed Falls evaluation completed - - Falls evaluation completed     Allergies  Allergen Reactions  . Nitrofurantoin Monohyd Macro Anaphylaxis  . Penicillins Anaphylaxis  . Shellfish Allergy Anaphylaxis  . Tuna [Fish Allergy] Anaphylaxis  . Clarithromycin Other (See Comments)    Face turns red.  . Crestor [Rosuvastatin]   . Food     tuna  . Iodinated Diagnostic Agents     Prev CT reports state anaphylaxis with IV contrast, pt  also states severe reaction with contrast  . Levofloxacin   . Levofloxacin Other (See Comments)    Face turned red.  . Lipitor [Atorvastatin Calcium]   . Lipitor [Atorvastatin Calcium] Other (See Comments)    Muscle pain  . Macrolides And Ketolides   . Nitrofurantoin Monohyd Macro   . Penicillins   . Percocet [Oxycodone-Acetaminophen]   . Percocet [Oxycodone-Acetaminophen] Swelling  . Sulfa Antibiotics   . Sulfa Antibiotics Swelling  . Tuna [Fish Allergy] Hives  . Wellbutrin [Bupropion] Other (See Comments)    Made essential tremors worse    Prior to Admission medications   Medication Sig Start Date End Date Taking? Authorizing Provider  albuterol (VENTOLIN HFA) 108 (90 Base) MCG/ACT inhaler INHALE 2 PUFFS BY MOUTH EVERY 4 HOURS AS NEEDED FOR WHEEZING OR  SHORTNESS  OF  BREATH 01/15/20  Yes Rutherford Guys,  MD  ALPRAZolam Duanne Moron) 0.25 MG tablet Take 0.5-1 tablets (0.125-0.25 mg total) by mouth daily as needed for anxiety. 09/18/19  Yes Rutherford Guys, MD  Cholecalciferol (VITAMIN D) 125 MCG (5000 UT) CAPS Take 5,000 Units by mouth daily.    Yes [provider]  dicyclomine (BENTYL) 20 MG tablet Take 20 mg by mouth as needed. 04/08/20  Yes [provider]  fluticasone (FLONASE) 50 MCG/ACT nasal spray USE TWO SPRAY(S) IN EACH NOSTRIL ONCE DAILY 01/15/20  Yes Rutherford Guys, MD  glucose blood (ONETOUCH VERIO) test strip Use as instructed to check blood sugar once a day at various times-Dx code E11.9 01/15/20  Yes Rutherford Guys, MD  metFORMIN (GLUCOPHAGE-XR) 500 MG 24 hr tablet Take 2 tablets by mouth twice daily 09/23/18  Yes Rutherford Guys, MD  telmisartan (MICARDIS) 40 MG tablet Take 1 tablet (40 mg total) by mouth daily. 01/15/20 04/16/20 Yes Rutherford Guys, MD  Evolocumab (REPATHA SURECLICK) 536 MG/ML SOAJ Inject 140 mg into the skin every 14 (fourteen) days. 6 pens Rx 04/08/20   Adrian Prows, MD    Past Medical History:  Diagnosis Date  . Abnormal CT scan, chest    stable  . Allergy   . Anxiety   . Asthma   . Asthma   . Cervical disc disease   . Diabetes mellitus   . Diabetes mellitus without complication ([place])   . Discoid lupus   . Gallbladder polyp   . Hyperlipidemia   . Hypertension   . Lymphadenopathy   . Postmenopausal HRT (hormone replacement therapy)   . Pulmonary embolism ([place]) At age 54    With associated DVT  . Renal cyst   . Shingles 12/2010    Past Surgical History:  Procedure Laterality Date  . APPENDECTOMY     child  . APPENDECTOMY    . BREAST SURGERY    . LAPAROSCOPIC HELLER MYOTOMY    . NECK SURGERY     Disk and rod  . SPINE SURGERY  2004   c spine  . TONSILLECTOMY AND ADENOIDECTOMY    . TUBAL LIGATION      Social History   Tobacco Use  . Smoking status: Former Smoker    Packs/day: 0.25    Years: 35.00    Pack years: 8.75    Types:  Cigarettes    Quit date: 2018    Years since quitting: 3.7  . Smokeless tobacco: Former Systems developer  . Tobacco comment: Encouraged to remain smoke free.  Substance Use Topics  . Alcohol use: Not Currently    Alcohol/week: 0.0 standard drinks  Family History  Problem Relation Age of Onset  . Osteoporosis Mother   . Osteoporosis Sister   . Breast cancer Daughter 64  . Dementia Maternal Grandmother   . Stroke Maternal Grandfather     Review of Systems  Constitutional: Negative for chills and fever.  Respiratory: Negative for cough and shortness of breath.   Cardiovascular: Negative for chest pain, palpitations and leg swelling.  Gastrointestinal: Negative for abdominal pain, nausea and vomiting.   Per hpi  OBJECTIVE:  Today's Vitals   04/16/20 1417  BP: 136/74  Pulse: 64  Temp: 98 F (36.7 C)  TempSrc: Temporal  SpO2: 100%  Weight: 180 lb 6.4 oz (81.8 kg)  Height: 5\' 2"  (1.575 m)   Body mass index is 33 kg/m.   Physical Exam Vitals and nursing note reviewed.  Constitutional:      Appearance: She is well-developed.  HENT:     Head: Normocephalic and atraumatic.     Mouth/Throat:     Pharynx: No oropharyngeal exudate.  Eyes:     General: No scleral icterus.    Extraocular Movements: Extraocular movements intact.     Conjunctiva/sclera: Conjunctivae normal.     Pupils: Pupils are equal, round, and reactive to light.  Cardiovascular:     Rate and Rhythm: Normal rate and regular rhythm.     Heart sounds: Normal heart sounds. No murmur heard.  No friction rub. No gallop.   Pulmonary:     Effort: Pulmonary effort is normal.     Breath sounds: Normal breath sounds. No wheezing, rhonchi or rales.  Musculoskeletal:     Cervical back: Neck supple.     Right lower leg: No edema.     Left lower leg: No edema.  Skin:    General: Skin is warm and dry.  Neurological:     Mental Status: She is alert and oriented to person, place, and time.     No results found for  this or any previous visit (from the past 24 hour(s)).  No results found.   ASSESSMENT and PLAN  1. Type 2 diabetes mellitus with stage 3a chronic kidney disease, without long-term current use of insulin ([place]) Checking labs today, medications will be adjusted as needed. Cont working on [employer] - Comprehensive metabolic panel - Hemoglobin A1c - metFORMIN (GLUCOPHAGE-XR) 500 MG 24 hr tablet; Take 2 tablets (1,000 mg total) by mouth 2 (two) times daily.  2. Erosive lichen planus of vulva rx for clobetasol given. Reviewed r/se/b  3. Essential hypertension Controlled. Continue current regime.   4. Pure hypercholesterolemia On repatha, to be managed by PCP per cards  5. History of panic attacks pmp reviewed, takes very prn. Reviewed r/se/b - ALPRAZolam (XANAX) 0.25 MG tablet; Take 0.5-1 tablets (0.125-0.25 mg total) by mouth daily as needed for anxiety.  Other orders - Clobetasol Prop Emollient Base (CLOBETASOL PROPIONATE E) 0.05 % emollient cream; Apple to affected genitalia region at bedtime once a week  No follow-ups on file.    Rutherford Guys, MD [facility] [place], [place] [address] Ph.  [phone] Fax [phone]

## 2020-04-17 LAB — COMPREHENSIVE METABOLIC PANEL
ALT: 12 IU/L (ref 0–32)
AST: 13 IU/L (ref 0–40)
Albumin/Globulin Ratio: 1.9 (ref 1.2–2.2)
Albumin: 4.1 g/dL (ref 3.7–4.7)
Alkaline Phosphatase: 107 IU/L (ref 44–121)
BUN/Creatinine Ratio: 20 (ref 12–28)
BUN: 30 mg/dL — ABNORMAL HIGH (ref 8–27)
Bilirubin Total: 0.7 mg/dL (ref 0.0–1.2)
CO2: 22 mmol/L (ref 20–29)
Calcium: 9.6 mg/dL (ref 8.7–10.3)
Chloride: 102 mmol/L (ref 96–106)
Creatinine, Ser: 1.53 mg/dL — ABNORMAL HIGH (ref 0.57–1.00)
GFR calc Af Amer: 38 mL/min/{1.73_m2} — ABNORMAL LOW (ref >59)
GFR calc non Af Amer: 33 mL/min/{1.73_m2} — ABNORMAL LOW (ref >59)
Globulin, Total: 2.2 g/dL (ref 1.5–4.5)
Glucose: 116 mg/dL — ABNORMAL HIGH (ref 65–99)
Potassium: 5.2 mmol/L (ref 3.5–5.2)
Sodium: 135 mmol/L (ref 134–144)
Total Protein: 6.3 g/dL (ref 6.0–8.5)

## 2020-04-17 LAB — HEMOGLOBIN A1C
Est. average glucose Bld gHb Est-mCnc: 157 mg/dL
Hgb A1c MFr Bld: 7.1 % — ABNORMAL HIGH (ref 4.8–5.6)

## 2020-04-19 DIAGNOSIS — M47816 Spondylosis without myelopathy or radiculopathy, lumbar region: Secondary | ICD-10-CM | POA: Diagnosis not present

## 2020-04-19 DIAGNOSIS — M9905 Segmental and somatic dysfunction of pelvic region: Secondary | ICD-10-CM | POA: Diagnosis not present

## 2020-04-19 DIAGNOSIS — M9904 Segmental and somatic dysfunction of sacral region: Secondary | ICD-10-CM | POA: Diagnosis not present

## 2020-04-19 DIAGNOSIS — M9903 Segmental and somatic dysfunction of lumbar region: Secondary | ICD-10-CM | POA: Diagnosis not present

## 2020-04-25 ENCOUNTER — Other Ambulatory Visit: Payer: Self-pay | Admitting: Family Medicine

## 2020-04-25 DIAGNOSIS — N1831 Chronic kidney disease, stage 3a: Secondary | ICD-10-CM

## 2020-04-25 DIAGNOSIS — E1122 Type 2 diabetes mellitus with diabetic chronic kidney disease: Secondary | ICD-10-CM

## 2020-04-25 MED ORDER — METFORMIN HCL ER 500 MG PO TB24: 500.0000 mg | ORAL_TABLET | Freq: Two times a day (BID) | ORAL | 1 refills | Status: AC

## 2020-04-26 DIAGNOSIS — M9904 Segmental and somatic dysfunction of sacral region: Secondary | ICD-10-CM | POA: Diagnosis not present

## 2020-04-26 DIAGNOSIS — M9905 Segmental and somatic dysfunction of pelvic region: Secondary | ICD-10-CM | POA: Diagnosis not present

## 2020-04-26 DIAGNOSIS — M47816 Spondylosis without myelopathy or radiculopathy, lumbar region: Secondary | ICD-10-CM | POA: Diagnosis not present

## 2020-04-26 DIAGNOSIS — M9903 Segmental and somatic dysfunction of lumbar region: Secondary | ICD-10-CM | POA: Diagnosis not present

## 2020-05-03 DIAGNOSIS — M9905 Segmental and somatic dysfunction of pelvic region: Secondary | ICD-10-CM | POA: Diagnosis not present

## 2020-05-03 DIAGNOSIS — M47816 Spondylosis without myelopathy or radiculopathy, lumbar region: Secondary | ICD-10-CM | POA: Diagnosis not present

## 2020-05-03 DIAGNOSIS — M9904 Segmental and somatic dysfunction of sacral region: Secondary | ICD-10-CM | POA: Diagnosis not present

## 2020-05-03 DIAGNOSIS — M9903 Segmental and somatic dysfunction of lumbar region: Secondary | ICD-10-CM | POA: Diagnosis not present

## 2020-05-12 DIAGNOSIS — M9905 Segmental and somatic dysfunction of pelvic region: Secondary | ICD-10-CM | POA: Diagnosis not present

## 2020-05-12 DIAGNOSIS — M47816 Spondylosis without myelopathy or radiculopathy, lumbar region: Secondary | ICD-10-CM | POA: Diagnosis not present

## 2020-05-12 DIAGNOSIS — M9903 Segmental and somatic dysfunction of lumbar region: Secondary | ICD-10-CM | POA: Diagnosis not present

## 2020-05-12 DIAGNOSIS — M9904 Segmental and somatic dysfunction of sacral region: Secondary | ICD-10-CM | POA: Diagnosis not present

## 2020-05-19 DIAGNOSIS — M47816 Spondylosis without myelopathy or radiculopathy, lumbar region: Secondary | ICD-10-CM | POA: Diagnosis not present

## 2020-05-19 DIAGNOSIS — M9905 Segmental and somatic dysfunction of pelvic region: Secondary | ICD-10-CM | POA: Diagnosis not present

## 2020-05-19 DIAGNOSIS — M9903 Segmental and somatic dysfunction of lumbar region: Secondary | ICD-10-CM | POA: Diagnosis not present

## 2020-05-19 DIAGNOSIS — M9904 Segmental and somatic dysfunction of sacral region: Secondary | ICD-10-CM | POA: Diagnosis not present

## 2020-05-25 DIAGNOSIS — H25813 Combined forms of age-related cataract, bilateral: Secondary | ICD-10-CM | POA: Diagnosis not present

## 2020-05-25 DIAGNOSIS — H524 Presbyopia: Secondary | ICD-10-CM | POA: Diagnosis not present

## 2020-05-25 DIAGNOSIS — E119 Type 2 diabetes mellitus without complications: Secondary | ICD-10-CM | POA: Diagnosis not present

## 2020-05-25 DIAGNOSIS — H5203 Hypermetropia, bilateral: Secondary | ICD-10-CM | POA: Diagnosis not present

## 2020-05-25 LAB — HM DIABETES EYE EXAM

## 2020-06-08 DIAGNOSIS — Z23 Encounter for immunization: Secondary | ICD-10-CM | POA: Diagnosis not present

## 2020-06-14 DIAGNOSIS — H25043 Posterior subcapsular polar age-related cataract, bilateral: Secondary | ICD-10-CM | POA: Diagnosis not present

## 2020-06-14 DIAGNOSIS — H25013 Cortical age-related cataract, bilateral: Secondary | ICD-10-CM | POA: Diagnosis not present

## 2020-07-17 DIAGNOSIS — Z20828 Contact with and (suspected) exposure to other viral communicable diseases: Secondary | ICD-10-CM | POA: Diagnosis not present

## 2020-07-20 ENCOUNTER — Ambulatory Visit: Payer: Medicare Other

## 2020-07-20 ENCOUNTER — Other Ambulatory Visit: Payer: Self-pay | Admitting: *Deleted

## 2020-07-20 DIAGNOSIS — Z87891 Personal history of nicotine dependence: Secondary | ICD-10-CM

## 2020-07-21 ENCOUNTER — Ambulatory Visit: Payer: Medicare Other

## 2020-08-10 ENCOUNTER — Other Ambulatory Visit: Payer: Medicare Other

## 2020-08-10 DIAGNOSIS — Z20822 Contact with and (suspected) exposure to covid-19: Secondary | ICD-10-CM | POA: Diagnosis not present

## 2020-08-12 LAB — SARS-COV-2, NAA 2 DAY TAT

## 2020-08-12 LAB — NOVEL CORONAVIRUS, NAA: SARS-CoV-2, NAA: DETECTED — AB

## 2020-08-13 ENCOUNTER — Telehealth: Payer: Self-pay | Admitting: Unknown Physician Specialty

## 2020-08-13 NOTE — Telephone Encounter (Signed)
Called to discuss with patient about COVID-19 symptoms and the use of one of the available treatments for those with mild to moderate Covid symptoms and at a high risk of hospitalization.  Pt appears to qualify for outpatient treatment due to co-morbid conditions and/or a member of an at-risk group in accordance with the FDA Emergency Use Authorization.    Symptom onset: ? Vaccinated: yes I Unable to reach pt - LMOM and mychart  [employer]

## 2020-08-16 ENCOUNTER — Telehealth (INDEPENDENT_AMBULATORY_CARE_PROVIDER_SITE_OTHER): Payer: Medicare Other | Admitting: Family Medicine

## 2020-08-16 ENCOUNTER — Encounter: Payer: Self-pay | Admitting: Family Medicine

## 2020-08-16 DIAGNOSIS — U071 COVID-19: Secondary | ICD-10-CM | POA: Diagnosis not present

## 2020-08-16 DIAGNOSIS — R059 Cough, unspecified: Secondary | ICD-10-CM

## 2020-08-16 MED ORDER — HYDROCOD POLST-CPM POLST ER 10-8 MG/5ML PO SUER: 5.0000 mL | Freq: Two times a day (BID) | ORAL | 0 refills | Status: DC | PRN

## 2020-08-16 MED ORDER — BENZONATATE 100 MG PO CAPS: 100.0000 mg | ORAL_CAPSULE | Freq: Three times a day (TID) | ORAL | 0 refills | Status: DC | PRN

## 2020-08-16 NOTE — Patient Instructions (Addendum)
 COVID-19: What to Do if You Are Sick If you have a fever, cough or other symptoms, you might have COVID-19. Most people have mild illness and are able to recover at home. If you are sick:  Keep track of your symptoms.  If you have an emergency warning sign (including trouble breathing), call 911. Steps to help prevent the spread of COVID-19 if you are sick If you are sick with COVID-19 or think you might have COVID-19, follow the steps below to care for yourself and to help protect other people in your home and community. Stay home except to get medical care  Stay home. Most people with COVID-19 have mild illness and can recover at home without medical care. Do not leave your home, except to get medical care. Do not visit public areas.  Take care of yourself. Get rest and stay hydrated. Take over-the-counter medicines, such as acetaminophen, to help you feel better.  Stay in touch with your doctor. Call before you get medical care. Be sure to get care if you have trouble breathing, or have any other emergency warning signs, or if you think it is an emergency.  Avoid public transportation, ride-sharing, or taxis. Separate yourself from other people As much as possible, stay in a specific room and away from other people and pets in your home. If possible, you should use a separate bathroom. If you need to be around other people or animals in or outside of the home, wear a mask. Tell your close contactsthat they may have been exposed to COVID-19. An infected person can spread COVID-19 starting 48 hours (or 2 days) before the person has any symptoms or tests positive. By letting your close contacts know they may have been exposed to COVID-19, you are helping to protect everyone.  Additional guidance is available for those living in close quarters and shared housing.  See COVID-19 and Animals if you have questions about pets.  If you are diagnosed with COVID-19, someone from the health  department may call you. Answer the call to slow the spread. Monitor your symptoms  Symptoms of COVID-19 include fever, cough, or other symptoms.  Follow care instructions from your healthcare provider and local health department. Your local health authorities may give instructions on checking your symptoms and reporting information. When to seek emergency medical attention Look for emergency warning signs* for COVID-19. If someone is showing any of these signs, seek emergency medical care immediately:  Trouble breathing  Persistent pain or pressure in the chest  New confusion  Inability to wake or stay awake  Pale, gray, or blue-colored skin, lips, or nail beds, depending on skin tone *This list is not all possible symptoms. Please call your medical provider for any other symptoms that are severe or concerning to you. Call 911 or call ahead to your local emergency facility: Notify the operator that you are seeking care for someone who has or may have COVID-19. Call ahead before visiting your doctor  Call ahead. Many medical visits for routine care are being postponed or done by phone or telemedicine.  If you have a medical appointment that cannot be postponed, call your doctor's office, and tell them you have or may have COVID-19. This will help the office protect themselves and other patients. Get  tested  If you have symptoms of COVID-19, get tested. While waiting for test results, you stay away from others, including staying apart from those living in your household.  You can visit your state,   tribal, local, and territorialhealth department's website to look for the latest local information on testing sites. If you are sick, wear a mask over your nose and mouth  You should wear a mask over your nose and mouth if you must be around other people or animals, including pets (even at home).  You don't need to wear the mask if you are alone. If you can't put on a mask (because of  trouble breathing, for example), cover your coughs and sneezes in some other way. Try to stay at least 6 feet away from other people. This will help protect the people around you.  Masks should not be placed on young children under age 2 years, anyone who has trouble breathing, or anyone who is not able to remove the mask without help. Note: During the COVID-19 pandemic, medical grade facemasks are reserved for healthcare workers and some first responders. Cover your coughs and sneezes  Cover your mouth and nose with a tissue when you cough or sneeze.  Throw away used tissues in a lined trash can.  Immediately wash your hands with soap and water for at least 20 seconds. If soap and water are not available, clean your hands with an alcohol-based hand sanitizer that contains at least 60% alcohol. Clean your hands often  Wash your hands often with soap and water for at least 20 seconds. This is especially important after blowing your nose, coughing, or sneezing; going to the bathroom; and before eating or preparing food.  Use hand sanitizer if soap and water are not available. Use an alcohol-based hand sanitizer with at least 60% alcohol, covering all surfaces of your hands and rubbing them together until they feel dry.  Soap and water are the best option, especially if hands are visibly dirty.  Avoid touching your eyes, nose, and mouth with unwashed hands.  Handwashing Tips Avoid sharing personal household items  Do not share dishes, drinking glasses, cups, eating utensils, towels, or bedding with other people in your home.  Wash these items thoroughly after using them with soap and water or put in the dishwasher. Clean all "high-touch" surfaces everyday  Clean and disinfect high-touch surfaces in your "sick room" and bathroom; wear disposable gloves. Let someone else clean and disinfect surfaces in common areas, but you should clean your bedroom and bathroom, if possible.  If a  caregiver or other person needs to clean and disinfect a sick person's bedroom or bathroom, they should do so on an as-needed basis. The caregiver/other person should wear a mask and disposable gloves prior to cleaning. They should wait as long as possible after the person who is sick has used the bathroom before coming in to clean and use the bathroom. ? High-touch surfaces include phones, remote controls, counters, tabletops, doorknobs, bathroom fixtures, toilets, keyboards, tablets, and bedside tables.  Clean and disinfect areas that may have blood, stool, or body fluids on them.  Use household cleaners and disinfectants. Clean the area or item with soap and water or another detergent if it is dirty. Then, use a household disinfectant. ? Be sure to follow the instructions on the label to ensure safe and effective use of the product. Many products recommend keeping the surface wet for several minutes to ensure germs are killed. Many also recommend precautions such as wearing gloves and making sure you have good ventilation during use of the product. ? Use a product from EPA's List N: Disinfectants for Coronavirus (COVID-19). ? Complete Disinfection Guidance When you can   be around others after being sick with COVID-19 Deciding when you can be around others is different for different situations. Find out when you can safely end home isolation. For any additional questions about your care, contact your healthcare provider or state or local health department. 10/01/2019 Content source: National Center for Immunization and Respiratory Diseases (NCIRD), Division of Viral Diseases This information is not intended to replace advice given to you by your health care provider. Make sure you discuss any questions you have with your health care provider. Document Revised: 05/17/2020 Document Reviewed: 05/17/2020 Elsevier Patient Education  2021 Elsevier Inc.   If you have lab work done today you will be  contacted with your lab results within the next 2 weeks.  If you have not heard from us then please contact us. The fastest way to get your results is to register for My Chart.   IF you received an x-ray today, you will receive an invoice from [facility]. Please contact [facility] at [phone] with questions or concerns regarding your invoice.   IF you received labwork today, you will receive an invoice from [facility]. Please contact [facility] at [phone] with questions or concerns regarding your invoice.   Our billing staff will not be able to assist you with questions regarding bills from these companies.  You will be contacted with the lab results as soon as they are available. The fastest way to get your results is to activate your My Chart account. Instructions are located on the last page of this paperwork. If you have not heard from us regarding the results in 2 weeks, please contact this office.      

## 2020-08-16 NOTE — Progress Notes (Signed)
Virtual Visit Note  I connected with patient on 08/16/20 at 0854 by telephone due to unable to work Epic video visit and verified that I am speaking with the correct person using two identifiers. Alison Friedman is currently located at home and no family members are currently with them during visit. The provider, Laurita Quint Isabel Freese, FNP is located in their office at time of visit.  I discussed the limitations, risks, security and privacy concerns of performing an evaluation and management service by telephone and the availability of in person appointments. I also discussed with the patient that there may be a patient responsible charge related to this service. The patient expressed understanding and agreed to proceed.   I provided 20 minutes of non-face-to-face time during this encounter.  Chief Complaint  Patient presents with  . Covid Positive     body aches, cough and congestion and drainage,   no fevers in last 24 hr , taking tylenol , multiple drug allergies and is requesting certain cough syrup last prescribed in July 2021     HPI ? Tested positive for covid on the 25th Symptoms started the 23rd  Symptoms are improving Has needed minimal use of albuterol Has been taking Mucinex and Tylenol Mucus is thin Requesting a similar cough syrup to what she used in the past without issues Had been taking a lot of ibuprofen: need to recheck creatine  Has increased fluid intake Decreased Metformin last OV due to increased creatinine   Allergies  Allergen Reactions  . Nitrofurantoin Monohyd Macro Anaphylaxis  . Penicillins Anaphylaxis  . Shellfish Allergy Anaphylaxis  . Tuna [Fish Allergy] Anaphylaxis  . Clarithromycin Other (See Comments)    Face turns red.  . Crestor [Rosuvastatin]   . Food     tuna  . Iodinated Diagnostic Agents     Prev CT reports state anaphylaxis with IV contrast, pt also states severe reaction with contrast  . Levofloxacin   . Levofloxacin Other (See Comments)     Face turned red.  . Lipitor [Atorvastatin Calcium]   . Lipitor [Atorvastatin Calcium] Other (See Comments)    Muscle pain  . Macrolides And Ketolides   . Nitrofurantoin Monohyd Macro   . Penicillins   . Percocet [Oxycodone-Acetaminophen]   . Percocet [Oxycodone-Acetaminophen] Swelling  . Sulfa Antibiotics   . Sulfa Antibiotics Swelling  . Tuna [Fish Allergy] Hives  . Wellbutrin [Bupropion] Other (See Comments)    Made essential tremors worse    Prior to Admission medications   Medication Sig Start Date End Date Taking? Authorizing Provider  albuterol (VENTOLIN HFA) 108 (90 Base) MCG/ACT inhaler INHALE 2 PUFFS BY MOUTH EVERY 4 HOURS AS NEEDED FOR WHEEZING OR  SHORTNESS  OF  BREATH 01/15/20  Yes Jacelyn Pi, Lilia Argue, MD  ALPRAZolam Duanne Moron) 0.25 MG tablet Take 0.5-1 tablets (0.125-0.25 mg total) by mouth daily as needed for anxiety. 04/16/20  Yes Jacelyn Pi, Lilia Argue, MD  Cholecalciferol (VITAMIN D) 125 MCG (5000 UT) CAPS Take 5,000 Units by mouth daily.    Yes [provider]  Clobetasol Prop Emollient Base (CLOBETASOL PROPIONATE E) 0.05 % emollient cream Apple to affected genitalia region at bedtime once a week 04/16/20  Yes Jacelyn Pi, Lilia Argue, MD  dicyclomine (BENTYL) 20 MG tablet Take 20 mg by mouth as needed. 04/08/20  Yes [provider]  Evolocumab (REPATHA SURECLICK) 867 MG/ML SOAJ Inject 140 mg into the skin every 14 (fourteen) days. 6 pens Rx 04/08/20  Yes Adrian Prows, MD  fluticasone (FLONASE) 50 MCG/ACT nasal spray USE TWO SPRAY(S) IN EACH NOSTRIL ONCE DAILY 01/15/20  Yes Jacelyn Pi, Irma M, MD  glucose blood [facility] VERIO) test strip Use as instructed to check blood sugar once a day at various times-Dx code E11.9 01/15/20  Yes Jacelyn Pi, Lilia Argue, MD  metFORMIN (GLUCOPHAGE-XR) 500 MG 24 hr tablet Take 1 tablet (500 mg total) by mouth 2 (two) times daily. 04/25/20  Yes Jacelyn Pi, Lilia Argue, MD  telmisartan (MICARDIS) 40 MG tablet Take 1 tablet (40 mg total)  by mouth daily. 01/15/20 04/16/20 Yes Daleen Squibb, MD    Past Medical History:  Diagnosis Date  . Abnormal CT scan, chest    stable  . Allergy   . Anxiety   . Asthma   . Asthma   . Cervical disc disease   . Diabetes mellitus   . Diabetes mellitus without complication ([place])   . Discoid lupus   . Gallbladder polyp   . Hyperlipidemia   . Hypertension   . Lymphadenopathy   . Postmenopausal HRT (hormone replacement therapy)   . Pulmonary embolism ([place]) At age 26    With associated DVT  . Renal cyst   . Shingles 12/2010    Past Surgical History:  Procedure Laterality Date  . APPENDECTOMY     child  . APPENDECTOMY    . BREAST SURGERY    . LAPAROSCOPIC HELLER MYOTOMY    . NECK SURGERY     Disk and rod  . SPINE SURGERY  2004   c spine  . TONSILLECTOMY AND ADENOIDECTOMY    . TUBAL LIGATION      Social History   Tobacco Use  . Smoking status: Former Smoker    Packs/day: 0.25    Years: 35.00    Pack years: 8.75    Types: Cigarettes    Quit date: 2018    Years since quitting: 4.0  . Smokeless tobacco: Former Systems developer  . Tobacco comment: Encouraged to remain smoke free.  Substance Use Topics  . Alcohol use: Not Currently    Alcohol/week: 0.0 standard drinks    Family History  Problem Relation Age of Onset  . Osteoporosis Mother   . Osteoporosis Sister   . Breast cancer Daughter 76  . Dementia Maternal Grandmother   . Stroke Maternal Grandfather     Review of Systems  Constitutional: Negative for chills, fever and malaise/fatigue.  HENT: Positive for congestion and sore throat. Negative for sinus pain.   Respiratory: Positive for cough and sputum production. Negative for shortness of breath and wheezing.   Cardiovascular: Negative for chest pain and palpitations.  Gastrointestinal: Positive for nausea. Negative for abdominal pain, constipation, diarrhea, heartburn and vomiting.  Musculoskeletal: Positive for myalgias.  Neurological: Negative for  headaches.    Objective  Constitutional:      General: Not in acute distress.    Appearance: Normal appearance. Not ill-appearing.   Pulmonary:     Effort: Pulmonary effort is normal. No respiratory distress.  Neurological:     Mental Status: Alert and oriented to person, place, and time.  Psychiatric:        Mood and Affect: Mood normal.        Behavior: Behavior normal.     ASSESSMENT and PLAN  Problem List Items Addressed This Visit   None   Visit Diagnoses    COVID-19    -  Primary   Relevant Medications   chlorpheniramine-HYDROcodone (TUSSIONEX PENNKINETIC ER) 10-8 MG/5ML SUER  Cough       Relevant Medications   benzonatate (TESSALON) 100 MG capsule      Plan . Treat symptoms conservatively . Tessalon pearls and tussionex sent for cough . R/se/b of medications discussed . RTC/ED precautions provided . Schedule follow up to evaluate chronic conditions (CKD, DM)    Return in about 1 month (around 09/13/2020) for chronic conditions follow up.   The above assessment and management plan was discussed with the patient. The patient verbalized understanding of and has agreed to the management plan. Patient is aware to call the clinic if symptoms persist or worsen. Patient is aware when to return to the clinic for a follow-up visit. Patient educated on when it is appropriate to go to the emergency department.     Huston Foley Jen Eppinger, FNP-BC [facility] [place], [place] [address] Ph.  [phone] Fax [phone]

## 2020-08-18 ENCOUNTER — Telehealth: Payer: Self-pay

## 2020-08-18 ENCOUNTER — Other Ambulatory Visit: Payer: Self-pay | Admitting: Family Medicine

## 2020-08-18 NOTE — Telephone Encounter (Signed)
Called pt and informed her that it would be about 2-3 day turn around time for the PA and it retails for about 30 plus dollars. I have called the pt to see what she wanted to do. Pt reports that she has just purchased it for retail at pharmacy for 48 plus. I stated understanding.

## 2020-09-08 DIAGNOSIS — I10 Essential (primary) hypertension: Secondary | ICD-10-CM | POA: Diagnosis not present

## 2020-09-08 DIAGNOSIS — M810 Age-related osteoporosis without current pathological fracture: Secondary | ICD-10-CM | POA: Diagnosis not present

## 2020-09-08 DIAGNOSIS — K2100 Gastro-esophageal reflux disease with esophagitis, without bleeding: Secondary | ICD-10-CM | POA: Diagnosis not present

## 2020-09-08 DIAGNOSIS — Z13228 Encounter for screening for other metabolic disorders: Secondary | ICD-10-CM | POA: Diagnosis not present

## 2020-09-08 DIAGNOSIS — N952 Postmenopausal atrophic vaginitis: Secondary | ICD-10-CM | POA: Diagnosis not present

## 2020-09-08 DIAGNOSIS — Z1321 Encounter for screening for nutritional disorder: Secondary | ICD-10-CM | POA: Diagnosis not present

## 2020-09-08 DIAGNOSIS — Z130 Encounter for screening for diseases of the blood and blood-forming organs and certain disorders involving the immune mechanism: Secondary | ICD-10-CM | POA: Diagnosis not present

## 2020-09-08 DIAGNOSIS — J449 Chronic obstructive pulmonary disease, unspecified: Secondary | ICD-10-CM | POA: Diagnosis not present

## 2020-09-08 DIAGNOSIS — Z789 Other specified health status: Secondary | ICD-10-CM | POA: Diagnosis not present

## 2020-09-08 DIAGNOSIS — E1121 Type 2 diabetes mellitus with diabetic nephropathy: Secondary | ICD-10-CM | POA: Diagnosis not present

## 2020-09-08 DIAGNOSIS — Z1329 Encounter for screening for other suspected endocrine disorder: Secondary | ICD-10-CM | POA: Diagnosis not present

## 2020-09-08 DIAGNOSIS — F419 Anxiety disorder, unspecified: Secondary | ICD-10-CM | POA: Diagnosis not present

## 2020-09-08 DIAGNOSIS — J302 Other seasonal allergic rhinitis: Secondary | ICD-10-CM | POA: Diagnosis not present

## 2020-09-08 DIAGNOSIS — R202 Paresthesia of skin: Secondary | ICD-10-CM | POA: Diagnosis not present

## 2020-09-08 DIAGNOSIS — E039 Hypothyroidism, unspecified: Secondary | ICD-10-CM | POA: Diagnosis not present

## 2020-09-08 DIAGNOSIS — I2510 Atherosclerotic heart disease of native coronary artery without angina pectoris: Secondary | ICD-10-CM | POA: Diagnosis not present

## 2020-09-08 DIAGNOSIS — E119 Type 2 diabetes mellitus without complications: Secondary | ICD-10-CM | POA: Diagnosis not present

## 2020-09-08 DIAGNOSIS — I2583 Coronary atherosclerosis due to lipid rich plaque: Secondary | ICD-10-CM | POA: Diagnosis not present

## 2020-09-14 DIAGNOSIS — H25041 Posterior subcapsular polar age-related cataract, right eye: Secondary | ICD-10-CM | POA: Diagnosis not present

## 2020-09-14 DIAGNOSIS — H2511 Age-related nuclear cataract, right eye: Secondary | ICD-10-CM | POA: Diagnosis not present

## 2020-09-14 DIAGNOSIS — H25811 Combined forms of age-related cataract, right eye: Secondary | ICD-10-CM | POA: Diagnosis not present

## 2020-09-14 DIAGNOSIS — H25011 Cortical age-related cataract, right eye: Secondary | ICD-10-CM | POA: Diagnosis not present

## 2020-09-14 HISTORY — PX: INTRACAPSULAR CATARACT EXTRACTION: SHX361

## 2020-10-22 DIAGNOSIS — H43811 Vitreous degeneration, right eye: Secondary | ICD-10-CM | POA: Diagnosis not present

## 2020-11-12 ENCOUNTER — Ambulatory Visit
Admission: RE | Admit: 2020-11-12 | Discharge: 2020-11-12 | Disposition: A | Discharge status: Home / Self Care | Payer: Medicare Other | Source: Ambulatory Visit | Attending: Emergency Medicine | Admitting: Emergency Medicine

## 2020-11-12 ENCOUNTER — Other Ambulatory Visit: Payer: Self-pay

## 2020-11-12 VITALS — BP 144/91 | HR 71 | Temp 98.0°F | Resp 15

## 2020-11-12 DIAGNOSIS — H60391 Other infective otitis externa, right ear: Secondary | ICD-10-CM | POA: Diagnosis not present

## 2020-11-12 MED ORDER — CEFDINIR 300 MG PO CAPS: 300.0000 mg | ORAL_CAPSULE | Freq: Two times a day (BID) | ORAL | 0 refills | Status: AC

## 2020-11-12 NOTE — ED Triage Notes (Signed)
Patient c/o RT ear pain x 2 days.   Patient states she had cataracts surgery on March 1st of this year. Prior to ear pain patient endorses having eye pain.   Patient endorses ear drainage with "crusty dried" discharge.   Patient endorses episodes where the ear " stops up and then clears upon palpation".   Patient started taking a Keflex antibiotic yesterday.

## 2020-11-12 NOTE — Discharge Instructions (Signed)
Omnicef twice daily for 10 days Tylenol as needed for pain Keep area clean and dry Follow-up for recheck

## 2020-11-12 NOTE — ED Provider Notes (Signed)
[name]    CSN: [id number] Arrival date & time: 11/12/20  4010      History   Chief Complaint Chief Complaint  Patient presents with  . Otalgia    HPI Alison Friedman is a 75 y.o. female presenting today for evaluation of ear pain.  Reports ear pain for approximately 2 days.  Reports ear pain and discomfort on right side.  Reports crusty drainage this morning.  Took 2 tablets of Keflex yesterday.  Reports tolerating Keflex despite anaphylaxis penicillin allergy.  HPI  Past Medical History:  Diagnosis Date  . Abnormal CT scan, chest    stable  . Allergy   . Anxiety   . Asthma   . Asthma   . Cervical disc disease   . Diabetes mellitus   . Diabetes mellitus without complication ([place])   . Discoid lupus   . Gallbladder polyp   . Hyperlipidemia   . Hypertension   . Lymphadenopathy   . Postmenopausal HRT (hormone replacement therapy)   . Pulmonary embolism ([place]) At age 78    With associated DVT  . Renal cyst   . Shingles 12/2010    Patient Active Problem List   Diagnosis Date Noted  . Stage 3a chronic kidney disease ([place]) 06/19/2019  . Statin intolerance 07/30/2018  . Right lower quadrant abdominal pain 08/01/2017  . Esophageal stricture 05/17/2017  . Gastroesophageal reflux disease without esophagitis 05/17/2017  . Hx of adenomatous colonic polyps 05/17/2017  . Irritable bowel syndrome with constipation 05/17/2017  . Hypertension 09/26/2016  . Class 1 obesity due to excess calories with serious comorbidity and body mass index (BMI) of 32.0 to 32.9 in adult 09/26/2016  . 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 Generalized anxiety disorder 05/29/2016  . Osteoporosis 10/14/2015  . Coronary artery calcification 07/01/2015  . Smoker 07/01/2015  . COPD, moderate ([place]) 07/01/2015  . Enlargement of lymph nodes 01/01/2014  . Cervical disc disease 05/21/2012  . Recurrent sinusitis 05/21/2012  . Diabetes mellitus ([place]) 10/10/2011  . Allergic rhinitis  10/10/2011  . RAD (reactive airway disease) 10/10/2011  . Diabetes mellitus 08/22/2011  . Asthma 08/22/2011  . Anxiety   . Hyperlipidemia   . Hypertension     Past Surgical History:  Procedure Laterality Date  . APPENDECTOMY     child  . APPENDECTOMY    . BREAST SURGERY    . INTRACAPSULAR CATARACT EXTRACTION Right 09/14/2020  . LAPAROSCOPIC HELLER MYOTOMY    . NECK SURGERY     Disk and rod  . SPINE SURGERY  2004   c spine  . TONSILLECTOMY AND ADENOIDECTOMY    . TUBAL LIGATION      OB History    Gravida  2   Para  2   Term  0   Preterm  0   AB  0   Living        SAB  0   IAB  0   Ectopic  0   Multiple      Live Births               Home Medications    Prior to Admission medications   Medication Sig Start Date End Date Taking? Authorizing Provider  cefdinir (OMNICEF) 300 MG capsule Take 1 capsule (300 mg total) by mouth 2 (two) times daily for 10 days. 11/12/20 11/22/20 Yes Warrick Llera C, PA-C  albuterol (VENTOLIN HFA) 108 (90 Base) MCG/ACT inhaler INHALE 2 PUFFS BY MOUTH EVERY 4 HOURS  AS NEEDED FOR WHEEZING OR  SHORTNESS  OF  BREATH 01/15/20   Jacelyn Pi, Lilia Argue, MD  ALPRAZolam Duanne Moron) 0.25 MG tablet Take 0.5-1 tablets (0.125-0.25 mg total) by mouth daily as needed for anxiety. 04/16/20   Daleen Squibb, MD  benzonatate (TESSALON) 100 MG capsule Take 1-2 capsules (100-200 mg total) by mouth 3 (three) times daily as needed for cough. 08/16/20   Just, Laurita Quint, FNP  Cholecalciferol (VITAMIN D) 125 MCG (5000 UT) CAPS Take 5,000 Units by mouth daily.     [provider]  Clobetasol Prop Emollient Base (CLOBETASOL PROPIONATE E) 0.05 % emollient cream Apple to affected genitalia region at bedtime once a week 04/16/20   Jacelyn Pi, Lilia Argue, MD  dicyclomine (BENTYL) 20 MG tablet Take 20 mg by mouth as needed. 04/08/20   [provider]  Evolocumab (REPATHA SURECLICK) [id number] MG/ML SOAJ Inject 140 mg into the skin every 14 (fourteen) days.  6 pens Rx 04/08/20   Adrian Prows, MD  fluticasone [facility]) 50 MCG/ACT nasal spray USE TWO SPRAY(S) IN EACH NOSTRIL ONCE DAILY 01/15/20   Jacelyn Pi, Lilia Argue, MD  glucose blood [facility] VERIO) test strip Use as instructed to check blood sugar once a day at various times-Dx code E11.9 01/15/20   Jacelyn Pi, Lilia Argue, MD  metFORMIN (GLUCOPHAGE-XR) 500 MG 24 hr tablet Take 1 tablet (500 mg total) by mouth 2 (two) times daily. 04/25/20   Daleen Squibb, MD  telmisartan (MICARDIS) 40 MG tablet Take 1 tablet (40 mg total) by mouth daily. 01/15/20 04/16/20  Daleen Squibb, MD    Family History Family History  Problem Relation Age of Onset  . Osteoporosis Mother   . Osteoporosis Sister   . Breast cancer Daughter 70  . Dementia Maternal Grandmother   . Stroke Maternal Grandfather     Social History Social History   Tobacco Use  . Smoking status: Former Smoker    Packs/day: 0.25    Years: 35.00    Pack years: 8.75    Types: Cigarettes    Quit date: 2018    Years since quitting: 4.3  . Smokeless tobacco: Former Systems developer  . Tobacco comment: Encouraged to remain smoke free.  Vaping Use  . Vaping Use: Never used  Substance Use Topics  . Alcohol use: Not Currently    Alcohol/week: 0.0 standard drinks  . Drug use: No     Allergies   Nitrofurantoin monohyd macro, Penicillins, Shellfish allergy, Tuna [fish allergy], Clarithromycin, Crestor [rosuvastatin], Food, Iodinated diagnostic agents, Levofloxacin, Levofloxacin, Lipitor [atorvastatin calcium], Lipitor [atorvastatin calcium], Macrolides and ketolides, Nitrofurantoin monohyd macro, Penicillins, Percocet [oxycodone-acetaminophen], Percocet [oxycodone-acetaminophen], Sulfa antibiotics, Sulfa antibiotics, Tuna [fish allergy], and Wellbutrin [bupropion]   Review of Systems Review of Systems  Constitutional: Negative for activity change, appetite change, chills, fatigue and fever.  HENT: Positive for ear discharge and ear pain.  Negative for congestion, rhinorrhea, sinus pressure, sore throat and trouble swallowing.   Eyes: Negative for discharge and redness.  Respiratory: Negative for cough, chest tightness and shortness of breath.   Cardiovascular: Negative for chest pain.  Gastrointestinal: Negative for abdominal pain, diarrhea, nausea and vomiting.  Musculoskeletal: Negative for myalgias.  Skin: Negative for rash.  Neurological: Negative for dizziness, light-headedness and headaches.     Physical Exam Triage Vital Signs ED Triage Vitals  Enc Vitals Group     BP      Pulse      Resp  Temp      Temp src      SpO2      Weight      Height      Head Circumference      Peak Flow      Pain Score      Pain Loc      Pain Edu?      Excl. in [place]?    No data found.  Updated Vital Signs BP (!) 144/91 (BP Location: Left Arm)   Pulse 71   Temp 98 F (36.7 C) (Oral)   Resp 15   SpO2 97%   Visual Acuity Right Eye Distance:   Left Eye Distance:   Bilateral Distance:    Right Eye Near:   Left Eye Near:    Bilateral Near:     Physical Exam Vitals and nursing note reviewed.  Constitutional:      Appearance: She is well-developed.     Comments: No acute distress  HENT:     Head: Normocephalic and atraumatic.     Ears:     Comments: Right canal with macerated skin with associated bleeding, white cerumen noted blocking TM    Nose: Nose normal.     Mouth/Throat:     Comments: Oral mucosa pink and moist, no tonsillar enlargement or exudate. Posterior pharynx patent and nonerythematous, no uvula deviation or swelling. Normal phonation. Eyes:     Conjunctiva/sclera: Conjunctivae normal.  Cardiovascular:     Rate and Rhythm: Normal rate.  Pulmonary:     Effort: Pulmonary effort is normal. No respiratory distress.  Abdominal:     General: There is no distension.  Musculoskeletal:        General: Normal range of motion.     Cervical back: Neck supple.  Skin:    General: Skin is warm and dry.   Neurological:     Mental Status: She is alert and oriented to person, place, and time.      UC Treatments / Results  Labs (all labs ordered are listed, but only abnormal results are displayed) Labs Reviewed - No data to display  EKG   Radiology No results found.  Procedures Procedures (including critical care time)  Medications Ordered in UC Medications - No data to display  Initial Impression / Assessment and Plan / UC Course  I have reviewed the triage vital signs and the nursing notes.  Pertinent labs & imaging results that were available during my care of the patient were reviewed by me and considered in my medical decision making (see chart for details).     Treating for otitis externa and given cannot visualize TM opting to go ahead and place on oral to cover media as well with Omnicef, appears to have cerumen impaction on the side, but given pain and bleeding of canal likely would not tolerate irrigation at this time, advised patient to return for irrigation after improvement in pain with completion of antibiotic.  Discussed strict return precautions. Patient verbalized understanding and is agreeable with plan.  Final Clinical Impressions(s) / UC Diagnoses   Final diagnoses:  Other infective acute otitis externa of right ear     Discharge Instructions     Omnicef twice daily for 10 days Tylenol as needed for pain Keep area clean and dry Follow-up for recheck    ED Prescriptions    Medication Sig Dispense Auth. Provider   cefdinir (OMNICEF) 300 MG capsule Take 1 capsule (300 mg total) by mouth 2 (two) times  daily for 10 days. 20 capsule Christo Hain, [place] C, PA-C     PDMP not reviewed this encounter.   Jacobey Gura, [place] C, PA-C 11/12/20 1100

## 2020-11-16 ENCOUNTER — Ambulatory Visit
Admission: RE | Admit: 2020-11-16 | Discharge: 2020-11-16 | Disposition: A | Discharge status: Home / Self Care | Payer: Medicare Other | Source: Ambulatory Visit | Attending: Acute Care | Admitting: Acute Care

## 2020-11-16 ENCOUNTER — Ambulatory Visit
Admission: RE | Admit: 2020-11-16 | Discharge: 2020-11-16 | Disposition: A | Discharge status: Home / Self Care | Payer: Medicare Other | Source: Ambulatory Visit

## 2020-11-16 ENCOUNTER — Other Ambulatory Visit: Payer: Self-pay

## 2020-11-16 VITALS — BP 125/79 | HR 65 | Temp 98.0°F | Resp 18

## 2020-11-16 DIAGNOSIS — Z09 Encounter for follow-up examination after completed treatment for conditions other than malignant neoplasm: Secondary | ICD-10-CM

## 2020-11-16 DIAGNOSIS — H60391 Other infective otitis externa, right ear: Secondary | ICD-10-CM | POA: Diagnosis not present

## 2020-11-16 DIAGNOSIS — Z87891 Personal history of nicotine dependence: Secondary | ICD-10-CM

## 2020-11-16 NOTE — ED Provider Notes (Signed)
[name]    CSN: [id number] Arrival date & time: 11/16/20  1306      History   Chief Complaint Chief Complaint  Patient presents with  . Appointment    1300  . Ear Fullness    HPI Alison Friedman is a 75 y.o. female presenting for follow-up of ear infection.  Was seen here 5 days ago for otitis media/externa and started on Omnicef.  She is on day 5 of 10.  She reports improvement in hearing/pain, but return for recheck to ensure her ear is improving as she continues to have slight discomfort right below her ear.  HPI  Past Medical History:  Diagnosis Date  . Abnormal CT scan, chest    stable  . Allergy   . Anxiety   . Asthma   . Asthma   . Cervical disc disease   . Diabetes mellitus   . Diabetes mellitus without complication ([place])   . Discoid lupus   . Gallbladder polyp   . Hyperlipidemia   . Hypertension   . Lymphadenopathy   . Postmenopausal HRT (hormone replacement therapy)   . Pulmonary embolism ([place]) At age 56    With associated DVT  . Renal cyst   . Shingles 12/2010    Patient Active Problem List   Diagnosis Date Noted  . Stage 3a chronic kidney disease ([place]) 06/19/2019  . Statin intolerance 07/30/2018  . Right lower quadrant abdominal pain 08/01/2017  . Esophageal stricture 05/17/2017  . Gastroesophageal reflux disease without esophagitis 05/17/2017  . Hx of adenomatous colonic polyps 05/17/2017  . Irritable bowel syndrome with constipation 05/17/2017  . Hypertension 09/26/2016  . Class 1 obesity due to excess calories with serious comorbidity and body mass index (BMI) of 32.0 to 32.9 in adult 09/26/2016  . 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 Generalized anxiety disorder 05/29/2016  . Osteoporosis 10/14/2015  . Coronary artery calcification 07/01/2015  . Smoker 07/01/2015  . COPD, moderate ([place]) 07/01/2015  . Enlargement of lymph nodes 01/01/2014  . Cervical disc disease 05/21/2012  . Recurrent sinusitis 05/21/2012  .  Diabetes mellitus ([place]) 10/10/2011  . Allergic rhinitis 10/10/2011  . RAD (reactive airway disease) 10/10/2011  . Diabetes mellitus 08/22/2011  . Asthma 08/22/2011  . Anxiety   . Hyperlipidemia   . Hypertension     Past Surgical History:  Procedure Laterality Date  . APPENDECTOMY     child  . APPENDECTOMY    . BREAST SURGERY    . INTRACAPSULAR CATARACT EXTRACTION Right 09/14/2020  . LAPAROSCOPIC HELLER MYOTOMY    . NECK SURGERY     Disk and rod  . SPINE SURGERY  2004   c spine  . TONSILLECTOMY AND ADENOIDECTOMY    . TUBAL LIGATION      OB History    Gravida  2   Para  2   Term  0   Preterm  0   AB  0   Living        SAB  0   IAB  0   Ectopic  0   Multiple      Live Births               Home Medications    Prior to Admission medications   Medication Sig Start Date End Date Taking? Authorizing Provider  cefdinir (OMNICEF) 300 MG capsule Take 300 mg by mouth 2 (two) times daily.   Yes [provider]  albuterol (VENTOLIN HFA) 108 (90  Base) MCG/ACT inhaler INHALE 2 PUFFS BY MOUTH EVERY 4 HOURS AS NEEDED FOR WHEEZING OR  SHORTNESS  OF  BREATH 01/15/20   Jacelyn Pi, Lilia Argue, MD  ALPRAZolam Duanne Moron) 0.25 MG tablet Take 0.5-1 tablets (0.125-0.25 mg total) by mouth daily as needed for anxiety. 04/16/20   Daleen Squibb, MD  benzonatate (TESSALON) 100 MG capsule Take 1-2 capsules (100-200 mg total) by mouth 3 (three) times daily as needed for cough. 08/16/20   Just, Laurita Quint, FNP  cefdinir (OMNICEF) 300 MG capsule Take 1 capsule (300 mg total) by mouth 2 (two) times daily for 10 days. 11/12/20 11/22/20  Laqueta Bonaventura C, PA-C  Cholecalciferol (VITAMIN D) 125 MCG (5000 UT) CAPS Take 5,000 Units by mouth daily.     [provider]  Clobetasol Prop Emollient Base (CLOBETASOL PROPIONATE E) 0.05 % emollient cream Apple to affected genitalia region at bedtime once a week 04/16/20   Jacelyn Pi, Lilia Argue, MD  dicyclomine (BENTYL) 20 MG tablet Take  20 mg by mouth as needed. 04/08/20   [provider]  Evolocumab (REPATHA SURECLICK) 778 MG/ML SOAJ Inject 140 mg into the skin every 14 (fourteen) days. 6 pens Rx 04/08/20   Adrian Prows, MD  fluticasone [facility]) 50 MCG/ACT nasal spray USE TWO SPRAY(S) IN EACH NOSTRIL ONCE DAILY 01/15/20   Jacelyn Pi, Lilia Argue, MD  glucose blood [facility] VERIO) test strip Use as instructed to check blood sugar once a day at various times-Dx code E11.9 01/15/20   Jacelyn Pi, Lilia Argue, MD  metFORMIN (GLUCOPHAGE-XR) 500 MG 24 hr tablet Take 1 tablet (500 mg total) by mouth 2 (two) times daily. 04/25/20   Daleen Squibb, MD  telmisartan (MICARDIS) 40 MG tablet Take 1 tablet (40 mg total) by mouth daily. 01/15/20 04/16/20  Daleen Squibb, MD    Family History Family History  Problem Relation Age of Onset  . Osteoporosis Mother   . Osteoporosis Sister   . Breast cancer Daughter 58  . Dementia Maternal Grandmother   . Stroke Maternal Grandfather     Social History Social History   Tobacco Use  . Smoking status: Former Smoker    Packs/day: 0.25    Years: 35.00    Pack years: 8.75    Types: Cigarettes    Quit date: 2018    Years since quitting: 4.3  . Smokeless tobacco: Former Systems developer  . Tobacco comment: Encouraged to remain smoke free.  Vaping Use  . Vaping Use: Never used  Substance Use Topics  . Alcohol use: Not Currently    Alcohol/week: 0.0 standard drinks  . Drug use: No     Allergies   Nitrofurantoin monohyd macro, Penicillins, Shellfish allergy, Tuna [fish allergy], Clarithromycin, Crestor [rosuvastatin], Food, Iodinated diagnostic agents, Levofloxacin, Levofloxacin, Lipitor [atorvastatin calcium], Lipitor [atorvastatin calcium], Macrolides and ketolides, Nitrofurantoin monohyd macro, Penicillins, Percocet [oxycodone-acetaminophen], Percocet [oxycodone-acetaminophen], Sulfa antibiotics, Sulfa antibiotics, Tuna [fish allergy], and Wellbutrin [bupropion]   Review of  Systems Review of Systems  Constitutional: Negative for activity change, appetite change, chills, fatigue and fever.  HENT: Positive for congestion, ear pain and rhinorrhea. Negative for sinus pressure, sore throat and trouble swallowing.   Eyes: Negative for discharge and redness.  Respiratory: Positive for cough. Negative for chest tightness and shortness of breath.   Cardiovascular: Negative for chest pain.  Gastrointestinal: Negative for abdominal pain, diarrhea, nausea and vomiting.  Musculoskeletal: Negative for myalgias.  Skin: Negative for rash.  Neurological: Negative for dizziness, light-headedness and  headaches.     Physical Exam Triage Vital Signs ED Triage Vitals [11/16/20 1328]  Enc Vitals Group     BP 125/79     Pulse Rate 65     Resp 18     Temp 98 F (36.7 C)     Temp Source Oral     SpO2 95 %     Weight      Height      Head Circumference      Peak Flow      Pain Score 2     Pain Loc      Pain Edu?      Excl. in [place]?    No data found.  Updated Vital Signs BP 125/79 (BP Location: Left Arm)   Pulse 65   Temp 98 F (36.7 C) (Oral)   Resp 18   SpO2 95%   Visual Acuity Right Eye Distance:   Left Eye Distance:   Bilateral Distance:    Right Eye Near:   Left Eye Near:    Bilateral Near:     Physical Exam Vitals and nursing note reviewed.  Constitutional:      Appearance: She is well-developed.     Comments: No acute distress  HENT:     Head: Normocephalic and atraumatic.     Ears:     Comments: Right canal appears much improved from prior visit, continues to have white material blocking visualization of TM, after irrigation, TM was visualized and without erythema, appears slightly irregular towards edges, canal with small amount of white dry skin noted to anterior canal    Nose: Nose normal.  Eyes:     Conjunctiva/sclera: Conjunctivae normal.  Cardiovascular:     Rate and Rhythm: Normal rate.  Pulmonary:     Effort: Pulmonary effort is  normal. No respiratory distress.  Abdominal:     General: There is no distension.  Musculoskeletal:        General: Normal range of motion.     Cervical back: Neck supple.  Skin:    General: Skin is warm and dry.  Neurological:     Mental Status: She is alert and oriented to person, place, and time.      UC Treatments / Results  Labs (all labs ordered are listed, but only abnormal results are displayed) Labs Reviewed - No data to display  EKG   Radiology No results found.  Procedures Procedures (including critical care time)  Medications Ordered in UC Medications - No data to display  Initial Impression / Assessment and Plan / UC Course  I have reviewed the triage vital signs and the nursing notes.  Pertinent labs & imaging results that were available during my care of the patient were reviewed by me and considered in my medical decision making (see chart for details).     Otitis externa appears to be improving, slight amount of white material irrigated by nursing staff, will complete full course of antibiotics and continue to monitor for gradual resolution of symptoms.  Discussed strict return precautions. Patient verbalized understanding and is agreeable with plan.  Final Clinical Impressions(s) / UC Diagnoses   Final diagnoses:  Other infective acute otitis externa of right ear  Follow up   Discharge Instructions   None    ED Prescriptions    None     PDMP not reviewed this encounter.   Janith Lima, [place] 11/16/20 1627

## 2020-11-16 NOTE — ED Triage Notes (Signed)
Pt reports she was her right ear to be recheck and she has the fullness sensation x 1 week. Reports the hearing and and pain improved after taking Cefdinir day 5 of 10 days.

## 2020-11-17 ENCOUNTER — Inpatient Hospital Stay: Admission: RE | Admit: 2020-11-17 | Payer: Medicare Other | Source: Ambulatory Visit

## 2020-11-24 NOTE — Progress Notes (Signed)
Please call patient and let them  know their  low dose Ct was read as a  Lung RADS 2: nodules that are benign in appearance and behavior with a very low likelihood of becoming a clinically active cancer due to size or lack of growth. Recommendation per radiology is for a repeat LDCT in 12 months. .Please let them  know we will order and schedule their  annual screening scan for 11/2021. Please let them  know there was notation of CAD on their  scan.  Please remind the patient  that this is a non-gated exam therefore degree or severity of disease  cannot be determined. Please have them  follow up with their PCP regarding potential risk factor modification, dietary therapy or pharmacologic therapy if clinically indicated. Pt.  is  not currently on statin therapy. Please place order for annual  screening scan for  11/2021 and fax results to PCP. Thanks so much.  Langley Gauss, this patient has significant CAD. Please have patient follow up with their PCP about follow up as they feel is clinically indicated.

## 2020-11-25 ENCOUNTER — Other Ambulatory Visit: Payer: Self-pay | Admitting: *Deleted

## 2020-11-25 ENCOUNTER — Encounter: Payer: Self-pay | Admitting: *Deleted

## 2020-11-25 DIAGNOSIS — Z87891 Personal history of nicotine dependence: Secondary | ICD-10-CM

## 2020-12-08 DIAGNOSIS — E119 Type 2 diabetes mellitus without complications: Secondary | ICD-10-CM | POA: Diagnosis not present

## 2020-12-08 DIAGNOSIS — G8929 Other chronic pain: Secondary | ICD-10-CM | POA: Diagnosis not present

## 2020-12-08 DIAGNOSIS — I2510 Atherosclerotic heart disease of native coronary artery without angina pectoris: Secondary | ICD-10-CM | POA: Diagnosis not present

## 2020-12-08 DIAGNOSIS — M2550 Pain in unspecified joint: Secondary | ICD-10-CM | POA: Diagnosis not present

## 2021-02-23 ENCOUNTER — Ambulatory Visit
Admission: RE | Admit: 2021-02-23 | Discharge: 2021-02-23 | Disposition: A | Discharge status: Home / Self Care | Payer: Medicare Other | Source: Ambulatory Visit

## 2021-02-23 VITALS — BP 164/84 | HR 65 | Temp 98.1°F | Resp 18

## 2021-02-23 DIAGNOSIS — Z8719 Personal history of other diseases of the digestive system: Secondary | ICD-10-CM

## 2021-02-23 DIAGNOSIS — E119 Type 2 diabetes mellitus without complications: Secondary | ICD-10-CM

## 2021-02-23 DIAGNOSIS — R1011 Right upper quadrant pain: Secondary | ICD-10-CM | POA: Diagnosis not present

## 2021-02-23 LAB — POCT URINALYSIS DIP (MANUAL ENTRY)
Bilirubin, UA: NEGATIVE
Blood, UA: NEGATIVE
Glucose, UA: NEGATIVE mg/dL
Ketones, POC UA: NEGATIVE mg/dL
Nitrite, UA: NEGATIVE
Protein Ur, POC: NEGATIVE mg/dL
Spec Grav, UA: 1.02 (ref 1.010–1.025)
Urobilinogen, UA: 0.2 E.U./dL
pH, UA: 5.5 (ref 5.0–8.0)

## 2021-02-23 NOTE — ED Provider Notes (Signed)
[place]   MRN: [id number] DOB: October 09, 1945  Subjective:   Alison Friedman is a 75 y.o. female presenting for 5-day history of persistent and progressively worsening right upper quadrant abdominal pain that penetrates towards her back to the bottom of her shoulder blade.  She is also had nausea without vomiting, decreased appetite.  Denies fever, cough, chest pain, shortness of breath, diarrhea, constipation, bloody stools.  Patient does have a history of IBS, took her normal IBS medication and has not had any change in her status.  She does admit that this feels different from her typical IBS symptoms.  Last bowel movement was yesterday, had 1 today before as well.  She has gotten colonoscopies regularly at 5-year intervals any particular issues such as diverticulosis.  She has been found to have polyps but that is all.  No current facility-administered medications for this encounter.  Current Outpatient Medications:    albuterol (VENTOLIN HFA) 108 (90 Base) MCG/ACT inhaler, INHALE 2 PUFFS BY MOUTH EVERY 4 HOURS AS NEEDED FOR WHEEZING OR  SHORTNESS  OF  BREATH, Disp: 18 g, Rfl: 0   ALPRAZolam (XANAX) 0.25 MG tablet, Take 0.5-1 tablets (0.125-0.25 mg total) by mouth daily as needed for anxiety., Disp: 30 tablet, Rfl: 1   cefdinir (OMNICEF) 300 MG capsule, Take 300 mg by mouth 2 (two) times daily., Disp: , Rfl:    Cholecalciferol (VITAMIN D) 125 MCG (5000 UT) CAPS, Take 5,000 Units by mouth daily. , Disp: , Rfl:    Clobetasol Prop Emollient Base (CLOBETASOL PROPIONATE E) 0.05 % emollient cream, Apple to affected genitalia region at bedtime once a week, Disp: 30 g, Rfl: 0   dicyclomine (BENTYL) 20 MG tablet, Take 20 mg by mouth as needed., Disp: , Rfl:    Evolocumab (REPATHA SURECLICK) [id number] MG/ML SOAJ, Inject 140 mg into the skin every 14 (fourteen) days. 6 pens Rx, Disp: 6 mL, Rfl: 3   fluticasone (FLONASE) 50 MCG/ACT nasal spray, USE TWO SPRAY(S) IN EACH NOSTRIL ONCE DAILY, Disp: 16 g,  Rfl: 4   glucose blood (ONETOUCH VERIO) test strip, Use as instructed to check blood sugar once a day at various times-Dx code E11.9, Disp: 50 each, Rfl: 3   metFORMIN (GLUCOPHAGE-XR) 500 MG 24 hr tablet, Take 1 tablet (500 mg total) by mouth 2 (two) times daily., Disp: 180 tablet, Rfl: 1   omeprazole (PRILOSEC) 20 MG capsule, Take 20 mg by mouth daily., Disp: , Rfl:    telmisartan (MICARDIS) 40 MG tablet, Take 1 tablet (40 mg total) by mouth daily., Disp: 90 tablet, Rfl: 3   traMADol (ULTRAM) 50 MG tablet, Take 50 mg by mouth every 8 (eight) hours as needed., Disp: , Rfl:    Allergies  Allergen Reactions   Nitrofurantoin Monohyd Macro Anaphylaxis   Penicillins Anaphylaxis   Shellfish Allergy Anaphylaxis   Geralyn Flash [Fish Allergy] Anaphylaxis   Clarithromycin Other (See Comments)    Face turns red.   Crestor [Rosuvastatin]    Food     tuna   Iodinated Diagnostic Agents     Prev CT reports state anaphylaxis with IV contrast, pt also states severe reaction with contrast   Levofloxacin    Levofloxacin Other (See Comments)    Face turned red.   Lipitor [Atorvastatin Calcium]    Lipitor [Atorvastatin Calcium] Other (See Comments)    Muscle pain   Macrolides And Ketolides    Nitrofurantoin Monohyd Macro    Penicillins    Percocet [Oxycodone-Acetaminophen]    Percocet [Oxycodone-Acetaminophen]  Swelling   Sulfa Antibiotics    Sulfa Antibiotics Swelling   Tuna [Fish Allergy] Hives   Wellbutrin [Bupropion] Other (See Comments)    Made essential tremors worse    Past Medical History:  Diagnosis Date   Abnormal CT scan, chest    stable   Allergy    Anxiety    Asthma    Asthma    Cervical disc disease    Diabetes mellitus    Diabetes mellitus without complication (HCC)    Discoid lupus    Gallbladder polyp    Hyperlipidemia    Hypertension    Lymphadenopathy    Postmenopausal HRT (hormone replacement therapy)    Pulmonary embolism ([place]) At age 33    With associated DVT   Renal  cyst    Shingles 12/2010     Past Surgical History:  Procedure Laterality Date   APPENDECTOMY     child   APPENDECTOMY     BREAST SURGERY     INTRACAPSULAR CATARACT EXTRACTION Right 09/14/2020   LAPAROSCOPIC HELLER MYOTOMY     NECK SURGERY     Disk and rod   SPINE SURGERY  2004   c spine   TONSILLECTOMY AND ADENOIDECTOMY     TUBAL LIGATION      Family History  Problem Relation Age of Onset   Osteoporosis Mother    Osteoporosis Sister    Breast cancer Daughter 15   Dementia Maternal Grandmother    Stroke Maternal Grandfather     Social History   Tobacco Use   Smoking status: Former    Packs/day: 0.25    Years: 35.00    Pack years: 8.75    Types: Cigarettes    Quit date: 2018    Years since quitting: 4.6   Smokeless tobacco: Former   Tobacco comments:    Encouraged to remain smoke free.  Vaping Use   Vaping Use: Never used  Substance Use Topics   Alcohol use: Not Currently    Alcohol/week: 0.0 standard drinks   Drug use: No    ROS   Objective:   Vitals: BP (!) 164/84 (BP Location: Left Arm)   Pulse 65   Temp 98.1 F (36.7 C) (Oral)   Resp 18   SpO2 96%   Physical Exam Constitutional:      General: She is not in acute distress.    Appearance: Normal appearance. She is well-developed and normal weight. She is not ill-appearing, toxic-appearing or diaphoretic.  HENT:     Head: Normocephalic and atraumatic.     Right Ear: External ear normal.     Left Ear: External ear normal.     Nose: Nose normal.     Mouth/Throat:     Mouth: Mucous membranes are moist.     Pharynx: Oropharynx is clear.  Eyes:     General: No scleral icterus.    Extraocular Movements: Extraocular movements intact.     Pupils: Pupils are equal, round, and reactive to light.  Cardiovascular:     Rate and Rhythm: Normal rate and regular rhythm.     Heart sounds: Normal heart sounds. No murmur heard.   No friction rub. No gallop.  Pulmonary:     Effort: Pulmonary effort is  normal. No respiratory distress.     Breath sounds: Normal breath sounds. No stridor. No wheezing, rhonchi or rales.  Abdominal:     General: Bowel sounds are normal. There is no distension.     Palpations: Abdomen is soft.  There is no mass.     Tenderness: There is abdominal tenderness (RUQ). There is guarding. There is no right CVA tenderness, left CVA tenderness or rebound.  Skin:    General: Skin is warm and dry.     Coloration: Skin is not pale.     Findings: No rash.  Neurological:     General: No focal deficit present.     Mental Status: She is alert and oriented to person, place, and time.  Psychiatric:        Mood and Affect: Mood normal.        Behavior: Behavior normal.        Thought Content: Thought content normal.        Judgment: Judgment normal.    Results for orders placed or performed during the hospital encounter of 02/23/21 (from the past 24 hour(s))  POCT urinalysis dipstick     Status: Abnormal   Collection Time: 02/23/21  3:33 PM  Result Value Ref Range   Color, UA yellow yellow   Clarity, UA clear clear   Glucose, UA negative negative mg/dL   Bilirubin, UA negative negative   Ketones, POC UA negative negative mg/dL   Spec Grav, UA 1.020 1.010 - 1.025   Blood, UA negative negative   pH, UA 5.5 5.0 - 8.0   Protein Ur, POC negative negative mg/dL   Urobilinogen, UA 0.2 0.2 or 1.0 E.U./dL   Nitrite, UA Negative Negative   Leukocytes, UA Trace (A) Negative    Assessment and Plan :   PDMP not reviewed this encounter.  1. RUQ abdominal pain   2. History of IBS   3. Type 2 diabetes mellitus treated without insulin ([place])     Patient has moderate to severe constant right upper quadrant abdominal pain that penetrates posteriorly into her back.  Symptoms warrant further evaluation than we can provide in the urgent care setting and therefore recommended patient present to the emergency room for consideration of a CT abdomen pelvis.  Patient is hemodynamically  stable and therefore appropriate for transfer by personal vehicle.   Jaynee Eagles, PA-C 02/23/21 1551

## 2021-02-23 NOTE — Discharge Instructions (Addendum)
I recommend presenting to the emergency room for further evaluation and intervention of your severe abdominal pain. Unfortunately, I cannot obtain a CT abdomen/pelvis but this can be evaluated in the emergency room.

## 2021-02-23 NOTE — ED Triage Notes (Signed)
Pt c/o RUQ pain with nausea x5 days. States pain is worse after eating. States hx of IBS but feels different. States taken Bentyl without relief.

## 2021-03-02 ENCOUNTER — Other Ambulatory Visit: Payer: Self-pay

## 2021-03-02 ENCOUNTER — Ambulatory Visit (HOSPITAL_BASED_OUTPATIENT_CLINIC_OR_DEPARTMENT_OTHER): Payer: Medicare Other

## 2021-03-02 ENCOUNTER — Emergency Department (HOSPITAL_BASED_OUTPATIENT_CLINIC_OR_DEPARTMENT_OTHER): Payer: Medicare Other

## 2021-03-02 ENCOUNTER — Encounter (HOSPITAL_BASED_OUTPATIENT_CLINIC_OR_DEPARTMENT_OTHER): Payer: Self-pay | Admitting: *Deleted

## 2021-03-02 ENCOUNTER — Emergency Department (HOSPITAL_BASED_OUTPATIENT_CLINIC_OR_DEPARTMENT_OTHER)
Admission: EM | Admit: 2021-03-02 | Discharge: 2021-03-02 | Disposition: A | Discharge status: Home / Self Care | Payer: Medicare Other | Attending: Emergency Medicine | Admitting: Emergency Medicine

## 2021-03-02 DIAGNOSIS — Z7952 Long term (current) use of systemic steroids: Secondary | ICD-10-CM | POA: Insufficient documentation

## 2021-03-02 DIAGNOSIS — I10 Essential (primary) hypertension: Secondary | ICD-10-CM | POA: Insufficient documentation

## 2021-03-02 DIAGNOSIS — J45909 Unspecified asthma, uncomplicated: Secondary | ICD-10-CM | POA: Insufficient documentation

## 2021-03-02 DIAGNOSIS — Z79899 Other long term (current) drug therapy: Secondary | ICD-10-CM | POA: Insufficient documentation

## 2021-03-02 DIAGNOSIS — Z7984 Long term (current) use of oral hypoglycemic drugs: Secondary | ICD-10-CM | POA: Diagnosis not present

## 2021-03-02 DIAGNOSIS — R1011 Right upper quadrant pain: Secondary | ICD-10-CM | POA: Insufficient documentation

## 2021-03-02 DIAGNOSIS — R109 Unspecified abdominal pain: Secondary | ICD-10-CM | POA: Diagnosis not present

## 2021-03-02 DIAGNOSIS — Z87891 Personal history of nicotine dependence: Secondary | ICD-10-CM | POA: Insufficient documentation

## 2021-03-02 DIAGNOSIS — E119 Type 2 diabetes mellitus without complications: Secondary | ICD-10-CM | POA: Diagnosis not present

## 2021-03-02 LAB — URINALYSIS, ROUTINE W REFLEX MICROSCOPIC
Bilirubin Urine: NEGATIVE
Glucose, UA: NEGATIVE mg/dL
Hgb urine dipstick: NEGATIVE
Ketones, ur: NEGATIVE mg/dL
Nitrite: NEGATIVE
Protein, ur: NEGATIVE mg/dL
Specific Gravity, Urine: 1.011 (ref 1.005–1.030)
pH: 5.5 (ref 5.0–8.0)

## 2021-03-02 LAB — COMPREHENSIVE METABOLIC PANEL
ALT: 11 U/L (ref 0–44)
AST: 13 U/L — ABNORMAL LOW (ref 15–41)
Albumin: 4.1 g/dL (ref 3.5–5.0)
Alkaline Phosphatase: 92 U/L (ref 38–126)
Anion gap: 11 (ref 5–15)
BUN: 19 mg/dL (ref 8–23)
CO2: 24 mmol/L (ref 22–32)
Calcium: 10.1 mg/dL (ref 8.9–10.3)
Chloride: 100 mmol/L (ref 98–111)
Creatinine, Ser: 1.31 mg/dL — ABNORMAL HIGH (ref 0.44–1.00)
GFR, Estimated: 42 mL/min — ABNORMAL LOW (ref >60)
Glucose, Bld: 121 mg/dL — ABNORMAL HIGH (ref 70–99)
Potassium: 3.8 mmol/L (ref 3.5–5.1)
Sodium: 135 mmol/L (ref 135–145)
Total Bilirubin: 0.8 mg/dL (ref 0.3–1.2)
Total Protein: 6.9 g/dL (ref 6.5–8.1)

## 2021-03-02 LAB — CBC
HCT: 40.8 % (ref 36.0–46.0)
Hemoglobin: 13.4 g/dL (ref 12.0–15.0)
MCH: 30.1 pg (ref 26.0–34.0)
MCHC: 32.8 g/dL (ref 30.0–36.0)
MCV: 91.7 fL (ref 80.0–100.0)
Platelets: 389 10*3/uL (ref 150–400)
RBC: 4.45 MIL/uL (ref 3.87–5.11)
RDW: 13 % (ref 11.5–15.5)
WBC: 8.7 10*3/uL (ref 4.0–10.5)
nRBC: 0 % (ref 0.0–0.2)

## 2021-03-02 LAB — LIPASE, BLOOD: Lipase: 10 U/L — ABNORMAL LOW (ref 11–51)

## 2021-03-02 MED ORDER — DIPHENHYDRAMINE HCL 25 MG PO CAPS: 50.0000 mg | ORAL_CAPSULE | Freq: Once | ORAL | Status: DC

## 2021-03-02 MED ORDER — HYDROCORTISONE NA SUCCINATE PF 100 MG IJ SOLR
200.0000 mg | Freq: Once | INTRAMUSCULAR | Status: AC
  Administered 2021-03-02: 200 mg via INTRAVENOUS
  Filled 2021-03-02: qty 4

## 2021-03-02 MED ORDER — FENTANYL CITRATE (PF) 100 MCG/2ML IJ SOLN
50.0000 ug | Freq: Once | INTRAMUSCULAR | Status: AC
Start: 2021-03-02 — End: 2021-03-02
  Administered 2021-03-02: 50 ug via INTRAVENOUS
  Filled 2021-03-02: qty 2

## 2021-03-02 MED ORDER — DIPHENHYDRAMINE HCL 50 MG/ML IJ SOLN: 50.0000 mg | Freq: Once | INTRAMUSCULAR | Status: DC

## 2021-03-02 MED ORDER — ALPRAZOLAM 0.5 MG PO TABS
0.2500 mg | ORAL_TABLET | Freq: Once | ORAL | Status: AC
  Administered 2021-03-02: 0.25 mg via ORAL
  Filled 2021-03-02: qty 1

## 2021-03-02 MED ORDER — ONDANSETRON HCL 4 MG/2ML IJ SOLN
4.0000 mg | Freq: Once | INTRAMUSCULAR | Status: AC
  Administered 2021-03-02: 4 mg via INTRAVENOUS
  Filled 2021-03-02: qty 2

## 2021-03-02 NOTE — ED Provider Notes (Signed)
[facility] Provider Note  CSN: [id number] Arrival date & time: 03/02/21 [id number]    History No chief complaint on file.   Alison Friedman is a 75 y.o. female with prior history of IBS reports she has had RUQ pain for the last 2 weeks, described as a constant dull ache, worsened after eating. She saw UC 7 days ago who recommended she come to the ED for evaluation. She thought maybe it was just IBS/constipation so she went home and took some laxatives. She has had BM but no change in pain. She denies fever, has been nauseated. Lost 10lbs due to poor PO intake.    Past Medical History:  Diagnosis Date  . Abnormal CT scan, chest    stable  . Allergy   . Anxiety   . Asthma   . Asthma   . Cervical disc disease   . Diabetes mellitus   . Diabetes mellitus without complication ([place])   . Discoid lupus   . Gallbladder polyp   . Hyperlipidemia   . Hypertension   . Lymphadenopathy   . Postmenopausal HRT (hormone replacement therapy)   . Pulmonary embolism ([place]) At age 50    With associated DVT  . Renal cyst   . Shingles 12/2010    Past Surgical History:  Procedure Laterality Date  . APPENDECTOMY     child  . APPENDECTOMY    . BREAST SURGERY    . INTRACAPSULAR CATARACT EXTRACTION Right 09/14/2020  . LAPAROSCOPIC HELLER MYOTOMY    . NECK SURGERY     Disk and rod  . SPINE SURGERY  2004   c spine  . TONSILLECTOMY AND ADENOIDECTOMY    . TUBAL LIGATION      Family History  Problem Relation Age of Onset  . Osteoporosis Mother   . Osteoporosis Sister   . Breast cancer Daughter 31  . Dementia Maternal Grandmother   . Stroke Maternal Grandfather     Social History   Tobacco Use  . Smoking status: Former    Packs/day: 0.25    Years: 35.00    Pack years: 8.75    Types: Cigarettes    Quit date: 2018    Years since quitting: 4.6  . Smokeless tobacco: Former  . Tobacco comments:    Encouraged to remain smoke free.  Vaping Use  . Vaping Use:  Never used  Substance Use Topics  . Alcohol use: Not Currently    Alcohol/week: 0.0 standard drinks  . Drug use: No     Home Medications Prior to Admission medications   Medication Sig Start Date End Date Taking? Authorizing Provider  albuterol (VENTOLIN HFA) 108 (90 Base) MCG/ACT inhaler INHALE 2 PUFFS BY MOUTH EVERY 4 HOURS AS NEEDED FOR WHEEZING OR  SHORTNESS  OF  BREATH 01/15/20   Jacelyn Pi, Lilia Argue, MD  ALPRAZolam Duanne Moron) 0.25 MG tablet Take 0.5-1 tablets (0.125-0.25 mg total) by mouth daily as needed for anxiety. 04/16/20   Daleen Squibb, MD  cefdinir (OMNICEF) 300 MG capsule Take 300 mg by mouth 2 (two) times daily.    [provider]  Cholecalciferol (VITAMIN D) 125 MCG (5000 UT) CAPS Take 5,000 Units by mouth daily.     [provider]  Clobetasol Prop Emollient Base (CLOBETASOL PROPIONATE E) 0.05 % emollient cream Apple to affected genitalia region at bedtime once a week 04/16/20   Jacelyn Pi, Lilia Argue, MD  dicyclomine (BENTYL) 20 MG tablet Take 20 mg by mouth as  needed. 04/08/20   [provider]  Evolocumab (REPATHA SURECLICK) [id number] MG/ML SOAJ Inject 140 mg into the skin every 14 (fourteen) days. 6 pens Rx 04/08/20   Adrian Prows, MD  fluticasone [facility]) 50 MCG/ACT nasal spray USE TWO SPRAY(S) IN EACH NOSTRIL ONCE DAILY 01/15/20   Jacelyn Pi, Lilia Argue, MD  glucose blood [facility] VERIO) test strip Use as instructed to check blood sugar once a day at various times-Dx code E11.9 01/15/20   Jacelyn Pi, Lilia Argue, MD  metFORMIN (GLUCOPHAGE-XR) 500 MG 24 hr tablet Take 1 tablet (500 mg total) by mouth 2 (two) times daily. 04/25/20   Daleen Squibb, MD  omeprazole (PRILOSEC) 20 MG capsule Take 20 mg by mouth daily. 09/08/20   [provider]  telmisartan (MICARDIS) 40 MG tablet Take 1 tablet (40 mg total) by mouth daily. 01/15/20 04/16/20  Daleen Squibb, MD  traMADol (ULTRAM) 50 MG tablet Take 50 mg by mouth every 8 (eight) hours as needed.  12/08/20   [provider]     Allergies    Nitrofurantoin monohyd macro, Penicillins, Shellfish allergy, Tuna [fish allergy], Clarithromycin, Crestor [rosuvastatin], Food, Iodinated diagnostic agents, Levofloxacin, Levofloxacin, Lipitor [atorvastatin calcium], Lipitor [atorvastatin calcium], Macrolides and ketolides, Nitrofurantoin monohyd macro, Penicillins, Percocet [oxycodone-acetaminophen], Percocet [oxycodone-acetaminophen], Sulfa antibiotics, Sulfa antibiotics, Tuna [fish allergy], and Wellbutrin [bupropion]   Review of Systems   Review of Systems A comprehensive review of systems was completed and negative except as noted in HPI.    Physical Exam BP (!) 170/85 (BP Location: Right Arm)   Pulse 66   Temp 98.2 F (36.8 C) (Oral)   Resp 16   Ht '5\' 3"'$  (1.6 m)   Wt 76.7 kg   SpO2 100%   BMI 29.94 kg/m   Physical Exam Vitals and nursing note reviewed.  Constitutional:      Appearance: Normal appearance.  HENT:     Head: Normocephalic and atraumatic.     Nose: Nose normal.     Mouth/Throat:     Mouth: Mucous membranes are moist.  Eyes:     Extraocular Movements: Extraocular movements intact.     Conjunctiva/sclera: Conjunctivae normal.  Cardiovascular:     Rate and Rhythm: Normal rate.  Pulmonary:     Effort: Pulmonary effort is normal.     Breath sounds: Normal breath sounds.  Abdominal:     General: Abdomen is flat.     Palpations: Abdomen is soft.     Tenderness: There is abdominal tenderness in the right upper quadrant. There is guarding. Positive signs include Murphy's sign.  Musculoskeletal:        General: No swelling. Normal range of motion.     Cervical back: Neck supple.  Skin:    General: Skin is warm and dry.  Neurological:     General: No focal deficit present.     Mental Status: She is alert.  Psychiatric:        Mood and Affect: Mood normal.     ED Results / Procedures / Treatments   Labs (all labs ordered are listed, but only  abnormal results are displayed) Labs Reviewed  CBC  LIPASE, BLOOD  COMPREHENSIVE METABOLIC PANEL  URINALYSIS, ROUTINE W REFLEX MICROSCOPIC    EKG None   Radiology No results found.  Procedures Procedures  Medications Ordered in the ED Medications  fentaNYL (SUBLIMAZE) injection 50 mcg (has no administration in time range)  ondansetron (ZOFRAN) injection 4 mg (has no administration in time range)  MDM Rules/Calculators/A&P MDM Patient with RUQ, worse with eating, concerning for biliary source. Will check labs, send for [place] and give pain/nausea meds for comfort.   ED Course  I have reviewed the triage vital signs and the nursing notes.  Pertinent labs & imaging results that were available during my care of the patient were reviewed by me and considered in my medical decision making (see chart for details).  Clinical Course as of 03/03/21 0657  Wed Mar 02, 2021  1125 CBC is normal.  [CS]  1138 UA unremarkable.  [CS]  [id number] CMP, lipase are unremarkable.  [CS]  [id number] Patient reports pain improved after meds. [place] is neg for biliary disease. Will send for CTA to rule out mesenteric ischemia as the cause of her symptoms. She has a contrast allergy, discussed risk, benefit and alternatives and she is amenable to proceeding with CTA with pre-treatment.  [CS]  19 Spoke with Dr. Tery Sanfilippo, Radiology, who has reviewed some recent CT images and reports there is no signs of proximal vascular disease. He suggests a non-contrast CT first and the re-evaluate need for CTA. Care of the patient signed out to Dr. Fulton Reek at the change of shift.  [CS]    Clinical Course User Index [CS] Truddie Hidden, MD    Final Clinical Impression(s) / ED Diagnoses Final diagnoses:  RUQ abdominal pain    Rx / DC Orders ED Discharge Orders     None        Truddie Hidden, MD 03/03/21 [phone]

## 2021-03-02 NOTE — ED Provider Notes (Signed)
Patient signed out to me by previous provider. Please refer to their note for full HPI.  Briefly this is a 75 year old female who presented to the emergency department with abdominal pain for the past 2 weeks.  Blood work and ultrasound are reassuring, we are pending CAT scan of the abdomen pelvis for further evaluation. Physical Exam  BP 122/65 (BP Location: Right Arm)   Pulse 63   Temp 98.2 F (36.8 C) (Oral)   Resp 16   Ht '5\' 3"'$  (1.6 m)   Wt 76.7 kg   SpO2 97%   BMI 29.94 kg/m   Physical Exam Vitals and nursing note reviewed.  Constitutional:      Appearance: Normal appearance.  HENT:     Head: Normocephalic.     Mouth/Throat:     Mouth: Mucous membranes are moist.  Cardiovascular:     Rate and Rhythm: Normal rate.  Pulmonary:     Effort: Pulmonary effort is normal. No respiratory distress.  Abdominal:     Palpations: Abdomen is soft.     Tenderness: There is no abdominal tenderness. There is no guarding or rebound.  Skin:    General: Skin is warm.  Neurological:     Mental Status: She is alert and oriented to person, place, and time. Mental status is at baseline.  Psychiatric:        Mood and Affect: Mood normal.    ED Course/Procedures   Clinical Course as of 03/02/21 1754  Wed Mar 02, 2021  1125 CBC is normal.  [CS]  1138 UA unremarkable.  [CS]  [id number] CMP, lipase are unremarkable.  [CS]  [id number] Patient reports pain improved after meds. [place] is neg for biliary disease. Will send for CTA to rule out mesenteric ischemia as the cause of her symptoms. She has a contrast allergy, discussed risk, benefit and alternatives and she is amenable to proceeding with CTA with pre-treatment.  [CS]  43 Spoke with Dr. Tery Sanfilippo, Radiology, who has reviewed some recent CT images and reports there is no signs of proximal vascular disease. He suggests a non-contrast CT first and the re-evaluate need for CTA. Care of the patient signed out to Dr. Fulton Reek at the change of shift.  [CS]     Clinical Course User Index [CS] Truddie Hidden, MD    Procedures  MDM   CAT scan identifies no acute pathology.  Patient currently feels improved, abdominal exam continues to be benign for me.  Vitals are normal.  Low suspicion for acute/emergent etiology.  Plan for outpatient follow-up with gastroenterology with strict return to ED precautions.  Patient at this time appears safe and stable for discharge and will be treated as an outpatient.  Discharge plan and strict return to ED precautions discussed, patient verbalizes understanding and agreement.       Lorelle Gibbs, DO 03/02/21 1755

## 2021-03-02 NOTE — Discharge Instructions (Addendum)
You have been seen and discharged from the emergency department.  Your blood work, ultrasound and CAT scan showed no acute finding.  You need to follow-up with gastroenterology for more in-depth evaluation/treatment.  Follow-up with your primary provider for reevaluation and further care. Take home medications as prescribed. If you have any worsening symptoms or further concerns for your health please return to an emergency department for further evaluation.

## 2021-03-02 NOTE — ED Triage Notes (Signed)
Pt has had abd  pain for 2 weeks, Went to UC on 8/10, was told to f/u in ED for Ct Abd/Pelvis.Pain continues. Nausea without vomiting, no diarrhea.

## 2021-03-10 DIAGNOSIS — E119 Type 2 diabetes mellitus without complications: Secondary | ICD-10-CM | POA: Diagnosis not present

## 2021-03-10 DIAGNOSIS — E785 Hyperlipidemia, unspecified: Secondary | ICD-10-CM | POA: Diagnosis not present

## 2021-03-10 DIAGNOSIS — M545 Low back pain, unspecified: Secondary | ICD-10-CM | POA: Diagnosis not present

## 2021-03-10 DIAGNOSIS — G8929 Other chronic pain: Secondary | ICD-10-CM | POA: Diagnosis not present

## 2021-04-11 DIAGNOSIS — Z803 Family history of malignant neoplasm of breast: Secondary | ICD-10-CM | POA: Diagnosis not present

## 2021-04-11 DIAGNOSIS — Z1231 Encounter for screening mammogram for malignant neoplasm of breast: Secondary | ICD-10-CM | POA: Diagnosis not present

## 2021-04-26 DIAGNOSIS — H2512 Age-related nuclear cataract, left eye: Secondary | ICD-10-CM | POA: Diagnosis not present

## 2021-04-26 DIAGNOSIS — H25042 Posterior subcapsular polar age-related cataract, left eye: Secondary | ICD-10-CM | POA: Diagnosis not present

## 2021-04-26 DIAGNOSIS — H25012 Cortical age-related cataract, left eye: Secondary | ICD-10-CM | POA: Diagnosis not present

## 2021-04-26 DIAGNOSIS — H25812 Combined forms of age-related cataract, left eye: Secondary | ICD-10-CM | POA: Diagnosis not present

## 2021-06-14 DIAGNOSIS — N189 Chronic kidney disease, unspecified: Secondary | ICD-10-CM | POA: Diagnosis not present

## 2021-06-14 DIAGNOSIS — R799 Abnormal finding of blood chemistry, unspecified: Secondary | ICD-10-CM | POA: Diagnosis not present

## 2021-06-14 DIAGNOSIS — E1122 Type 2 diabetes mellitus with diabetic chronic kidney disease: Secondary | ICD-10-CM | POA: Diagnosis not present

## 2021-06-14 DIAGNOSIS — K2100 Gastro-esophageal reflux disease with esophagitis, without bleeding: Secondary | ICD-10-CM | POA: Diagnosis not present

## 2021-11-16 ENCOUNTER — Ambulatory Visit
Admission: RE | Admit: 2021-11-16 | Discharge: 2021-11-16 | Disposition: A | Discharge status: Home / Self Care | Payer: Medicare Other | Source: Ambulatory Visit | Attending: Acute Care | Admitting: Acute Care

## 2021-11-16 DIAGNOSIS — Z87891 Personal history of nicotine dependence: Secondary | ICD-10-CM

## 2021-11-23 ENCOUNTER — Other Ambulatory Visit: Payer: Self-pay

## 2021-11-23 DIAGNOSIS — Z87891 Personal history of nicotine dependence: Secondary | ICD-10-CM

## 2021-11-23 DIAGNOSIS — Z122 Encounter for screening for malignant neoplasm of respiratory organs: Secondary | ICD-10-CM

## 2022-01-27 ENCOUNTER — Ambulatory Visit: Payer: Medicare Other | Admitting: Cardiology

## 2022-01-27 ENCOUNTER — Encounter: Payer: Self-pay | Admitting: Cardiology

## 2022-01-27 VITALS — BP 135/71 | HR 67 | Temp 97.3°F | Resp 16 | Ht 63.0 in | Wt 173.8 lb

## 2022-01-27 DIAGNOSIS — E1122 Type 2 diabetes mellitus with diabetic chronic kidney disease: Secondary | ICD-10-CM

## 2022-01-27 DIAGNOSIS — E782 Mixed hyperlipidemia: Secondary | ICD-10-CM

## 2022-01-27 DIAGNOSIS — I10 Essential (primary) hypertension: Secondary | ICD-10-CM

## 2022-01-27 DIAGNOSIS — E559 Vitamin D deficiency, unspecified: Secondary | ICD-10-CM

## 2022-01-27 DIAGNOSIS — I2510 Atherosclerotic heart disease of native coronary artery without angina pectoris: Secondary | ICD-10-CM

## 2022-01-27 MED ORDER — EZETIMIBE 10 MG PO TABS: 10.0000 mg | ORAL_TABLET | Freq: Every day | ORAL | 0 refills | Status: DC

## 2022-01-27 MED ORDER — ROSUVASTATIN CALCIUM 5 MG PO TABS: 5.0000 mg | ORAL_TABLET | Freq: Every day | ORAL | 0 refills | Status: DC

## 2022-01-27 NOTE — Progress Notes (Signed)
Primary Physician/Referring:  Tereasa Coop, PA-C  Patient ID: Alison Friedman, female    DOB: 1945-11-16, 76 y.o.   MRN: [id number]  Chief Complaint  Patient presents with   Blockage   Medication Management   HPI:    Alison Friedman  is a 76 y.o. Caucasian female with severe statin intolerance and hyperlipidemia, DM Controlled and hypertension, mild coronary atheresclerosis (mid LAD 30%). She has not tolerated several statins and other lipid lowering agents in the past.  She is tolerating Repatha without any side effects.  However 5 months ago she discontinued the medication as she was trying to balance her budget between [place] and [place].  She got concerned and wanted to be evaluated further if there is any arthritis.  She was started on 5 mg of Crestor however due to LDL not being at goal she was advised to go to 20 mg daily.  She has been doing this with the fear of underlying coronary artery disease but has severe myalgias and arthralgias and literally cries on a daily basis but still continues to take the medication in spite of this.  She wanted to be further evaluated and makes visit to discuss options.  Otherwise she has not had any chest pain or dyspnea.  She has been active and has been walking and exercising on a daily basis.  Past Medical History:  Diagnosis Date   Abnormal CT scan, chest    stable   Allergy    Anxiety    Asthma    Asthma    Cervical disc disease    Diabetes mellitus    Diabetes mellitus without complication (HCC)    Discoid lupus    Gallbladder polyp    Hyperlipidemia    Hypertension    Lymphadenopathy    Postmenopausal HRT (hormone replacement therapy)    Pulmonary embolism ([place]) At age 63    With associated DVT   Renal cyst    Shingles 12/2010   Past Surgical History:  Procedure Laterality Date   APPENDECTOMY     child   APPENDECTOMY     BREAST SURGERY     INTRACAPSULAR CATARACT EXTRACTION Right 09/14/2020   LAPAROSCOPIC HELLER MYOTOMY      NECK SURGERY     Disk and rod   SPINE SURGERY  2004   c spine   TONSILLECTOMY AND ADENOIDECTOMY     TUBAL LIGATION     Social History   Tobacco Use   Smoking status: Former    Packs/day: 0.25    Years: 35.00    Total pack years: 8.75    Types: Cigarettes    Quit date: 2018    Years since quitting: 5.5   Smokeless tobacco: Former   Tobacco comments:    Encouraged to remain smoke free.  Substance Use Topics   Alcohol use: Not Currently    Alcohol/week: 0.0 standard drinks of alcohol   Marital Status: Married   ROS  Review of Systems  Cardiovascular:  Negative for chest pain, dyspnea on exertion and leg swelling.  Musculoskeletal:  Positive for joint pain (bilateral knee).  Gastrointestinal:  Negative for melena.   Objective      01/27/2022    2:47 PM 01/27/2022    1:54 PM 03/02/2021    5:53 PM  Vitals with BMI  Height  '5\' 3"'    Weight  173 lbs 13 oz   BMI  84.16   Systolic 606 301 601  Diastolic 71 75 65  Pulse  67 63    Blood pressure 135/71, pulse 67, temperature (!) 97.3 F (36.3 C), temperature source Temporal, resp. rate 16, height '5\' 3"'  (1.6 m), weight 173 lb 12.8 oz (78.8 kg), SpO2 96 %. Body mass index is 30.79 kg/m.   Physical Exam Constitutional:      Appearance: She is obese.  Cardiovascular:     Rate and Rhythm: Normal rate and regular rhythm.     Pulses: Intact distal pulses.     Heart sounds: Normal heart sounds. No murmur heard.    No gallop.     Comments: No leg edema, no JVD. Pulmonary:     Effort: Pulmonary effort is normal.     Breath sounds: Normal breath sounds.  Abdominal:     General: Bowel sounds are normal.     Palpations: Abdomen is soft.    Radiology: No results found.  Laboratory examination:   Recent Labs    03/02/21 1041  NA 135  K 3.8  CL 100  CO2 24  GLUCOSE 121*  BUN 19  CREATININE 1.31*  CALCIUM 10.1  GFRNONAA 42*      Latest Ref Rng & Units 03/02/2021   10:41 AM 04/16/2020    3:24 PM 01/15/2020     2:55 PM  CMP  Glucose 70 - 99 mg/dL 121  116  117   BUN 8 - 23 mg/dL 19  30  33   Creatinine 0.44 - 1.00 mg/dL 1.31  1.53  1.10   Sodium 135 - 145 mmol/L 135  135  138   Potassium 3.5 - 5.1 mmol/L 3.8  5.2  4.8   Chloride 98 - 111 mmol/L 100  102  106   CO2 22 - 32 mmol/L '24  22  19   ' Calcium 8.9 - 10.3 mg/dL 10.1  9.6  9.5   Total Protein 6.5 - 8.1 g/dL 6.9  6.3    Total Bilirubin 0.3 - 1.2 mg/dL 0.8  0.7    Alkaline Phos 38 - 126 U/L 92  107    AST 15 - 41 U/L 13  13    ALT 0 - 44 U/L 11  12        Latest Ref Rng & Units 03/02/2021   10:41 AM 10/16/2019    2:40 PM 10/22/2017    4:31 PM  CBC  WBC 4.0 - 10.5 K/uL 8.7  6.0  9.5    9.5   Hemoglobin 12.0 - 15.0 g/dL 13.4  12.8  13.1    13.1   Hematocrit 36.0 - 46.0 % 40.8  38.7  39.0    39.0   Platelets 150 - 400 K/uL 389  410  423    423     TSH No results for input(s): "TSH" in the last 8760 hours.  External labs:  Labs 08/30/2021:  Total cholesterol 152, triglycerides 158, HDL 39, LDL 81.  Labs 03/10/2021:  A1c 7.1%.  BUN 24, creatinine 1.3, EGFR 43 mL, potassium 4.8  Medications   Allergies  Allergen Reactions   Nitrofurantoin Monohyd Macro Anaphylaxis   Penicillins Anaphylaxis   Shellfish Allergy Anaphylaxis   Geralyn Flash [Fish Allergy] Anaphylaxis   Clarithromycin Other (See Comments)    Face turns red.   Crestor [Rosuvastatin]    Food     tuna   Iodinated Contrast Media     Prev CT reports state anaphylaxis with IV contrast, pt also states severe reaction with contrast   Levofloxacin    Levofloxacin Other (See  Comments)    Face turned red.   Lipitor [Atorvastatin Calcium]    Lipitor [Atorvastatin Calcium] Other (See Comments)    Muscle pain   Macrolides And Ketolides    Nitrofurantoin Monohyd Macro    Penicillins    Percocet [Oxycodone-Acetaminophen]    Percocet [Oxycodone-Acetaminophen] Swelling   Sulfa Antibiotics    Sulfa Antibiotics Swelling   Tuna [Fish Allergy] Hives   Wellbutrin [Bupropion]  Other (See Comments)    Made essential tremors worse     Current Outpatient Medications:    albuterol (VENTOLIN HFA) 108 (90 Base) MCG/ACT inhaler, INHALE 2 PUFFS BY MOUTH EVERY 4 HOURS AS NEEDED FOR WHEEZING OR  SHORTNESS  OF  BREATH, Disp: 18 g, Rfl: 0   ALPRAZolam (XANAX) 0.25 MG tablet, Take 0.5-1 tablets (0.125-0.25 mg total) by mouth daily as needed for anxiety., Disp: 30 tablet, Rfl: 1   Cholecalciferol (VITAMIN D) 125 MCG (5000 UT) CAPS, Take 5,000 Units by mouth daily. , Disp: , Rfl:    Clobetasol Prop Emollient Base (CLOBETASOL PROPIONATE E) 0.05 % emollient cream, Apple to affected genitalia region at bedtime once a week, Disp: 30 g, Rfl: 0   dicyclomine (BENTYL) 20 MG tablet, Take 20 mg by mouth as needed., Disp: , Rfl:    Dulaglutide (TRULICITY) 5.36 UY/4.0HK SOPN, Inject 0.75 mg into the skin once a week., Disp: , Rfl:    ezetimibe (ZETIA) 10 MG tablet, Take 1 tablet (10 mg total) by mouth daily after supper., Disp: 90 tablet, Rfl: 0   fluticasone (FLONASE) 50 MCG/ACT nasal spray, USE TWO SPRAY(S) IN EACH NOSTRIL ONCE DAILY, Disp: 16 g, Rfl: 4   glucose blood (ONETOUCH VERIO) test strip, Use as instructed to check blood sugar once a day at various times-Dx code E11.9, Disp: 50 each, Rfl: 3   metFORMIN (GLUCOPHAGE-XR) 500 MG 24 hr tablet, Take 1 tablet (500 mg total) by mouth 2 (two) times daily. (Patient taking differently: Take 500 mg by mouth daily with breakfast.), Disp: 180 tablet, Rfl: 1   omeprazole (PRILOSEC) 20 MG capsule, Take 20 mg by mouth daily., Disp: , Rfl:    telmisartan (MICARDIS) 40 MG tablet, Take 1 tablet (40 mg total) by mouth daily., Disp: 90 tablet, Rfl: 3   traMADol (ULTRAM) 50 MG tablet, Take 50 mg by mouth every 8 (eight) hours as needed., Disp: , Rfl:    rosuvastatin (CRESTOR) 5 MG tablet, Take 1 tablet (5 mg total) by mouth daily., Disp: 90 tablet, Rfl: 0    Cardiac Studies:   Exercise Myoview stress test 11/21/2013: 1. Resting EKG showed normal sinus  rhythm, poor R wave progression, low voltage complexes. Stress EKG was negative for ischemia.  Patient exercised on BRUCE PROTOCOL for 4 minutes 34 seconds. The maximum work level achieved was 7.3 MET's. Hypertensive BP response. The baseline blood pressure was 140/90 mmHg and 208/102 mmHg with exercise. The test was terminated due to achievement of the target heart rate, fatigue and dizziness. 2. Perfusion imaging study demonstrates normal perfusion without ischemia or scar. Normal left ventricular ejection fraction at 69% Impression: EKG 04/08/2018: Normal sinus rhythm at rate of 74 bpm, normal axis, right bundle branch block. No evidence of ischemia. [facility] EKG 05/30/2017.  Coronary angiogram 07/20/2005: LAD had a 25% stenosis, otherwise normal coronary arteries. Ejection fraction was normal.  EKG   EKG 01/27/2022: Normal sinus rhythm at rate of 60 bpm, left atrial enlargement, right bundle branch block.  Low-voltage complexes.  No significant change from 04/08/2020.  Assessment  ICD-10-CM   1. Coronary atherosclerosis due to lipid rich plaque  I25.10 EKG 12-Lead   I25.83 rosuvastatin (CRESTOR) 5 MG tablet    ezetimibe (ZETIA) 10 MG tablet    2. Mixed hyperlipidemia  E78.2 rosuvastatin (CRESTOR) 5 MG tablet    ezetimibe (ZETIA) 10 MG tablet    Lipid Panel With LDL/HDL Ratio    Lipid Panel With LDL/HDL Ratio    3. Essential hypertension  I10     4. Controlled type 2 diabetes mellitus with stage 3 chronic kidney disease, without long-term current use of insulin (HCC)  E11.22    N18.30     5. Hypovitaminosis D  E55.9 VITAMIN D 25 Hydroxy (Vit-D Deficiency, Fractures)    VITAMIN D 25 Hydroxy (Vit-D Deficiency, Fractures)      Medications Discontinued During This Encounter  Medication Reason   Evolocumab (REPATHA SURECLICK) 257 MG/ML SOAJ    rosuvastatin (CRESTOR) 20 MG tablet Reorder     Meds ordered this encounter  Medications   rosuvastatin (CRESTOR) 5 MG tablet    Sig: Take 1  tablet (5 mg total) by mouth daily.    Dispense:  90 tablet    Refill:  0   ezetimibe (ZETIA) 10 MG tablet    Sig: Take 1 tablet (10 mg total) by mouth daily after supper.    Dispense:  90 tablet    Refill:  0    Recommendations:   Shary Lamos  is a 76 y.o. Caucasian female with severe statin intolerance and hyperlipidemia, DM Controlled and hypertension, mild coronary atheresclerosis (mid LAD 30%). She has not tolerated several statins and other lipid lowering agents in the past.  She is tolerating Repatha without any side effects.  However 5 months ago she discontinued the medication as she was trying to balance her budget between [place] and [place].  She got concerned and wanted to be evaluated further if there is any arthritis.  In the interim she was switched over to 5 mg of Crestor and repeat labs had revealed elevated LDL >70.  I reviewed the labs, it appears that she has really responded well to minimal dose of Crestor.  She is presently taking 20 mg with severe myalgias and literally cries on a daily basis due to pain but has been taking it in view of underlying coronary atherosclerosis.  I reduce the dose back to 5 mg and added Zetia 10 mg daily and I would like to repeat lipids in 8 weeks and see her back in 10 weeks following the labs and then will decide whether Leqvio would be appropriate for her.  Otherwise she is stable from cardiac standpoint, diabetes is well controlled, she has not had any angina pectoris and continues to remain active.  Since last office visit she has lost about 10 pounds in weight.  This was 1.5 years ago.  Her blood pressure was markedly elevated upon arrival as she was extremely anxious which improved upon rest.    Adrian Prows, MD, [facility] 01/27/2022, 2:48 PM Office: [phone]

## 2022-03-07 ENCOUNTER — Emergency Department (HOSPITAL_COMMUNITY): Payer: Medicare Other

## 2022-03-07 ENCOUNTER — Emergency Department (HOSPITAL_COMMUNITY)
Admission: EM | Admit: 2022-03-07 | Discharge: 2022-03-07 | Disposition: A | Discharge status: Home / Self Care | Payer: Medicare Other | Attending: Emergency Medicine | Admitting: Emergency Medicine

## 2022-03-07 ENCOUNTER — Other Ambulatory Visit: Payer: Self-pay

## 2022-03-07 ENCOUNTER — Encounter (HOSPITAL_COMMUNITY): Payer: Self-pay

## 2022-03-07 DIAGNOSIS — M79622 Pain in left upper arm: Secondary | ICD-10-CM | POA: Insufficient documentation

## 2022-03-07 DIAGNOSIS — S42202A Unspecified fracture of upper end of left humerus, initial encounter for closed fracture: Secondary | ICD-10-CM | POA: Insufficient documentation

## 2022-03-07 DIAGNOSIS — S4992XA Unspecified injury of left shoulder and upper arm, initial encounter: Secondary | ICD-10-CM | POA: Diagnosis present

## 2022-03-07 DIAGNOSIS — R519 Headache, unspecified: Secondary | ICD-10-CM | POA: Diagnosis not present

## 2022-03-07 DIAGNOSIS — W010XXA Fall on same level from slipping, tripping and stumbling without subsequent striking against object, initial encounter: Secondary | ICD-10-CM | POA: Diagnosis not present

## 2022-03-07 MED ORDER — HYDROMORPHONE HCL 2 MG/ML IJ SOLN
1.0000 mg | Freq: Once | INTRAMUSCULAR | Status: AC
  Administered 2022-03-07: 1 mg via INTRAVENOUS
  Filled 2022-03-07: qty 1

## 2022-03-07 MED ORDER — OXYCODONE HCL 5 MG PO TABS: 5.0000 mg | ORAL_TABLET | Freq: Four times a day (QID) | ORAL | 0 refills | Status: DC | PRN

## 2022-03-07 MED ORDER — MORPHINE SULFATE (PF) 4 MG/ML IV SOLN
4.0000 mg | Freq: Once | INTRAVENOUS | Status: AC
  Administered 2022-03-07: 4 mg via INTRAVENOUS
  Filled 2022-03-07: qty 1

## 2022-03-07 MED ORDER — DOCUSATE SODIUM 100 MG PO CAPS: 100.0000 mg | ORAL_CAPSULE | Freq: Two times a day (BID) | ORAL | 0 refills | Status: DC

## 2022-03-07 NOTE — ED Triage Notes (Signed)
Patient BIBA from home c/o left ar pain. Patient stated she had unwitnessed fall this morning at 0900. Patient denies hitting head and is not on blood thinners. Patient received 200 mcg of fentanyl via EMS.

## 2022-03-07 NOTE — Discharge Instructions (Signed)
Return for any problem.    Follow-up with your already established orthopedist at [facility].

## 2022-03-07 NOTE — ED Provider Notes (Signed)
[facility] DEPT Provider Note   CSN: [id number] Arrival date & time: 03/07/22  1246     History  Chief Complaint  Patient presents with   Arm Injury    Alison Friedman is a 76 y.o. female.  76 year old female with prior medical history as detailed below presents for evaluation.  Patient reports that she was at home and tripped over her flip-flops.  She landed hard on her left shoulder.  She complains of pain to the left upper arm.  She did not think that she struck her head but she has a bruise to her forehead.  This apparently is new.  She does not take anticoagulation.  She reports improvement in her pain after administration of 200 mcg of fentanyl with EMS.  She denies other injury besides her left upper arm.  The history is provided by the patient and medical records.  Arm Injury Location:  Shoulder Shoulder location:  L shoulder Pain details:    Quality:  Aching   Radiates to:  L shoulder and L upper arm   Severity:  Mild   Onset quality:  Sudden   Duration:  1 day      Home Medications Prior to Admission medications   Medication Sig Start Date End Date Taking? Authorizing Provider  albuterol (VENTOLIN HFA) 108 (90 Base) MCG/ACT inhaler INHALE 2 PUFFS BY MOUTH EVERY 4 HOURS AS NEEDED FOR WHEEZING OR  SHORTNESS  OF  BREATH 01/15/20   Jacelyn Pi, Lilia Argue, MD  ALPRAZolam Duanne Moron) 0.25 MG tablet Take 0.5-1 tablets (0.125-0.25 mg total) by mouth daily as needed for anxiety. 04/16/20   Daleen Squibb, MD  Cholecalciferol (VITAMIN D) 125 MCG (5000 UT) CAPS Take 5,000 Units by mouth daily.     [provider]  Clobetasol Prop Emollient Base (CLOBETASOL PROPIONATE E) 0.05 % emollient cream Apple to affected genitalia region at bedtime once a week 04/16/20   Jacelyn Pi, Lilia Argue, MD  dicyclomine (BENTYL) 20 MG tablet Take 20 mg by mouth as needed. 04/08/20   [provider]  Dulaglutide (TRULICITY) 7.90 WI/0.9BD SOPN Inject 0.75 mg  into the skin once a week. 01/04/22   [provider]  ezetimibe (ZETIA) 10 MG tablet Take 1 tablet (10 mg total) by mouth daily after supper. 01/27/22 04/27/22  Adrian Prows, MD  fluticasone [place]) 50 MCG/ACT nasal spray USE TWO SPRAY(S) IN EACH NOSTRIL ONCE DAILY 01/15/20   Jacelyn Pi, Lilia Argue, MD  glucose blood [facility] VERIO) test strip Use as instructed to check blood sugar once a day at various times-Dx code E11.9 01/15/20   Jacelyn Pi, Lilia Argue, MD  metFORMIN (GLUCOPHAGE-XR) 500 MG 24 hr tablet Take 1 tablet (500 mg total) by mouth 2 (two) times daily. Patient taking differently: Take 500 mg by mouth daily with breakfast. 04/25/20   Jacelyn Pi, Lilia Argue, MD  omeprazole (PRILOSEC) 20 MG capsule Take 20 mg by mouth daily. 09/08/20   [provider]  rosuvastatin (CRESTOR) 5 MG tablet Take 1 tablet (5 mg total) by mouth daily. 01/27/22   Adrian Prows, MD  telmisartan (MICARDIS) 40 MG tablet Take 1 tablet (40 mg total) by mouth daily. 01/15/20 01/27/22  Daleen Squibb, MD  traMADol (ULTRAM) 50 MG tablet Take 50 mg by mouth every 8 (eight) hours as needed. 12/08/20   [provider]      Allergies    Nitrofurantoin monohyd macro, Penicillins, Shellfish allergy, Tuna [fish allergy], Clarithromycin, Crestor [rosuvastatin],  Food, Iodinated contrast media, Levofloxacin, Levofloxacin, Lipitor [atorvastatin calcium], Lipitor [atorvastatin calcium], Macrolides and ketolides, Nitrofurantoin monohyd macro, Penicillins, Percocet [oxycodone-acetaminophen], Percocet [oxycodone-acetaminophen], Sulfa antibiotics, Sulfa antibiotics, Tuna [fish allergy], and Wellbutrin [bupropion]    Review of Systems   Review of Systems  All other systems reviewed and are negative.   Physical Exam Updated Vital Signs BP (!) 145/61 (BP Location: Right Arm)   Pulse 65   Temp 98.3 F (36.8 C) (Oral)   Resp 16   Ht '5\' 2"'$  (1.575 m)   Wt 76.8 kg   SpO2 97%   BMI 30.98 kg/m  Physical  Exam Vitals and nursing note reviewed.  Constitutional:      General: She is not in acute distress.    Appearance: Normal appearance. She is well-developed.  HENT:     Head: Normocephalic and atraumatic.  Eyes:     Conjunctiva/sclera: Conjunctivae normal.     Pupils: Pupils are equal, round, and reactive to light.  Cardiovascular:     Rate and Rhythm: Normal rate and regular rhythm.     Heart sounds: Normal heart sounds.  Pulmonary:     Effort: Pulmonary effort is normal. No respiratory distress.     Breath sounds: Normal breath sounds.  Abdominal:     General: There is no distension.     Palpations: Abdomen is soft.     Tenderness: There is no abdominal tenderness.  Musculoskeletal:        General: Tenderness present. No deformity. Normal range of motion.     Cervical back: Normal range of motion and neck supple.     Comments: Tenderness with palpation along the lateral aspect of the left upper arm.  No tenderness to the left elbow.  Distal left upper extremity is neurovascular intact.  Skin:    General: Skin is warm and dry.  Neurological:     General: No focal deficit present.     Mental Status: She is alert and oriented to person, place, and time.     ED Results / Procedures / Treatments   Labs (all labs ordered are listed, but only abnormal results are displayed) Labs Reviewed - No data to display  EKG None  Radiology CT Head Wo Contrast  Result Date: 03/07/2022 CLINICAL DATA:  Head trauma, minor (Age >= 65y) EXAM: CT HEAD WITHOUT CONTRAST TECHNIQUE: Contiguous axial images were obtained from the base of the skull through the vertex without intravenous contrast. RADIATION DOSE REDUCTION: This exam was performed according to the departmental dose-optimization program which includes automated exposure control, adjustment of the mA and/or kV according to patient size and/or use of iterative reconstruction technique. COMPARISON:  None Available. FINDINGS: Brain: There is  no acute intracranial hemorrhage, mass effect, or edema. Gray-white differentiation is preserved. There is no extra-axial fluid collection. Ventricles and sulci are within normal limits in size and configuration. Patchy hypoattenuation in the supratentorial white matter is nonspecific but may reflect mild chronic microvascular ischemic changes. Possible small age-indeterminate right cerebellar infarct. Vascular: There is mild atherosclerotic calcification at the skull base. Skull: Calvarium is unremarkable. Sinuses/Orbits: No acute finding. Other: None. IMPRESSION: No evidence of acute intracranial injury. Mild chronic microvascular ischemic changes. Possible small age-indeterminate right cerebellar infarct. Electronically Signed   By: Macy Mis M.D.   On: 03/07/2022 16:07   DG Shoulder Left  Result Date: 03/07/2022 CLINICAL DATA:  Fall EXAM: LEFT SHOULDER - 2+ VIEW COMPARISON:  None Available. FINDINGS: Fracture left humeral neck with mild displacement. Normal alignment. AC  joint intact. IMPRESSION: Mildly displaced fracture left humeral neck. Electronically Signed   By: Franchot Gallo M.D.   On: 03/07/2022 14:39   DG Humerus Left  Result Date: 03/07/2022 CLINICAL DATA:  Fall.  Left shoulder pain EXAM: LEFT HUMERUS - 2+ VIEW COMPARISON:  03/07/2022 FINDINGS: Fracture proximal left humerus through the humeral neck with mild displacement. No other fracture of the humerus. Shoulder normal alignment. IMPRESSION: Mildly displaced fracture proximal left humeral neck. Electronically Signed   By: Franchot Gallo M.D.   On: 03/07/2022 14:38    Procedures Procedures    Medications Ordered in ED Medications  morphine (PF) 4 MG/ML injection 4 mg (4 mg Intravenous Given 03/07/22 1304)    ED Course/ Medical Decision Making/ A&P                           Medical Decision Making Amount and/or Complexity of Data Reviewed Radiology: ordered.  Risk OTC drugs. Prescription drug management.    Medical  Screen Complete  This patient presented to the ED with complaint of fall, left shoulder pain.  This complaint involves an extensive number of treatment options. The initial differential diagnosis includes, but is not limited to, trauma related to fall  This presentation is: Acute, Self-Limited, Previously Undiagnosed, Uncertain Prognosis, Complicated, Systemic Symptoms, and Threat to Life/Bodily Function  Patient presents after mechanical fall.  She did strike her head.  She complains primarily of pain to the left shoulder.  Imaging reveals minimally displaced left proximal humerus fracture.  Sling applied.  CT of head is without significant abnormality.  At time of discharge patient feels improved.  She understands plan of care for outpatient follow-up with orthopedics.  She has established orthopedic care with [facility].  She plans on talking with them tomorrow about her injury today.  Patient will be prescribed oxycodone for home use.  She reports that she has had no problems with oxycodone in the past.  She request stool softener for constipation prevention.  Strict return precautions given and understood.  Additional history obtained:  External records from outside sources obtained and reviewed including prior ED visits and prior Inpatient records.    Imaging Studies ordered:  I ordered imaging studies including CT head, plain films of left shoulder and left humerus I independently visualized and interpreted obtained imaging which showed left proximal humerus fracture I agree with the radiologist interpretation.   Cardiac Monitoring:  The patient was maintained on a cardiac monitor.  I personally viewed and interpreted the cardiac monitor which showed an underlying rhythm of: NSR   Medicines ordered:  I ordered medication including morphine, Dilaudid for pain Reevaluation of the patient after these medicines showed that the patient: improved    Problem List / ED  Course:  Fall, left proximal humerus fracture, head injury   Reevaluation:  After the interventions noted above, I reevaluated the patient and found that they have: improved  Disposition:  After consideration of the diagnostic results and the patients response to treatment, I feel that the patent would benefit from close outpatient followup.          Final Clinical Impression(s) / ED Diagnoses Final diagnoses:  Closed fracture of proximal end of left humerus, unspecified fracture morphology, initial encounter    Rx / DC Orders ED Discharge Orders     None         Valarie Merino, MD 03/07/22 1626

## 2022-03-08 DIAGNOSIS — S42202A Unspecified fracture of upper end of left humerus, initial encounter for closed fracture: Secondary | ICD-10-CM | POA: Insufficient documentation

## 2022-04-04 LAB — VITAMIN D 25 HYDROXY (VIT D DEFICIENCY, FRACTURES): Vit D, 25-Hydroxy: 45.7 ng/mL (ref 30.0–100.0)

## 2022-04-04 LAB — LIPID PANEL WITH LDL/HDL RATIO
Cholesterol, Total: 102 mg/dL (ref 100–199)
HDL: 43 mg/dL (ref >39)
LDL Chol Calc (NIH): 34 mg/dL (ref 0–99)
LDL/HDL Ratio: 0.8 ratio (ref 0.0–3.2)
Triglycerides: 146 mg/dL (ref 0–149)
VLDL Cholesterol Cal: 25 mg/dL (ref 5–40)

## 2022-04-05 ENCOUNTER — Ambulatory Visit: Payer: Medicare Other | Admitting: Cardiology

## 2022-04-07 ENCOUNTER — Encounter: Payer: Self-pay | Admitting: Cardiology

## 2022-04-07 ENCOUNTER — Ambulatory Visit: Payer: Medicare Other | Admitting: Cardiology

## 2022-04-07 VITALS — BP 130/75 | HR 61 | Temp 97.2°F | Resp 16 | Ht 62.0 in | Wt 169.6 lb

## 2022-04-07 DIAGNOSIS — I2510 Atherosclerotic heart disease of native coronary artery without angina pectoris: Secondary | ICD-10-CM

## 2022-04-07 DIAGNOSIS — I10 Essential (primary) hypertension: Secondary | ICD-10-CM

## 2022-04-07 DIAGNOSIS — E782 Mixed hyperlipidemia: Secondary | ICD-10-CM

## 2022-04-07 DIAGNOSIS — E1122 Type 2 diabetes mellitus with diabetic chronic kidney disease: Secondary | ICD-10-CM

## 2022-04-07 MED ORDER — EZETIMIBE 10 MG PO TABS: 10.0000 mg | ORAL_TABLET | Freq: Every day | ORAL | 3 refills | Status: DC

## 2022-04-07 MED ORDER — ROSUVASTATIN CALCIUM 5 MG PO TABS: 5.0000 mg | ORAL_TABLET | Freq: Every day | ORAL | 3 refills | Status: DC

## 2022-04-07 NOTE — Progress Notes (Signed)
Primary Physician/Referring:  Tereasa Coop, PA-C  Patient ID: Alison Friedman, female    DOB: 06/09/1946, 76 y.o.   MRN: [id number]  Chief Complaint  Patient presents with   Hyperlipidemia   Coronary Artery Disease   HPI:    Alison Friedman  is a 76 y.o. Caucasian female with severe statin intolerance and hyperlipidemia, DM Controlled and hypertension, mild coronary atheresclerosis (mid LAD 30%).  She could not tolerate any of the statins however she was started on  5 mg of Crestor however due to LDL not being at goal.  Hence she was started on Zetia.  She is tolerating this well and presents here for routine visit for follow-up of hyperlipidemia and coronary atherosclerosis.  She remains asymptomatic from cardiac standpoint.  She is tolerating all medications well without any myalgias. Past Medical History:  Diagnosis Date   Abnormal CT scan, chest    stable   Allergy    Anxiety    Asthma    Asthma    Cervical disc disease    Diabetes mellitus    Diabetes mellitus without complication (HCC)    Discoid lupus    Gallbladder polyp    Hyperlipidemia    Hypertension    Lymphadenopathy    Postmenopausal HRT (hormone replacement therapy)    Pulmonary embolism ([place]) At age 28    With associated DVT   Renal cyst    Shingles 12/2010   Past Surgical History:  Procedure Laterality Date   APPENDECTOMY     child   APPENDECTOMY     BREAST SURGERY     INTRACAPSULAR CATARACT EXTRACTION Right 09/14/2020   LAPAROSCOPIC HELLER MYOTOMY     NECK SURGERY     Disk and rod   SPINE SURGERY  2004   c spine   TONSILLECTOMY AND ADENOIDECTOMY     TUBAL LIGATION     Social History   Tobacco Use   Smoking status: Former    Packs/day: 0.25    Years: 35.00    Total pack years: 8.75    Types: Cigarettes    Quit date: 2018    Years since quitting: 5.7   Smokeless tobacco: Former   Tobacco comments:    Encouraged to remain smoke free.  Substance Use Topics   Alcohol use: Not Currently     Alcohol/week: 0.0 standard drinks of alcohol   Marital Status: Married   ROS  Review of Systems  Cardiovascular:  Negative for chest pain, dyspnea on exertion and leg swelling.   Objective      04/07/2022   11:49 AM 03/07/2022    4:30 PM 03/07/2022    4:23 PM  Vitals with BMI  Height '5\' 2"'     Weight 169 lbs 10 oz    BMI 08.14    Systolic 481 856 314  Diastolic 75 69 61  Pulse 61 67 64    Blood pressure 130/75, pulse 61, temperature (!) 97.2 F (36.2 C), temperature source Temporal, resp. rate 16, height '5\' 2"'  (1.575 m), weight 169 lb 9.6 oz (76.9 kg), SpO2 98 %. Body mass index is 31.02 kg/m.   Physical Exam Neck:     Vascular: No carotid bruit or JVD.  Cardiovascular:     Rate and Rhythm: Normal rate and regular rhythm.     Pulses: Intact distal pulses.     Heart sounds: Normal heart sounds. No murmur heard.    No gallop.  Pulmonary:     Effort: Pulmonary effort is normal.  Breath sounds: Normal breath sounds.  Abdominal:     General: Bowel sounds are normal.     Palpations: Abdomen is soft.  Musculoskeletal:     Right lower leg: No edema.     Left lower leg: No edema.    Radiology: No results found.  Laboratory examination:    External labs:  Lab Results  Component Value Date   CHOL 102 04/03/2022   HDL 43 04/03/2022   LDLCALC 34 04/03/2022   TRIG 146 04/03/2022   CHOLHDL 5.3 (H) 06/19/2019    Lab Results  Component Value Date   NA 135 03/02/2021   K 3.8 03/02/2021   CO2 24 03/02/2021   GLUCOSE 121 (H) 03/02/2021   BUN 19 03/02/2021   CREATININE 1.31 (H) 03/02/2021   CALCIUM 10.1 03/02/2021   GFRNONAA 42 (L) 03/02/2021    Last vitamin D Lab Results  Component Value Date   VD25OH 45.7 04/03/2022    Labs 08/30/2021:  Total cholesterol 152, triglycerides 158, HDL 39, LDL 81.  Labs 03/10/2021:  A1c 7.1%.  BUN 24, creatinine 1.3, EGFR 43 mL, potassium 4.8  Medications   Allergies  Allergen Reactions   Nitrofurantoin Monohyd Macro  Anaphylaxis   Penicillins Anaphylaxis   Shellfish Allergy Anaphylaxis   Geralyn Flash [Fish Allergy] Anaphylaxis   Clarithromycin Other (See Comments)    Face turns red.   Crestor [Rosuvastatin]    Food     tuna   Iodinated Contrast Media     Prev CT reports state anaphylaxis with IV contrast, pt also states severe reaction with contrast   Levofloxacin    Levofloxacin Other (See Comments)    Face turned red.   Lipitor [Atorvastatin Calcium]    Lipitor [Atorvastatin Calcium] Other (See Comments)    Muscle pain   Macrolides And Ketolides    Nitrofurantoin Monohyd Macro    Penicillins    Percocet [Oxycodone-Acetaminophen]    Percocet [Oxycodone-Acetaminophen] Swelling   Sulfa Antibiotics    Sulfa Antibiotics Swelling   Tuna [Fish Allergy] Hives   Wellbutrin [Bupropion] Other (See Comments)    Made essential tremors worse     Current Outpatient Medications:    albuterol (VENTOLIN HFA) 108 (90 Base) MCG/ACT inhaler, INHALE 2 PUFFS BY MOUTH EVERY 4 HOURS AS NEEDED FOR WHEEZING OR  SHORTNESS  OF  BREATH, Disp: 18 g, Rfl: 0   ALPRAZolam (XANAX) 0.25 MG tablet, Take 0.5-1 tablets (0.125-0.25 mg total) by mouth daily as needed for anxiety., Disp: 30 tablet, Rfl: 1   Cholecalciferol (VITAMIN D) 125 MCG (5000 UT) CAPS, Take 5,000 Units by mouth daily. , Disp: , Rfl:    Clobetasol Prop Emollient Base (CLOBETASOL PROPIONATE E) 0.05 % emollient cream, Apple to affected genitalia region at bedtime once a week, Disp: 30 g, Rfl: 0   dicyclomine (BENTYL) 20 MG tablet, Take 20 mg by mouth as needed., Disp: , Rfl:    docusate sodium (COLACE) 100 MG capsule, Take 1 capsule (100 mg total) by mouth every 12 (twelve) hours., Disp: 60 capsule, Rfl: 0   Dulaglutide (TRULICITY) 3.97 QB/3.4LP SOPN, Inject 0.75 mg into the skin once a week., Disp: , Rfl:    ezetimibe (ZETIA) 10 MG tablet, Take 1 tablet (10 mg total) by mouth daily after supper., Disp: 90 tablet, Rfl: 0   fluticasone (FLONASE) 50 MCG/ACT nasal spray,  USE TWO SPRAY(S) IN EACH NOSTRIL ONCE DAILY, Disp: 16 g, Rfl: 4   glucose blood (ONETOUCH VERIO) test strip, Use as instructed to check blood  sugar once a day at various times-Dx code E11.9, Disp: 50 each, Rfl: 3   metFORMIN (GLUCOPHAGE-XR) 500 MG 24 hr tablet, Take 1 tablet (500 mg total) by mouth 2 (two) times daily. (Patient taking differently: Take 500 mg by mouth daily with breakfast.), Disp: 180 tablet, Rfl: 1   omeprazole (PRILOSEC) 20 MG capsule, Take 20 mg by mouth daily., Disp: , Rfl:    rosuvastatin (CRESTOR) 5 MG tablet, Take 1 tablet (5 mg total) by mouth daily., Disp: 90 tablet, Rfl: 0   telmisartan (MICARDIS) 40 MG tablet, Take 1 tablet (40 mg total) by mouth daily., Disp: 90 tablet, Rfl: 3   traMADol (ULTRAM) 50 MG tablet, Take 50 mg by mouth every 8 (eight) hours as needed., Disp: , Rfl:     Cardiac Studies:   Exercise Myoview stress test 11/21/2013: 1. Resting EKG showed normal sinus rhythm, poor R wave progression, low voltage complexes. Stress EKG was negative for ischemia.  Patient exercised on BRUCE PROTOCOL for 4 minutes 34 seconds. The maximum work level achieved was 7.3 MET's. Hypertensive BP response. The baseline blood pressure was 140/90 mmHg and 208/102 mmHg with exercise. The test was terminated due to achievement of the target heart rate, fatigue and dizziness. 2. Perfusion imaging study demonstrates normal perfusion without ischemia or scar. Normal left ventricular ejection fraction at 69% Impression: EKG 04/08/2018: Normal sinus rhythm at rate of 74 bpm, normal axis, right bundle branch block. No evidence of ischemia. [place] EKG 05/30/2017.  Coronary angiogram 07/20/2005: LAD had a 25% stenosis, otherwise normal coronary arteries. Ejection fraction was normal.  EKG   EKG 01/27/2022: Normal sinus rhythm at rate of 60 bpm, left atrial enlargement, right bundle branch block.  Low-voltage complexes.  No significant change from 04/08/2020.  Assessment     ICD-10-CM   1.  Mixed hyperlipidemia  E78.2     2. Essential hypertension  I10     3. Controlled type 2 diabetes mellitus with stage 3 chronic kidney disease, without long-term current use of insulin (HCC)  E11.22    N18.30       Medications Discontinued During This Encounter  Medication Reason   oxyCODONE (ROXICODONE) 5 MG immediate release tablet      No orders of the defined types were placed in this encounter.   Recommendations:   Cynthya Yam  is a 76 y.o. Caucasian female with severe statin intolerance and hyperlipidemia, DM Controlled and hypertension, mild coronary atheresclerosis (mid LAD 30%). She has not tolerated several statins and other lipid lowering agents in the past.  However she was started on 5 mg of Crestor and also Zetia combination, she has been a super responder and LDL is now back to normal.  She has not had any side effects from the same.  She remains asymptomatic from cardiac standpoint, no angina pectoris, blood pressure is well controlled.  Chronic kidney disease has remained stable at stage IIIa.  Diabetes is also much improved.  We have discussed regarding calorie reduction.  Otherwise no change in physical exam, I will see her back on a as needed basis.  I have refilled the prescription for Crestor 5 mg along with Zetia.  Labs reviewed, vitamin D is normal.   Adrian Prows, MD, [facility] 04/07/2022, 12:03 PM Office: [phone]

## 2022-11-23 ENCOUNTER — Ambulatory Visit
Admission: RE | Admit: 2022-11-23 | Discharge: 2022-11-23 | Disposition: A | Discharge status: Home / Self Care | Payer: Medicare Other | Source: Ambulatory Visit | Attending: Physician Assistant | Admitting: Physician Assistant

## 2022-11-23 DIAGNOSIS — Z122 Encounter for screening for malignant neoplasm of respiratory organs: Secondary | ICD-10-CM

## 2022-11-23 DIAGNOSIS — Z87891 Personal history of nicotine dependence: Secondary | ICD-10-CM

## 2023-02-01 ENCOUNTER — Telehealth: Payer: Self-pay | Admitting: *Deleted

## 2023-02-01 DIAGNOSIS — R911 Solitary pulmonary nodule: Secondary | ICD-10-CM

## 2023-02-01 DIAGNOSIS — Z87891 Personal history of nicotine dependence: Secondary | ICD-10-CM

## 2023-02-01 NOTE — Telephone Encounter (Signed)
Lung screening CT from 11/23/22 was reviewed by Kandice Robinsons, NP. She has recommended a 6 mth nodule follow up CT to look at the nodule that has decreased and increased in size. PT is 77 yrs old so will no longer qualify next year for annual scan. Recommendation is for 6 month nodule f/u CT for final scan. Please advise patient.

## 2023-02-02 NOTE — Telephone Encounter (Signed)
Left message for pt to call back to discuss CT results.  ?

## 2023-02-05 NOTE — Telephone Encounter (Signed)
Spoke with pt and advised of CT results and recommendations for 6 month follow up CT. Pt verbalized understanding. Order placed for 6 month nodule follow up CT.

## 2023-04-02 DIAGNOSIS — M25561 Pain in right knee: Secondary | ICD-10-CM | POA: Insufficient documentation

## 2023-04-10 ENCOUNTER — Ambulatory Visit: Payer: Medicare Other | Admitting: Dermatology

## 2023-04-10 ENCOUNTER — Encounter: Payer: Self-pay | Admitting: Dermatology

## 2023-04-10 VITALS — BP 108/71 | HR 61

## 2023-04-10 DIAGNOSIS — L280 Lichen simplex chronicus: Secondary | ICD-10-CM

## 2023-04-10 DIAGNOSIS — L299 Pruritus, unspecified: Secondary | ICD-10-CM

## 2023-04-10 DIAGNOSIS — L281 Prurigo nodularis: Secondary | ICD-10-CM | POA: Diagnosis not present

## 2023-04-10 MED ORDER — CLOBETASOL PROPIONATE 0.05 % EX CREA
1.0000 | TOPICAL_CREAM | Freq: Two times a day (BID) | CUTANEOUS | 3 refills | Status: AC
Start: 2023-04-10 — End: ?

## 2023-04-10 MED ORDER — TRIAMCINOLONE ACETONIDE 10 MG/ML IJ SUSP
10.0000 mg | Freq: Once | INTRAMUSCULAR | Status: AC
Start: 2023-04-10 — End: 2023-04-10
  Administered 2023-04-10: 10 mg

## 2023-04-10 NOTE — Progress Notes (Signed)
   New Patient Visit   Subjective  Alison Friedman is a 77 y.o. female who presents for the following: Acne  Patient states she has acne located at the back that she would like to have examined. Patient reports the areas have been there for 1 year. She reports the areas are bothersome.Patient rates irritation 10 out of 10. She states that the areas have not spread. Patient reports she has not previously been treated for these areas. Patient denies Hx of bx. Patient denies family history of skin cancer(s).  The patient has spots, moles and lesions to be evaluated, some may be new or changing and the patient may have concern these could be cancer.   The following portions of the chart were reviewed this encounter and updated as appropriate: medications, allergies, medical history  Review of Systems:  No other skin or systemic complaints except as noted in HPI or Assessment and Plan.  Objective  Well appearing patient in no apparent distress; mood and affect are within normal limits.  A focused examination was performed of the following areas: Back  Relevant exam findings are noted in the Assessment and Plan.          Assessment & Plan   PRURIGO NODULARIS/LICHEN SIMPLEX CHRONICUS Exam: Excoriated papules and nodules at posterior and upper back  Flared  Lichen simplex chronicus (LSC) is a persistent itchy area of thickened skin that is induced by chronic rubbing and/or scratching (chronic dermatitis).  These areas may be pink, hyperpigmented and may have excoriations and bumps (prurigo nodules-PN).  PN/LSC is commonly observed in uncontrolled atopic dermatitis and other forms of eczema, and in other itchy skin conditions (eg, insect bites, scabies).  Sometimes it is not possible to know initial cause of PN/LSC if it has been present for a long time.  It generally responds well to treatment with high potency topical steroids.  It is important to stop rubbing/scratching the area in order to  break the itch-scratch-rash-itch cycle, in order for the rash to resolve.   Treatment Plan: -  Avoid picking/rubbing/scratching   Pruritus Exam: Pt exhibits active itching and excoriations   Treatment Plan: - We will prescribe Clobetasol Cream to apply to effected areas 2 times a day for 2 weeks then STOP  Return in about 3 months (around 07/10/2023) for Prurigo Nodularis and TBSE.  Documentation: I have reviewed the above documentation for accuracy and completeness, and I agree with the above.  Stasia Cavalier, am acting as scribe for Langston Reusing, DO.  Langston Reusing, DO

## 2023-04-10 NOTE — Patient Instructions (Addendum)

## 2023-05-06 ENCOUNTER — Other Ambulatory Visit: Payer: Self-pay | Admitting: Cardiology

## 2023-05-06 DIAGNOSIS — I2583 Coronary atherosclerosis due to lipid rich plaque: Secondary | ICD-10-CM

## 2023-05-06 DIAGNOSIS — E782 Mixed hyperlipidemia: Secondary | ICD-10-CM

## 2023-05-28 ENCOUNTER — Other Ambulatory Visit: Payer: Medicare Other

## 2023-06-04 ENCOUNTER — Ambulatory Visit
Admission: RE | Admit: 2023-06-04 | Discharge: 2023-06-04 | Disposition: A | Discharge status: Home / Self Care | Payer: Medicare Other | Source: Ambulatory Visit | Attending: Acute Care | Admitting: Acute Care

## 2023-06-04 DIAGNOSIS — R911 Solitary pulmonary nodule: Secondary | ICD-10-CM

## 2023-06-04 DIAGNOSIS — Z87891 Personal history of nicotine dependence: Secondary | ICD-10-CM

## 2023-06-06 ENCOUNTER — Other Ambulatory Visit: Payer: Self-pay | Admitting: Cardiology

## 2023-06-06 DIAGNOSIS — E782 Mixed hyperlipidemia: Secondary | ICD-10-CM

## 2023-06-06 DIAGNOSIS — I2583 Coronary atherosclerosis due to lipid rich plaque: Secondary | ICD-10-CM

## 2023-06-11 ENCOUNTER — Other Ambulatory Visit: Payer: Self-pay | Admitting: Cardiology

## 2023-06-11 DIAGNOSIS — I2583 Coronary atherosclerosis due to lipid rich plaque: Secondary | ICD-10-CM

## 2023-06-11 DIAGNOSIS — E782 Mixed hyperlipidemia: Secondary | ICD-10-CM

## 2023-06-28 ENCOUNTER — Other Ambulatory Visit: Payer: Self-pay

## 2023-06-28 DIAGNOSIS — Z122 Encounter for screening for malignant neoplasm of respiratory organs: Secondary | ICD-10-CM

## 2023-06-28 DIAGNOSIS — Z87891 Personal history of nicotine dependence: Secondary | ICD-10-CM

## 2023-07-05 ENCOUNTER — Encounter: Payer: Self-pay | Admitting: Dermatology

## 2023-07-05 ENCOUNTER — Ambulatory Visit: Payer: Medicare Other | Admitting: Dermatology

## 2023-07-05 DIAGNOSIS — D229 Melanocytic nevi, unspecified: Secondary | ICD-10-CM

## 2023-07-05 DIAGNOSIS — L821 Other seborrheic keratosis: Secondary | ICD-10-CM

## 2023-07-05 DIAGNOSIS — L814 Other melanin hyperpigmentation: Secondary | ICD-10-CM

## 2023-07-05 DIAGNOSIS — L281 Prurigo nodularis: Secondary | ICD-10-CM | POA: Diagnosis not present

## 2023-07-05 DIAGNOSIS — Z1283 Encounter for screening for malignant neoplasm of skin: Secondary | ICD-10-CM | POA: Diagnosis not present

## 2023-07-05 DIAGNOSIS — D1801 Hemangioma of skin and subcutaneous tissue: Secondary | ICD-10-CM | POA: Diagnosis not present

## 2023-07-05 DIAGNOSIS — W908XXA Exposure to other nonionizing radiation, initial encounter: Secondary | ICD-10-CM

## 2023-07-05 DIAGNOSIS — L578 Other skin changes due to chronic exposure to nonionizing radiation: Secondary | ICD-10-CM

## 2023-07-05 NOTE — Progress Notes (Signed)
   Follow-Up Visit   Subjective  Alison Friedman is a 77 y.o. female who presents for the following: Prurigo Nodularis - TBSE  Patient present today for follow up visit for Prurigo Nodularis. Patient was last evaluated on 04/10/23. Was Rx Clobetasol Cream - use BID for 2 weeks then stop. She stated that the areas have improved greatly but not resolved. Patient reports sxs are better. Patient denies medication changes.  The following portions of the chart were reviewed this encounter and updated as appropriate: medications, allergies, medical history  Review of Systems:  No other skin or systemic complaints except as noted in HPI or Assessment and Plan.  Objective  Well appearing patient in no apparent distress; mood and affect are within normal limits.   A focused examination was performed of the following areas: TBSE   Relevant exam findings are noted in the Assessment and Plan.     Assessment & Plan   LENTIGINES, SEBORRHEIC KERATOSES, HEMANGIOMAS - Benign normal skin lesions - Benign-appearing - Call for any changes  BENIGN MELANOCYTIC NEVI - Tan-brown and/or pink-flesh-colored symmetric macules and papules - Benign appearing on exam today - Observation - Call clinic for new or changing moles - Recommend daily use of broad spectrum spf 30+ sunscreen to sun-exposed areas.   MILD ACTINIC DAMAGE - Chronic condition, secondary to cumulative UV/sun exposure - diffuse scaly erythematous macules with underlying dyspigmentation - Recommend daily broad spectrum sunscreen SPF 30+ to sun-exposed areas, reapply every 2 hours as needed.  - Staying in the shade or wearing long sleeves, sun glasses (UVA+UVB protection) and wide brim hats (4-inch brim around the entire circumference of the hat) are also recommended for sun protection.  - Call for new or changing lesions.   Pruritic Nodules - Assessment: Patient has been using clobetasol ointment for pruritic nodules, which has  significantly improved them but not fully resolved it. The itching returns after about a week when not using the medication. CeraVe Anti-Itch cream provides some relief. One nodule still has some substance to it and requires continued treatment. - Plan:  Continue clobetasol ointment treatment, focusing on the remaining nodule. Maintain 2-week breaks from clobetasol use. Use CeraVe Anti-Itch cream as needed. Consider systemic medication as a future alternative if symptoms worsen. Massage clobetasol into affected areas when urge to scratch occurs.   Follow up in one year, or sooner if new lesions develop. Refill clobetasol prescription as needed via MyChart.   SKIN CANCER SCREENING PERFORMED TODAY     No follow-ups on file.    Documentation: I have reviewed the above documentation for accuracy and completeness, and I agree with the above.  I, Shirron Marcha Solders, CMA, am acting as scribe for [employer], DO.   Langston Reusing, DO

## 2023-07-05 NOTE — Patient Instructions (Addendum)

## 2023-07-19 ENCOUNTER — Other Ambulatory Visit: Payer: Self-pay | Admitting: Cardiology

## 2023-07-19 DIAGNOSIS — I2583 Coronary atherosclerosis due to lipid rich plaque: Secondary | ICD-10-CM

## 2023-07-19 DIAGNOSIS — E782 Mixed hyperlipidemia: Secondary | ICD-10-CM

## 2023-07-23 ENCOUNTER — Other Ambulatory Visit: Payer: Self-pay | Admitting: Cardiology

## 2023-07-23 DIAGNOSIS — I2583 Coronary atherosclerosis due to lipid rich plaque: Secondary | ICD-10-CM

## 2023-07-23 DIAGNOSIS — E782 Mixed hyperlipidemia: Secondary | ICD-10-CM

## 2023-08-02 ENCOUNTER — Other Ambulatory Visit: Payer: Self-pay

## 2023-08-02 DIAGNOSIS — I2583 Coronary atherosclerosis due to lipid rich plaque: Secondary | ICD-10-CM

## 2023-08-02 DIAGNOSIS — E782 Mixed hyperlipidemia: Secondary | ICD-10-CM

## 2024-04-30 ENCOUNTER — Encounter: Payer: Self-pay | Admitting: Acute Care

## 2024-06-05 ENCOUNTER — Encounter: Payer: Self-pay | Admitting: Physician Assistant

## 2024-06-05 ENCOUNTER — Ambulatory Visit: Admitting: Dermatology

## 2024-06-05 ENCOUNTER — Ambulatory Visit (INDEPENDENT_AMBULATORY_CARE_PROVIDER_SITE_OTHER): Admitting: Physician Assistant

## 2024-06-05 DIAGNOSIS — L858 Other specified epidermal thickening: Secondary | ICD-10-CM | POA: Diagnosis not present

## 2024-06-05 DIAGNOSIS — D485 Neoplasm of uncertain behavior of skin: Secondary | ICD-10-CM

## 2024-06-05 DIAGNOSIS — R202 Paresthesia of skin: Secondary | ICD-10-CM

## 2024-06-05 DIAGNOSIS — L280 Lichen simplex chronicus: Secondary | ICD-10-CM | POA: Diagnosis not present

## 2024-06-05 MED ORDER — HYDROCORTISONE 2.5 % EX OINT: TOPICAL_OINTMENT | Freq: Two times a day (BID) | CUTANEOUS | 5 refills | Status: DC

## 2024-06-05 NOTE — Patient Instructions (Addendum)

## 2024-06-05 NOTE — Progress Notes (Signed)
   Follow-Up Visit   Subjective  Alison Friedman is a 78 y.o. female established patient who presents for FOLLOW UP on the diagnoses listed below:  Patient was last evaluated on 04/10/23 by Dr. Delon Lenis.   Prurigo Nodularis/LSC: Pt has been applying clobetasol  to the upper back BID for 2 weeks on, 2 weeks off. However, she stated on the break off of steroid topical her Sx returns rating the itch 6-7/10. There is one raised spot that is really really itchy. She has continued to use the CeraVe Anti-Itch lotion daily and she stated that it has also been helping her control the itch. Pt would like something to apply to the back on the weeks that she is not using the steroid.    The following portions of the chart were reviewed this encounter and updated as appropriate: medications, allergies, medical history  Review of Systems:  No other skin or systemic complaints except as noted in HPI or Assessment and Plan.  Objective  Well appearing patient in no apparent distress; mood and affect are within normal limits.   A focused examination was performed of the following areas: upper back   Relevant exam findings are noted in the Assessment and Plan.      Left Upper Back 7 mm pink papule  Assessment & Plan   NOTALGIA PARESTHETICA  / LICHEN SIMPLEX CHRONICUS Exam: hyperpigmented patch  Improved   Treatment Plan: - Continue applying clobetasol  BID 2 weeks on, 2 weeks off - Continue using CeraVe anti itch lotion daily - Start Sarna lotion on 2 weeks off of clobetasol .     NEOPLASM OF UNCERTAIN BEHAVIOR OF SKIN Left Upper Back Skin / nail biopsy Type of biopsy: tangential   Informed consent: discussed and consent obtained   Timeout: patient name, date of birth, surgical site, and procedure verified   Procedure prep:  Patient was prepped and draped in usual sterile fashion Prep type:  Isopropyl alcohol Anesthesia: the lesion was anesthetized in a standard fashion   Anesthetic:   1% lidocaine w/ epinephrine  1-100,000 buffered w/ 8.4% NaHCO3 Instrument used: DermaBlade   Hemostasis achieved with: aluminum chloride   Outcome: patient tolerated procedure well   Post-procedure details: sterile dressing applied and wound care instructions given   Dressing type: petrolatum gauze and bandage    Specimen 1 - Surgical pathology Differential Diagnosis: r/o SCC v ISK  Check Margins: yes NOTALGIA PARESTHETICA   LICHEN SIMPLEX CHRONICUS    Return for keep jan OV with JD.   Documentation: I have reviewed the above documentation for accuracy and completeness, and I agree with the above.  I, Shirron Maranda, CMA, am acting as scribe for:  Anginette Espejo K, PA-C

## 2024-06-06 LAB — SURGICAL PATHOLOGY

## 2024-06-08 ENCOUNTER — Ambulatory Visit: Payer: Self-pay | Admitting: Physician Assistant

## 2024-07-24 ENCOUNTER — Ambulatory Visit: Payer: Medicare Other | Admitting: Dermatology
